# Patient Record
Sex: Male | Born: 1950 | Race: White | Hispanic: No | Smoking: Light tobacco smoker
Health system: Southern US, Community
[De-identification: ages and names within clinical notes are randomized; demographics above are authoritative.]

## PROBLEM LIST (undated history)

## (undated) DIAGNOSIS — E785 Hyperlipidemia, unspecified: Secondary | ICD-10-CM

## (undated) DIAGNOSIS — C801 Malignant (primary) neoplasm, unspecified: Secondary | ICD-10-CM

## (undated) DIAGNOSIS — M545 Low back pain, unspecified: Secondary | ICD-10-CM

## (undated) DIAGNOSIS — F419 Anxiety disorder, unspecified: Secondary | ICD-10-CM

## (undated) DIAGNOSIS — J449 Chronic obstructive pulmonary disease, unspecified: Secondary | ICD-10-CM

## (undated) DIAGNOSIS — K219 Gastro-esophageal reflux disease without esophagitis: Secondary | ICD-10-CM

## (undated) DIAGNOSIS — G8929 Other chronic pain: Secondary | ICD-10-CM

## (undated) DIAGNOSIS — F5104 Psychophysiologic insomnia: Secondary | ICD-10-CM

## (undated) DIAGNOSIS — Z89511 Acquired absence of right leg below knee: Secondary | ICD-10-CM

## (undated) HISTORY — DX: Gastro-esophageal reflux disease without esophagitis: K21.9

## (undated) HISTORY — DX: Hyperlipidemia, unspecified: E78.5

---

## 2004-05-30 ENCOUNTER — Ambulatory Visit (HOSPITAL_COMMUNITY): Admission: RE | Admit: 2004-05-30 | Discharge: 2004-05-30 | Payer: Self-pay | Admitting: *Deleted

## 2005-12-25 ENCOUNTER — Ambulatory Visit (HOSPITAL_COMMUNITY): Admission: RE | Admit: 2005-12-25 | Discharge: 2005-12-25 | Payer: Self-pay | Admitting: Family Medicine

## 2006-01-29 ENCOUNTER — Ambulatory Visit (HOSPITAL_COMMUNITY): Admission: RE | Admit: 2006-01-29 | Discharge: 2006-01-29 | Payer: Self-pay | Admitting: Family Medicine

## 2006-02-04 ENCOUNTER — Ambulatory Visit (HOSPITAL_COMMUNITY): Admission: RE | Admit: 2006-02-04 | Discharge: 2006-02-04 | Payer: Self-pay | Admitting: Family Medicine

## 2006-02-12 ENCOUNTER — Ambulatory Visit (HOSPITAL_COMMUNITY): Admission: RE | Admit: 2006-02-12 | Discharge: 2006-02-12 | Payer: Self-pay | Admitting: Pulmonary Disease

## 2006-04-15 ENCOUNTER — Inpatient Hospital Stay (HOSPITAL_COMMUNITY): Admission: AD | Admit: 2006-04-15 | Discharge: 2006-04-17 | Payer: Self-pay | Admitting: Family Medicine

## 2006-04-16 ENCOUNTER — Ambulatory Visit: Payer: Self-pay | Admitting: Internal Medicine

## 2006-05-02 ENCOUNTER — Ambulatory Visit (HOSPITAL_COMMUNITY): Admission: RE | Admit: 2006-05-02 | Discharge: 2006-05-02 | Payer: Self-pay | Admitting: Family Medicine

## 2007-03-07 ENCOUNTER — Ambulatory Visit (HOSPITAL_COMMUNITY): Admission: RE | Admit: 2007-03-07 | Discharge: 2007-03-07 | Payer: Self-pay | Admitting: Neurology

## 2007-04-30 ENCOUNTER — Emergency Department (HOSPITAL_COMMUNITY): Admission: EM | Admit: 2007-04-30 | Discharge: 2007-04-30 | Payer: Self-pay | Admitting: Emergency Medicine

## 2008-04-08 ENCOUNTER — Ambulatory Visit: Payer: Self-pay | Admitting: Internal Medicine

## 2008-07-07 ENCOUNTER — Encounter (INDEPENDENT_AMBULATORY_CARE_PROVIDER_SITE_OTHER): Payer: Self-pay | Admitting: Urology

## 2008-08-02 ENCOUNTER — Ambulatory Visit: Payer: Self-pay | Admitting: Internal Medicine

## 2009-07-03 ENCOUNTER — Encounter: Payer: Self-pay | Admitting: Emergency Medicine

## 2009-07-03 ENCOUNTER — Inpatient Hospital Stay (HOSPITAL_COMMUNITY): Admission: EM | Admit: 2009-07-03 | Discharge: 2009-07-05 | Payer: Self-pay | Admitting: General Surgery

## 2009-08-05 ENCOUNTER — Ambulatory Visit (HOSPITAL_COMMUNITY): Admission: RE | Admit: 2009-08-05 | Discharge: 2009-08-05 | Payer: Self-pay | Admitting: Family Medicine

## 2011-03-10 LAB — COMPREHENSIVE METABOLIC PANEL
AST: 19 U/L (ref 0–37)
Albumin: 4 g/dL (ref 3.5–5.2)
Calcium: 9.1 mg/dL (ref 8.4–10.5)
Creatinine, Ser: 0.75 mg/dL (ref 0.4–1.5)
GFR calc Af Amer: >60 mL/min (ref >60)
GFR calc non Af Amer: >60 mL/min (ref >60)
Total Protein: 7.9 g/dL (ref 6.0–8.3)

## 2011-03-10 LAB — CBC
MCHC: 35.8 g/dL (ref 30.0–36.0)
MCV: 98.5 fL (ref 78.0–100.0)
Platelets: 266 10*3/uL (ref 150–400)
RDW: 12.8 % (ref 11.5–15.5)

## 2011-03-10 LAB — URINALYSIS, ROUTINE W REFLEX MICROSCOPIC
Bilirubin Urine: NEGATIVE
Nitrite: NEGATIVE
Protein, ur: NEGATIVE mg/dL
Urobilinogen, UA: 0.2 mg/dL (ref 0.0–1.0)

## 2011-03-10 LAB — RAPID URINE DRUG SCREEN, HOSP PERFORMED
Amphetamines: NOT DETECTED
Barbiturates: NOT DETECTED
Benzodiazepines: POSITIVE — AB
Opiates: NOT DETECTED
Tetrahydrocannabinol: POSITIVE — AB

## 2011-03-10 LAB — DIFFERENTIAL
Eosinophils Relative: 3 % (ref 0–5)
Lymphocytes Relative: 40 % (ref 12–46)
Lymphs Abs: 3.9 10*3/uL (ref 0.7–4.0)
Monocytes Relative: 6 % (ref 3–12)

## 2011-03-11 LAB — CBC
Hemoglobin: 15.6 g/dL (ref 13.0–17.0)
MCHC: 35 g/dL (ref 30.0–36.0)
MCV: 99.8 fL (ref 78.0–100.0)
RBC: 4.46 MIL/uL (ref 4.22–5.81)

## 2011-03-11 LAB — BASIC METABOLIC PANEL
CO2: 25 mEq/L (ref 19–32)
Chloride: 104 mEq/L (ref 96–112)
Creatinine, Ser: 0.7 mg/dL (ref 0.4–1.5)
GFR calc Af Amer: >60 mL/min (ref >60)

## 2011-03-11 LAB — PROTIME-INR: Prothrombin Time: 14.4 seconds (ref 11.6–15.2)

## 2011-04-17 NOTE — H&P (Signed)
NAME:  [name], [name] NO.:  [id number]   MEDICAL RECORD NO.:  [id number]         PATIENT TYPE:  AMB   LOCATION:  DAY                           FACILITY:  [facility]   PHYSICIAN:  R. [name], M.D. DATE OF BIRTH:  12/14/1950   DATE OF ADMISSION:  DATE OF DISCHARGE:  LH                              HISTORY & PHYSICAL   REFERRING PHYSICIAN:  Scott A. Gerald Diss, MD   REASON FOR CONSULTATION:  Severe proctalgia/followup ulcerative/erosive  esophagitis.   HISTORY OF PRESENT ILLNESS:  [name] is a 60 year old Caucasian male  who we initially saw back in May 2007, when he had GI bleeding from  florid inflammatory changes of the distal one-half of the tubular  esophagus.  He was supposed to return for further evaluation of his  ulcerative and erosive esophagitis to ensure complete healing on PPI.  However, he did not return to our office.  We are also planning on doing  a screening colonoscopy as [name] had never had one and again, we  contacted him on August 07, 2006, and he told us that he did not want  to have the colonoscopy done at that time.  He was actually scheduled in  November 2007, but for whatever reason ever, but on October 01, 2006, he  called and cancelled the EGD and colonoscopy as he did not want to have  that done.  For the last 6 weeks, he has had severe proctalgia.  He  feels a pressure in his rectum as well as in his bladder.  It is  intermittent.  He tells me, he has a constant sensation of having to go  to the bathroom and have a bowel movement as well as urinate.  He has  been referred to Dr. Jerre Kim for prostatomegaly.  He is undergoing  further testing by him.  He tells me he has lost about 7 pounds in the  last 6 weeks.  He denies any diarrhea or constipation.  Denies any  abdominal pain.  Denies any rectal bleeding or melena.  Denies any  nausea, vomiting, or anorexia.  Denies any dysphagia, odynophagia, or  early satiety.  He denies any  fever or chills.  He does have occasional  heartburn and indigestion.  He is not on PPI at this time.  He notes his  GERD is worse when he consumes coffee.   PAST MEDICAL AND SURGICAL HISTORY:  1. He had hematemesis due to florid ulcerative, erosive, reflux      esophagitis of the distal one-half of his esophagus, which required      hospitalization on Apr 16, 2006.  2. He has a history of migraine headaches.  3. COPD.  4. Diabetes mellitus.  5. Hypertension.  6. 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  7. Chronic fatigue.  8. PTSD.  9. Prostatomegaly/prostate nodule being referred to Dr. Jerre Kim.  10.Insomnia.   CURRENT MEDICATIONS:  Pamelor 100 mg nightly, Xanax 0.5 mg q.i.d.,  Vicodin 5/500 mg p.r.n., Tylenol p.r.n.,  and Zestril 2.5 mg daily.   ALLERGIES:  ANTIHISTAMINES.   FAMILY HISTORY:  There is no known family history of carcinoma or other  chronic GI problems.  Mother has mental/psychiatric problems as well  as back problems.  Father deceased at 6 secondary to lung carcinoma and  diabetes mellitus.  He has 6 healthy sisters.   SOCIAL HISTORY:  [name] is divorced.  He has 2 healthy children.  He  is retired permanently disabled.  He has a 37-pack-year history of  tobacco use.  Denies any alcohol.  He has remote history of marijuana  use.   REVIEW OF SYSTEMS:  See HPI.  GU:  He does have increased urinary  frequency as well as dysuria, prostate nodule recently been evaluated by  Urology.  Otherwise, negative review of systems see HPI.   PHYSICAL EXAMINATION:  VITAL SIGNS:  Weight of 163 pounds, height 72  inches, temperature 97.8, blood pressure 100/70, and pulse 68.  GENERAL:  [name] is a few 60 year old Caucasian male who is alert,  oriented, pleasant, cooperative, in no acute distress.  HEENT:  Pupils equal, round, reactive to light.  Anicteric sclerae.  Conjunctivae pink.  Oropharynx pink and moist without  any lesions.  NECK:  Supple without thyromegaly.  CHEST:  Heart regular rate and rhythm.  Normal S1 and S2.  No murmurs,  clicks, rubs, or gallops.  LUNGS:  Clear to auscultation bilaterally.  ABDOMEN:  Positive bowel sounds x4.  No bruits auscultated.  Soft,  nontender, and nondistended without palpable mass or hepatosplenomegaly.  No rebound, tenderness, or guarding.  EXTREMITIES:  He has a left lower extremity without clubbing or edema.  He has a right below-the-knee amputation.  RECTAL:  No external lesions visualized.  Good sphincter tone.  He does  have marked prostatomegaly.  Small amount of stool was obtained from the  bowel, which is brown and Hemoccult negative.   IMPRESSION:  [name] is a 60 year old Caucasian male with severe  proctalgia.  Nothing on exam to explain his symptoms, other than marked  prostatomegaly, for which he is being followed by Urology.  Given his  history of severe ulcerative/erosive reflux esophagitis, he never had  followup and is due for repeat esophagogastroduodenoscopy to assess  healing.  He also needs a screening colonoscopy.   PLAN:  1. I have offered [name], colonoscopy and EGD at the same time by      Dr. Jena Kim at [facility].  I have gone over risks and      benefits, which include but not limited to bleeding, infection,      perforation, and drug reaction.  He does not want to further pursue      either procedure at this time.  He is very concerned about      colonoscopy prep.  He tells me, he is not wanting to pursue any      further invasive testing at this point in his life.  2. I have suggested that he follow through with his Urology testing      and if he changes his mind regarding EGD and colonoscopy.  He is      going to give [place] a call.   Thank you Dr. Gerda Kim for allowing [place] to participate in care of Mr.  [name].      Lorenza Burton, N.P.      Jonathon Bellows, M.D.  Electronically Signed    KJ/MEDQ  D:   04/08/2008  T:  04/09/2008  Job:  [id number]   cc:   Lorin Picket A. Gerald Diss, MD  Fax: [phone]

## 2011-04-17 NOTE — H&P (Signed)
NAME:  LOYS, [name] NO.:  [id number]   MEDICAL RECORD NO.:  [id number]          PATIENT TYPE:  INP   LOCATION:  3106                         FACILITY:  [facility]   PHYSICIAN:  Clovis Pu. Cornett, M.D.DATE OF BIRTH:  05/31/1951   DATE OF ADMISSION:  07/03/2009  DATE OF DISCHARGE:                              HISTORY & PHYSICAL   PRIMARY CARE PHYSICIAN:  Dr. Lorin Picket Long   CHIEF COMPLAINT:  Fall.   HISTORY OF PRESENT ILLNESS:  The patient is a 60 year old white male who  fell ground-level and was found down outside his apartment.  Brief loss  of conscious.  He was amnestic for the event.  He woke up in the  hospital.  He was found to have a small right subdural hematoma and  transferred to [facility] from [facility].  He has been drinking  tonight.   PAST MEDICAL HISTORY:  1. Peptic ulcer disease.  2. Posttraumatic stress disorder.  3. Chronic fatigue.  4. Migraine headaches.   PAST SURGICAL HISTORY:  Right below-knee amputation from an accident in  1984.   SOCIAL HISTORY:  Denies drug use but is positive for marijuana in his  urine.  Tobacco use, yes and alcohol use is significant.  He lives by  himself.  He is divorced.   ALLERGIES:  BENADRYL.   MEDICATIONS:  1. Xanax 0.5 mg q.i.d.  2. Pamelor 150 at bedtime p.r.n.  3. Zestril.  4. Vicodin as needed for pain.   FAMILY HISTORY:  Noncontributory.   REVIEW OF SYSTEMS:  Please see above, has chief complaint of headache,  otherwise negative x15.   PHYSICAL EXAMINATION:  VITAL SIGNS:  Pulse 106 and blood pressure  123/80.  HEENT:  Small laceration just above his left eye and abrasions to his  left face.  Pupils are equal, round, reactive to light.  No evidence of  jaundice.  Tympanic membranes are clear.  NECK:  Soft, supple, and nontender.  Trachea midline.  Full range of  motion.  PULMONARY:  Clear to auscultation.  Chest wall motion is normal.  CARDIOVASCULAR:  Regular rate and rhythm without rub,  murmur, or gallop.  ABDOMEN:  Soft and nontender with no rebound, guarding, or mass.  PELVIC:  Stable.  EXTREMITIES:  Warm, well perfused.  Previous right BKA noted.  BACK:  Normal.  NEUROLOGIC:  He has some mild left perioral weakness.  His Glasgow Coma  scale is 14.  He is repetitive.  Motion, labile.   LABORATORY DATA:  Positive for benzodiazepines.  Positive for marijuana.  White count 9900, hemoglobin is 17, and platelet count 226,000.  Sodium  135, potassium 3.4, chloride 97, CO2 of 25, BUN 7, creatinine 0.75,  glucose 116.   Cranial CT shows a very small right subdural hematoma and sinusitis.  The CT of his neck is normal.   IMPRESSION:  Fall with small right subdural hematoma, left eyebrow  laceration, facial erosions, posttraumatic stress disorder,  hypertension, peptic ulcer disease, alcohol abuse, tobacco abuse,  substance abuse.   PLAN:  We will have Neurosurgery see him  and repair his eye laceration.      Thomas A. Cornett, M.D.  Electronically Signed     TAC/MEDQ  D:  07/03/2009  T:  07/03/2009  Job:  [id number]

## 2011-04-17 NOTE — H&P (Signed)
NAME:  BARAN, KUHRT NO.:  [id number]   MEDICAL RECORD NO.:  [id number]         PATIENT TYPE:  AMB   LOCATION:  DAY                           FACILITY:  [facility]   PHYSICIAN:  R. Roetta Sessions, M.D. DATE OF BIRTH:  1951/09/03   DATE OF ADMISSION:  DATE OF DISCHARGE:  LH                              HISTORY & PHYSICAL   REFERRING PHYSICIAN:  Scott A. Gerda Diss, MD   REASON FOR CONSULTATION:  Severe proctalgia/followup ulcerative/erosive  esophagitis.   HISTORY OF PRESENT ILLNESS:  Mr. Meester is a 60 year old Caucasian male  who we initially saw back in May 2007, when he had GI bleeding from  florid inflammatory changes of the distal one-half of the tubular  esophagus.  He was supposed to return for further evaluation of his  ulcerative and erosive esophagitis to ensure complete healing on PPI.  However, he did not return to our office.  We are also planning on doing  a screening colonoscopy as Mr. Melhorn had never had one and again, we  contacted him on August 07, 2006, and he told us that he did not want  to have the colonoscopy done at that time.  He was actually scheduled in  November 2007, but for whatever reason ever, but on October 01, 2006, he  called and cancelled the EGD and colonoscopy as he did not want to have  that done.  For the last 6 weeks, he has had severe proctalgia.  He  feels a pressure in his rectum as well as in his bladder.  It is  intermittent.  He tells me, he has a constant sensation of having to go  to the bathroom and have a bowel movement as well as urinate.  He has  been referred to Dr. Jerre Simon for prostatomegaly.  He is undergoing  further testing by him.  He tells me he has lost about 7 pounds in the  last 6 weeks.  He denies any diarrhea or constipation.  Denies any  abdominal pain.  Denies any rectal bleeding or melena.  Denies any  nausea, vomiting, or anorexia.  Denies any dysphagia, odynophagia, or  early satiety.  He denies any  fever or chills.  He does have occasional  heartburn and indigestion.  He is not on PPI at this time.  He notes his  GERD is worse when he consumes coffee.   PAST MEDICAL AND SURGICAL HISTORY:  1. He had hematemesis due to florid ulcerative, erosive, reflux      esophagitis of the distal one-half of his esophagus, which required      hospitalization on Apr 16, 2006.  2. He has a history of migraine headaches.  3. COPD.  4. Diabetes mellitus.  5. Hypertension.  6. 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  7. Chronic fatigue.  8. PTSD.  9. Prostatomegaly/prostate nodule being referred to Dr. Jerre Simon.  10.Insomnia.   CURRENT MEDICATIONS:  Pamelor 100 mg nightly, Xanax 0.5 mg q.i.d.,  Vicodin 5/500 mg p.r.n., Tylenol p.r.n.,  and Zestril 2.5 mg daily.   ALLERGIES:  ANTIHISTAMINES.   FAMILY HISTORY:  There is no known family history of carcinoma or other  chronic GI problems.  Mother has mental/psychiatric problems as well  as back problems.  Father deceased at 75 secondary to lung carcinoma and  diabetes mellitus.  He has 6 healthy sisters.   SOCIAL HISTORY:  Mr. Delacruz is divorced.  He has 2 healthy children.  He  is retired permanently disabled.  He has a 37-pack-year history of  tobacco use.  Denies any alcohol.  He has remote history of marijuana  use.   REVIEW OF SYSTEMS:  See HPI.  GU:  He does have increased urinary  frequency as well as dysuria, prostate nodule recently been evaluated by  Urology.  Otherwise, negative review of systems see HPI.   PHYSICAL EXAMINATION:  VITAL SIGNS:  Weight of 163 pounds, height 72  inches, temperature 97.8, blood pressure 100/70, and pulse 68.  GENERAL:  Mr. Kainz is a few 60 year old Caucasian male who is alert,  oriented, pleasant, cooperative, in no acute distress.  HEENT:  Pupils equal, round, reactive to light.  Anicteric sclerae.  Conjunctivae pink.  Oropharynx pink and moist without  any lesions.  NECK:  Supple without thyromegaly.  CHEST:  Heart regular rate and rhythm.  Normal S1 and S2.  No murmurs,  clicks, rubs, or gallops.  LUNGS:  Clear to auscultation bilaterally.  ABDOMEN:  Positive bowel sounds x4.  No bruits auscultated.  Soft,  nontender, and nondistended without palpable mass or hepatosplenomegaly.  No rebound, tenderness, or guarding.  EXTREMITIES:  He has a left lower extremity without clubbing or edema.  He has a right below-the-knee amputation.  RECTAL:  No external lesions visualized.  Good sphincter tone.  He does  have marked prostatomegaly.  Small amount of stool was obtained from the  bowel, which is brown and Hemoccult negative.   IMPRESSION:  Mr. Dragone is a 60 year old Caucasian male with severe  proctalgia.  Nothing on exam to explain his symptoms, other than marked  prostatomegaly, for which he is being followed by Urology.  Given his  history of severe ulcerative/erosive reflux esophagitis, he never had  followup and is due for repeat esophagogastroduodenoscopy to assess  healing.  He also needs a screening colonoscopy.   PLAN:  1. I have offered Mr. Snee, colonoscopy and EGD at the same time by      Dr. Jena Gauss at [facility].  I have gone over risks and      benefits, which include but not limited to bleeding, infection,      perforation, and drug reaction.  He does not want to further pursue      either procedure at this time.  He is very concerned about      colonoscopy prep.  He tells me, he is not wanting to pursue any      further invasive testing at this point in his life.  2. I have suggested that he follow through with his Urology testing      and if he changes his mind regarding EGD and colonoscopy.  He is      going to give [place] a call.   Thank you Dr. Gerda Diss for allowing [place] to participate in care of Mr.  Dettman.      Lorenza Burton, N.P.      Jonathon Bellows, M.D.  Electronically Signed    KJ/MEDQ  D:   04/08/2008  T:  04/09/2008  Job:  [id number]   cc:   Lorin Picket A. Gerda Diss, MD  Fax: [phone]

## 2011-04-17 NOTE — H&P (Signed)
NAME:  OVID, WITMAN NO.:  [id number]   MEDICAL RECORD NO.:  [id number]          PATIENT TYPE:  AMB   LOCATION:  DAY                           FACILITY:  [facility]   PHYSICIAN:  R. Roetta Sessions, M.D. DATE OF BIRTH:  [birth year]   DATE OF ADMISSION:  DATE OF DISCHARGE:  LH                              HISTORY & PHYSICAL   PRIMARY CARE GASTROENTEROLOGIST:  Jonathon Bellows, MD.   PRIMARY CARE PHYSICIAN:  Scott A. Gerda Diss, MD.   CHIEF COMPLAINT:  Hematemesis.   HISTORY OF PRESENT ILLNESS:  Mr. Mavis is a 60 year old Caucasian male.  Approximately 10 days ago, he noticed severe fatigue.  He awakened about  8:00 a.m.  He began to have nausea and vomiting.  When he began  vomiting, he noticed that there was moderate-to-large amount of bright  red blood.  He continued to vomit throughout the day and then it  subsided.  He was seen by Dr. Lilyan Punt.  He did have a hemoglobin of  15.7 according to Dr. Fletcher Anon records.  He was started on Protonix 40  mg daily.  He is complaining of some left upper quadrant pain.  It is  intermittent, generally lasts a few seconds.  He denies any aspirin or  NSAID use.  He denies any alcohol use specifically.   He does have history of hematemesis due to __________ ulcerative erosive  esophagitis in the distal one-half of his esophagus, which required  hospitalization in May 2007.  It was recommended that he have a followup  EGD and a screening colonoscopy in May 2009; however, he did decline  both exams.   PAST MEDICAL AND SURGICAL HISTORY:  1. He has history of hematemesis due to __________ ulcerative erosive      esophagitis in the distal one-half of the esophagus which required      hospitalization on Apr 16, 2006, and EGD by Dr. Jena Gauss.  2. He has history of migraine headaches.  3. COPD.  4. Diabetes mellitus.  5. Hypertension.  6. Right below-the-knee amputation due to an oil pan falling on his      right lower leg.  7. Chronic  fatigue.  8. PTSD.  9. Prostatomegaly and prostate nodule, which reportedly was benign.      He has been followed by Dr. Jerre Simon.  10.Insomnia.   CURRENT MEDICATIONS:  1. Pamelor 100 mg q.h.s.  2. Xanax 0.5 mg q.i.d.  3. Vicodin 5/500 mg p.r.n.  4. Tylenol p.r.n.  5. Zestril 2.5 mg daily.  6. Protonix 40 mg daily.   ALLERGIES:  Questionable ANTIHISTAMINES.   FAMILY HISTORY:  There is no family history of colorectal carcinoma or  liver or chronic GI problems.  Mother has mental psychiatric problems  as well as back problems.  Father deceased at 72 secondary to lung  carcinoma and diabetes mellitus.  He has 6 healthy sisters.   SOCIAL HISTORY:  Mr. Ratz is divorced.  He has 2 healthy children.  He  is permanently disabled.  He has a 39-pack-year history of tobacco  abuse.  Admits  to consuming a couple of beers and a glass of liquor a  couple of times per year.  He has remote history of marijuana use.  Denies any current drug use.   REVIEW OF SYSTEMS:  See HPI; otherwise, negative.   PHYSICAL EXAMINATION:  VITAL SIGNS:  Weight 156 pounds, height 72  inches, temperature 98.5, blood pressure 118/80, pulse 84.  GENERAL:  He is an alert, oriented, and anxious-appearing Caucasian male  in no acute distress.  HEENT:  Conjunctivae are clear.  Nonicteric sclerae; conjunctivae pink.  Oropharynx pink and moist without lesions.  NECK:  Supple without mass or thyromegaly.  CHEST:  Heart, regular rate and rhythm.  Normal S1 and S2.  No murmurs,  clicks, rubs, or gallops.  LUNGS:  Clear to auscultation bilaterally.  ABDOMEN:  Positive bowel sounds x4.  No bruits auscultated.  Soft,  nontender, and nondistended without palpable mass or hepatosplenomegaly.  No rebound tenderness or guarding.  EXTREMITIES:  Left lower extremity without clubbing or edema.  Right is  with below-the-knee amputation.  RECTAL:  Deferred.   IMPRESSION:  Mr. Shelnutt is a 60-year Caucasian male with history of   severe ulcerative erosive esophagitis who had a less than 24-hour  history of large-volume hematemesis with a stable hemoglobin.  Given his  history of ulcerative esophagitis, I suspect he may have the same.  I  think he may be downplaying his amount of alcohol consumption.  He is on  daily proton pump inhibitor and has been not noticed any further  gastroesophageal reflux disease symptoms on proton pump inhibitor other  than a mild left upper quadrant dyspepsia.  Differentials include  erosive ulcerative esophagitis, peptic ulcer disease, and Mallory-Weiss  tear.   PLAN:  1. EGD and screening colonoscopy with Dr. Jena Gauss in the near future in      the OR with propofol.  2. CBC, amylase, lipase, LFTs, and MET 7.  3. Continue Protonix 40 mg daily.  4. Further recommendations pending procedures.      Lorenza Burton, N.P.      Jonathon Bellows, M.D.  Electronically Signed    KJ/MEDQ  D:  08/02/2008  T:  08/03/2008  Job:  [id number]   cc:   Lorin Picket A. Gerda Diss, MD  Fax: [phone]

## 2011-04-17 NOTE — Consult Note (Signed)
NAME:  [name], BAMBER NO.:  [id number]   MEDICAL RECORD NO.:  [id number]          PATIENT TYPE:  INP   LOCATION:  3106                         FACILITY:  [facility]   PHYSICIAN:  Hilda Lias, M.D.   DATE OF BIRTH:  05-Apr-1951   DATE OF CONSULTATION:  07/03/2009  DATE OF DISCHARGE:                                 CONSULTATION   HISTORY:  Mr. Berg is a 60 year old gentleman who was found outside his  apartment with no history of what happened with a possibility of loss of  consciousness.  He was taken to [facility] early in the  morning.  He had a CT scan we were called because small finding of  subdural hematoma.  The patient was transferred.  At the present time,  he is awake and oriented x3.  His alcohol level is increased.  He has no  weakness.  His cranial nerves are normal.  The CT scan showed tiny  convexity, a small subdural hematoma with no shift.   CLINICAL IMPRESSION:  Closed head injury.  Alcohol intoxication, small  subdural hematoma.   RECOMMENDATIONS:  The patient is going to keep in the intensive care  unit and he is going to have a repeat CT scan in 24 hours.           ______________________________  Hilda Lias, M.D.     EB/MEDQ  D:  07/03/2009  T:  07/03/2009  Job:  [id number]

## 2011-04-17 NOTE — Discharge Summary (Signed)
NAME:  [name], MATSUO NO.:  [id number]   MEDICAL RECORD NO.:  [id number]          PATIENT TYPE:  INP   LOCATION:  3028                         FACILITY:  [facility]   PHYSICIAN:  Gabrielle Dare. Janee Morn, M.D.DATE OF BIRTH:  28-Jul-1951   DATE OF ADMISSION:  07/03/2009  DATE OF DISCHARGE:  07/05/2009                               DISCHARGE SUMMARY   CONSULTATIONS:  Hilda Lias, MD, Neurosurgery   DISCHARGE DIAGNOSES:  1. Fall from level ground.  2. Traumatic brain injury with a very small right subdural hematoma.  3. Facial lacerations and abrasions.  4. Posttraumatic stress disorder.  5. Chronic fatigue syndrome.  6. Remote right below-the-knee amputation.  7. Ethanol abuse.  8. Tobacco abuse.  9. Peptic ulcer disease.  10.History of migraines.   HISTORY ON ADMISSION:  This is a 60 year old male with multiple medical  problems, a patient of Dr. Lilyan Punt in [place] who apparently was  found in the parking lot outside his apartment on the ground.  He had a  brief loss of consciousness and was amnesic for the event.  He reports  that he woke up in the hospital.  He apparently had several drinks prior  to this at a friend's house and reports he remembers going home but does  not remember anything following that until he did awaken in the [facility].   He was worked up at [facility] and had a head CT scan which showed a  very small right subdural hematoma and some sinusitis.  C-spine CT scan  was negative for any fractures.  He was transferred to [facility] for observation.  He was seen by Dr. Hilda Lias in the ICU  and was felt he should have a followup CT in the following morning.  This was done and showed stable, very small right subdural with no  evidence for shift.  The patient did well neurologically and was able to  progress to ambulation at his previous functional level.  He was  tolerating a regular diet and is prepared for  discharge home.   DISCHARGE MEDICATIONS:  1. Xanax 0.5 mg p.o. q.i.d.  2. Zestril 5 mg 1/2 pill p.o. daily.  3. Pamelor 150 mg p.o. at bedtime.  4. Vicodin 5/500 two tablets p.o. b.i.d. p.r.n. pain.   These are his usual medications and we are not adding any additional  medications into his regimen.   The patient does not need formal followup with the Trauma Service or Dr.  Jeral Fruit, but should follow up with his primary care physician, Dr. Gerda Diss  as directed.      Shawn Rayburn, P.A.      Gabrielle Dare Janee Morn, M.D.  Electronically Signed    SR/MEDQ  D:  07/05/2009  T:  07/05/2009  Job:  [id number]   cc:   Lorin Picket A. Gerda Diss, MD  Hilda Lias, M.D.  [facility]

## 2011-04-20 NOTE — Consult Note (Signed)
NAME:  MAKAEL, STEIN NO.:  [id number]   MEDICAL RECORD NO.:  [id number]          PATIENT TYPE:  INP   LOCATION:  A218                          FACILITY:  [facility]   PHYSICIAN:  R. Roetta Sessions, M.D. DATE OF BIRTH:  1951/06/10   DATE OF CONSULTATION:  DATE OF DISCHARGE:                                   CONSULTATION   REASON FOR CONSULTATION:  Hematemesis.   HISTORY OF PRESENT ILLNESS:  Mr. Shean is a 60 year old Caucasian male who  states that approximately one week ago he felt weak and lightheaded.  He  then developed hematemesis.  He had three large-volume bloody emesis with  some clots noted.  He remained at home.  The following day, he had two  further episodes of hematemesis.  He complained of flu-like symptoms, body  aches, low-grade temperature, and a squeezing pain to his upper abdomen  which was intermittent.  He also had two large-volume watery stools at that  time.  He complained of diaphoresis as well.  Since then, he has had no  further bleeding.  He has had refractory heartburn and indigestion.  He has  had some odynophagia but denies any dysphagia.  Otherwise, his bowel  movements returned to normal.  He generally has a soft brown bowel movement  everyday.  He had been taking ibuprofen 200 to 400 mg q.5-6h. for his upper  abdominal pain as well.  He was seen by Dr. Gerda Diss in the office and was  found to have a hemoglobin of 9 on fingerstick.  He has never had an EGD or  colonoscopy previously.   PAST MEDICAL HISTORY:  1.  Migraine headaches.  2.  COPD.  3.  Diabetes mellitus.  4.  Hypertension.  5.  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   MEDICATIONS PRIOR TO ADMISSION:  1.  Tylenol p.r.n.  2.  Ibuprofen p.r.n.  3.  Xanax 0.5 mg q.i.d.  4.  __________ 75 mg three p.o. q.h.s.  5.  Restoril 15 mg three p.o. q.h.s.  6.  Vicodin 5/500 mg p.o. q.4-6h. p.r.n.   ALLERGIES:  BENADRYL CAUSES  JITTERINESS.   FAMILY HISTORY:  Positive for mother at age 67 with IBS.  Father deceased at  age 45 secondary to lung carcinoma.  He was a smoker.  He also lost a sister  due to lung carcinoma.  He has four healthy sisters.   SOCIAL HISTORY:  Mr. Seymore is currently divorced.  He lives alone.  He has  one son and daughter who are relatively healthy.  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  He  has a 35-pack-year history of tobacco use.  He rarely consumes alcohol per  his report.  He has a remote history of marijuana use.   REVIEW OF SYSTEMS:  CONSTITUTIONAL:  He reports his weight is down about 7  pounds in the last week.  He has had some low-grade fever and chills as well  as anorexia.  CARDIOVASCULAR:  Denies any chest  pain or palpitations.  PULMONARY:  He notes his cough is much better.  He denies any shortness of  breath.  Denies any hemoptysis.  GI:  See HPI.  GU:  He denies any dysuria,  hematuria, increased urinary frequency.   PHYSICAL EXAMINATION:  VITAL SIGNS:  Weight 155 pounds.  Temperature 98.5,  pulse 101, respirations 20, blood pressure 111/70.  GENERAL:  Mr. Quintero is a 60 year old thin, pale Caucasian male who is alert  and oriented, pleasant, and cooperative, and in no acute distress.  He is  accompanied by his sister today.  HEENT:  Sclerae clear, nonicteric.  Conjunctivae pink.  Oropharynx pink and  moist without any lesions.  NECK:  Supple without any mass or thyromegaly.  CHEST:  Heart respiratory rate with a normal S1 and S2.  No murmurs, clicks,  rubs, or gallops.  LUNGS:  Clear to auscultation bilaterally.  ABDOMEN:  Positive bowel sounds x4.  No bruits auscultated.  Soft,  nontender, nondistended.  No palpable masses or hepatosplenomegaly.  No  rebound tenderness or guarding.  RECTAL:  Deferred.  EXTREMITIES:  Left lower extremity without clubbing or edema.  He does have  a large papule which is hard and flesh-toned to his left foot.  He has a  right below knee  amputation.  SKIN:  Pale, warm, and dry without rash or jaundice.   LABORATORY DATA:  Unavailable at this time.   IMPRESSION:  Mr. Stavola is a 60 year old Caucasian male with a history of  hematemesis about a week ago with four episodes of large-volume bleeding per  his report.  He complains of weakness, fatigue, upper abdominal pain.  He  consumed ibuprofen on a regular basis throughout the last week.  He is also  complaining of low-grade fever, flu-like symptoms, and a short course of  diarrhea.  He has complained of refractory heartburn and indigestion as well  as odynophagia.  His hemoglobin was 9 at Dr. Fletcher Anon office.  He denies any  significant alcohol consumption.   Mr. Streater has history of upper gastrointestinal bleed possibly secondary to  peptic ulcer disease given nonsteroidal anti-inflammatory drug use, erosive  reflux esophagitis, Mallory-Weiss tear, or esophageal varices.  We will need  esophagogastroduodenoscopy to further evaluate.  Would recommend outpatient  colonoscopy as well given his age.   PLAN:  1.  Nothing by mouth after midnight.  2.  For EGD with Dr. Jena Gauss tomorrow.  3.  I have discussed this procedure, including the risks and benefits which      include but are not limited to bleeding, infection, perforation.  He      agrees with the plan, and consent has been obtained.  4.  Agree with __________.  5.  Outpatient colonoscopy.  6.  Follow up H&H in the morning.   We would like to thank Dr. Gerda Diss for allowing [place] to participate in the care  of Mr. Markey.      Nicholas Lose, N.P.      Jonathon Bellows, M.D.  Electronically Signed    KC/MEDQ  D:  04/15/2006  T:  04/16/2006  Job:  [id number]   cc:   Lorin Picket A. Gerda Diss, MD  Fax: [phone]

## 2011-04-20 NOTE — Procedures (Signed)
NAME:  HECTOR, TAFT NO.:  [id number]   MEDICAL RECORD NO.:  [id number]          PATIENT TYPE:  OUT   LOCATION:                                FACILITY:  [facility]   PHYSICIAN:  Edward L. Juanetta Gosling, M.D.DATE OF BIRTH:  04-06-1951   DATE OF PROCEDURE:  02/20/2006  DATE OF DISCHARGE:                              PULMONARY FUNCTION TEST   RESULTS:  1.  Spirometry shows airflow obstruction, but no definite ventilatory      defect.  Airflow obstruction is most marked in the smaller airways. 2.      Lung volumes are normal without evidence of restrictive change.  2.  DLCO was normal.  3.  Arterial blood gases are normal.  4.  There is no significant bronchodilator response.      Edward L. Juanetta Gosling, M.D.  Electronically Signed     ELH/MEDQ  D:  02/20/2006  T:  02/21/2006  Job:  [id number]

## 2011-04-20 NOTE — Op Note (Signed)
NAME:  [name], [name] NO.:  [id number]   MEDICAL RECORD NO.:  [id number]          PATIENT TYPE:  INP   LOCATION:  A218                          FACILITY:  [facility]   PHYSICIAN:  R. Roetta Sessions, M.D. DATE OF BIRTH:  22-Mar-1951   DATE OF PROCEDURE:  04/16/2006  DATE OF DISCHARGE:                                 OPERATIVE REPORT   INDICATIONS FOR PROCEDURE:  The patient is a 60 year old gentleman admitted  to the hospital with anemia in a setting of several-day history of  hematemesis.  He remains hemodynamically stable with hemoglobin of 9.3.  EGD  is now being performed.  This procedure has been discussed with the patient  at length.  Potential risks, benefits, and alternatives have been reviewed.  Questions answered and is agreeable.  Please see documentation in medical  record.   DESCRIPTION OF PROCEDURE:  O2 saturation, blood pressure, pulse were  monitored throughout the entire procedure.  Conscious sedation was Versed 4  mg IV, Demerol 100 mg IV in divided doses.  Instrument was Olympus video  system.   FINDINGS:  Examination of tubular esophagus revealed a florid inflammatory  changes of the distal 1/2 of the tubular esophagus.  Circumferential  ulcerations and erosions of the very distal esophagus with deep four  quadrant erosions extending halfway up the tubular esophagus.  Please see  photos.  There is also a soft noncritical peptic stricture (not manipulated  since the patient was denying dysphagia).  There was no obvious Barrett's or  neoplasm.  EG junction was easily traversed with the scope.   Stomach; the gastric cavity was emptied and insufflated well with air.  Thorough examination of the gastric mucosa was clear.  Retroflexion view of  the proximal stomach and esophagogastric junction demonstrated only a small  hiatal hernia.  Pylorus was patent and easily traversed.  Examination of the  bulb and second portion revealed no abnormalities.   THERAPIES/DIAGNOSTIC MANEUVERS PERFORMED:  None.   The patient tolerated the procedure well and was reactivated in endoscopy.   IMPRESSION:  1.  Florid inflammatory changes of the distal 1/2 of the tubular esophagus      consistent with ulcerative/erosive reflux esophagitis.  Soft peptic      stricture not manipulated.  2.  Small hiatal hernia, otherwise normal stomach.  3.  Normal D1 and D2.   RECOMMENDATIONS:  1.  Protonix 40 mg orally twice daily.  2.  Carafate 1 gram slurries q.i.d. x5 days.  3.  Low fat diet.  4.  Consider repeat EGD to look at his esophagus in approximately 3 months.      We could consider his first screening colonoscopy at that time as well.   Hopefully he will be able to go home in the next 24-48 hours.      Jonathon Bellows, M.D.  Electronically Signed     RMR/MEDQ  D:  04/16/2006  T:  04/17/2006  Job:  [id number]   cc:   Lorin Picket A. Gerda Diss, MD  Fax: [phone]

## 2011-04-20 NOTE — Discharge Summary (Signed)
NAME:  [name], [name] NO.:  [id number]   MEDICAL RECORD NO.:  [id number]          PATIENT TYPE:  INP   LOCATION:  A218                          FACILITY:  [facility]   PHYSICIAN:  Scott A. Gerda Diss, MD    DATE OF BIRTH:  1951/08/30   DATE OF ADMISSION:  04/15/2006  DATE OF DISCHARGE:  05/16/2007LH                                 DISCHARGE SUMMARY   DISCHARGE DIAGNOSES:  1.  Gastrointestinal bleed.  2.  Sever ulcerative erosive reflux esophagitis.  It was doubtful that      patient had Barrett's esophagus.  Hemoglobin stable and on date of      discharge actually came up to 9.8.  Patient was considered stable but he      was advised to avoid all NSAIDs.  He was advised to quit smoking (which      is unlikely according to the patient).  He was advised to stay away from      all alcohol.  Will go with Protonix 40 mg twice a day and then also      Carafate two teaspoons q.i.d. for five days.  He was instructed to      follow up with [place] in approximately two weeks and instructed to follow up      with Dr. Karilyn Cota and Dr. Jena Gauss in approximately two to three months and      they would repeat an esophagogastroduodenoscopy and if he has recurrence      of problems he is to let us know.  He is to resume his usual medicines      with the above restrictions and to follow up sooner if any particular      problems.      Scott A. Gerda Diss, MD  Electronically Signed     SAL/MEDQ  D:  04/17/2006  T:  04/17/2006  Job:  [id number]

## 2011-04-20 NOTE — H&P (Signed)
NAME:  [name], [name] NO.:  [id number]   MEDICAL RECORD NO.:  [id number]          PATIENT TYPE:  INP   LOCATION:  A218                          FACILITY:  [facility]   PHYSICIAN:  Scott A. Gerda Diss, MD    DATE OF BIRTH:  1951/06/13   DATE OF ADMISSION:  04/15/2006  DATE OF DISCHARGE:  LH                                HISTORY & PHYSICAL   CHIEF COMPLAINT:  Vomiting blood.   HISTORY OF PRESENT ILLNESS:  This is a 60 year old white male who states  over the past four days he has had several episodes where he has vomited and  had a maroon looking blood with it.  Stated that he just felt ill, threw up  and there was a large amount of blood the first time and then the subsequent  times he threw up there was not as much blood.  He states he has been able  to keep things down and states his bowel movements have seemed normal, not  black, tarry or bloody.  Denies any dysuria or hematuria.  Denies fever or  chills.  States his energy level is getting weaker.  Denies shortness of  breath or chest pressure or tightness.  Denies severe abdominal pain.  Patient states he has been taking some Aleve recently for knee pain that is  a chronic problem.  He also drinks some alcohol, although he does not drink  on a regular basis.   ALLERGIES:  NO KNOWN DRUG ALLERGIES.   MEDICATIONS:  1.  Xanax 0.5 mg one q.i.d.  2.  Pamelor 75 mg three nightly.  3.  Restoril 15 mg three nightly.  4.  Vicodin q.4h. p.r.n. discomfort.  5.  Zestril 5 mg half daily.   PAST MEDICAL HISTORY:  1.  History of depression.  2.  History of anxiety.  He takes multiple medications through the [facility] and Dr. Betti Cruz.  3.  History of hypertension.  4.  History of tobacco abuse.  5.  Chronic pain in the right leg where he had amputation from 1984.  6.  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   SOCIAL HISTORY:  He is divorced.  He is disabled.  Does not work.   FAMILY HISTORY:  Breast cancer, hypertension and  diabetes.   PHYSICAL EXAMINATION:  GENERAL APPEARANCE:  __________  He appears pale.  VITAL SIGNS:  Blood pressure 94/62.  Orthostatics were negative.  He has had  some blood pressures low like this in the past when he has not been sick.  NECK:  No masses.  LUNGS:  Clear.  CARDIOVASCULAR:  Regular.  ABDOMEN:  Soft.  RECTAL:  Normal color stool, but heme-positive.  EXTREMITIES:  No edema.   LABORATORY DATA:  White count 5.6, hemoglobin 9.4. INR 1.  Sodium 135,  potassium 3.5.  Liver enzymes normal.   ASSESSMENT/PLAN:  Hematemesis along with anemia - patient needs to be  admitted in the hospital and monitored.  Hemoglobin checked in the morning.  Patient also needs to have GI see him and they are  geared toward doing an  esophagogastroduodenoscopy.  We will place him on IV Protonix.  He is at  high risk for an ulcer.  We told him that he needs to lay off the drinking  and stay away from nonsteroidal anti-inflammatory drugs.      Scott A. Gerda Diss, MD  Electronically Signed     SAL/MEDQ  D:  04/16/2006  T:  04/16/2006  Job:  [id number]

## 2012-04-10 DIAGNOSIS — G8929 Other chronic pain: Secondary | ICD-10-CM | POA: Diagnosis not present

## 2012-04-10 DIAGNOSIS — K219 Gastro-esophageal reflux disease without esophagitis: Secondary | ICD-10-CM | POA: Diagnosis not present

## 2012-05-14 DIAGNOSIS — Z0000 Encounter for general adult medical examination without abnormal findings: Secondary | ICD-10-CM | POA: Diagnosis not present

## 2012-12-18 DIAGNOSIS — R52 Pain, unspecified: Secondary | ICD-10-CM | POA: Diagnosis not present

## 2013-03-05 ENCOUNTER — Other Ambulatory Visit: Payer: Self-pay | Admitting: *Deleted

## 2013-05-18 ENCOUNTER — Other Ambulatory Visit: Payer: Self-pay | Admitting: Family Medicine

## 2013-05-18 NOTE — Telephone Encounter (Signed)
RX called into pharmacy

## 2013-05-18 NOTE — Telephone Encounter (Signed)
May have one refill and needs OV this summer

## 2013-05-20 ENCOUNTER — Other Ambulatory Visit: Payer: Self-pay | Admitting: Family Medicine

## 2013-05-20 NOTE — Telephone Encounter (Signed)
1 refill may be granted. He needs to schedule followup office visit.

## 2013-06-12 ENCOUNTER — Other Ambulatory Visit: Payer: Self-pay | Admitting: Family Medicine

## 2013-06-12 NOTE — Telephone Encounter (Signed)
Ok times one needs OV within 30 days

## 2013-06-17 ENCOUNTER — Other Ambulatory Visit: Payer: Self-pay | Admitting: Family Medicine

## 2013-06-17 NOTE — Telephone Encounter (Signed)
Last office visit was 12/08/12 and 4 refills were given. He takes hydrocodone 7.5/325 #120 1 tab qid

## 2013-06-17 NOTE — Telephone Encounter (Signed)
Ok times one. Overdue for f u with dr Lorin Picket. nees appt

## 2013-06-17 NOTE — Telephone Encounter (Signed)
Same answer as other

## 2013-06-26 ENCOUNTER — Other Ambulatory Visit: Payer: Self-pay | Admitting: Family Medicine

## 2013-06-26 NOTE — Telephone Encounter (Signed)
Refill x1 patient needs to be seen

## 2013-06-26 NOTE — Telephone Encounter (Signed)
Last office visit 10/08/13 

## 2013-07-10 ENCOUNTER — Other Ambulatory Visit: Payer: Self-pay | Admitting: Family Medicine

## 2013-07-10 ENCOUNTER — Ambulatory Visit (INDEPENDENT_AMBULATORY_CARE_PROVIDER_SITE_OTHER): Payer: Medicare Other | Admitting: Family Medicine

## 2013-07-10 ENCOUNTER — Encounter: Payer: Self-pay | Admitting: Family Medicine

## 2013-07-10 VITALS — BP 124/90 | Ht 72.0 in | Wt 176.4 lb

## 2013-07-10 DIAGNOSIS — M549 Dorsalgia, unspecified: Secondary | ICD-10-CM

## 2013-07-10 DIAGNOSIS — Z0000 Encounter for general adult medical examination without abnormal findings: Secondary | ICD-10-CM

## 2013-07-10 DIAGNOSIS — G4700 Insomnia, unspecified: Secondary | ICD-10-CM | POA: Diagnosis not present

## 2013-07-10 DIAGNOSIS — I10 Essential (primary) hypertension: Secondary | ICD-10-CM

## 2013-07-10 DIAGNOSIS — F411 Generalized anxiety disorder: Secondary | ICD-10-CM | POA: Diagnosis not present

## 2013-07-10 DIAGNOSIS — G8929 Other chronic pain: Secondary | ICD-10-CM

## 2013-07-10 DIAGNOSIS — Z125 Encounter for screening for malignant neoplasm of prostate: Secondary | ICD-10-CM

## 2013-07-10 DIAGNOSIS — R7301 Impaired fasting glucose: Secondary | ICD-10-CM

## 2013-07-10 MED ORDER — ALPRAZOLAM 0.5 MG PO TABS: ORAL_TABLET | ORAL | Status: DC

## 2013-07-10 MED ORDER — HYDROCODONE-ACETAMINOPHEN 7.5-325 MG PO TABS: ORAL_TABLET | ORAL | Status: DC

## 2013-07-10 MED ORDER — TEMAZEPAM 30 MG PO CAPS: ORAL_CAPSULE | ORAL | Status: DC

## 2013-07-10 MED ORDER — OMEPRAZOLE 40 MG PO CPDR: 40.0000 mg | DELAYED_RELEASE_CAPSULE | Freq: Every day | ORAL | Status: DC

## 2013-07-10 MED ORDER — NORTRIPTYLINE HCL 50 MG PO CAPS: ORAL_CAPSULE | ORAL | Status: DC

## 2013-07-10 NOTE — Progress Notes (Signed)
  Subjective:    Patient ID: [name], male    DOB: 1951-03-19, 62 y.o.   MRN: [id number]  HPI Patient here for a check up.  No concerns. This patient has a history of reflux he states Prevacid is good job at controlling it he does try to watch his diet  He also has history of headaches he takes his Pamelor at nighttime he states he's had minimal headaches over the past few months and that his stress levels are very low Patient also has history of chronic insomnia he has tried numerous ways of dealing with that without success he uses medication on a regular basis that does seem to help he denies being depressed He does have anxiety some days he feels very anxious and takes 3 or 4 per day other days he only takes one or 2 he denies abusing the Xanax. He also has chronic pain and discomfort mainly where he had amputation he has phantom pain Pamelor helps to some degree but he does use her hydrocodone he states on average it's 2 per day he denies abusing it.  Patient does state he smokes he knows he needs to quit but he's not allow the smoke in his apartment that has helped.   Review of Systems Denies cough vomiting wheezing shortness of breath denies chest pressure tightness or pain    Objective:   Physical Exam His lungs are clear, respiratory rate normal, heart regular. Pulse in the arms normal. No edema in his good leg. Abdomen soft.       Assessment & Plan:  #1 anxiety issues Xanax do not exceed 4 per day prescription with refills given #2 chronic orthopedic pain-do not exceed 2 per day prescription for hydrocodone 7.5/325 was given. Number 6D. Refills given. #3 chronic insomnia Restoril at nighttime as necessary. #4 reflux Prilosec as directed.

## 2013-09-08 ENCOUNTER — Telehealth: Payer: Self-pay | Admitting: Family Medicine

## 2013-09-08 MED ORDER — HYDROCODONE-ACETAMINOPHEN 7.5-325 MG PO TABS: ORAL_TABLET | ORAL | Status: DC

## 2013-09-08 NOTE — Telephone Encounter (Signed)
We may do one prescription for 30 days, obviously no refill, all sign it then it can be mailed to the patient. The patient will need office visit before this prescription runs out in order to get further prescriptions.

## 2013-09-08 NOTE — Telephone Encounter (Signed)
Last seen 07/10/13

## 2013-09-08 NOTE — Telephone Encounter (Signed)
FYI, patient was very persistent in [place] calling this Rx in for him because he says there is no way he can pick it up. I explained to him that there was no way we could call this in due to the new law, but he was not understanding that at all.

## 2013-09-08 NOTE — Telephone Encounter (Signed)
Patient needs Rx for hydrocodone .Marland Kitchen He says he needs ASAP.

## 2013-09-08 NOTE — Telephone Encounter (Signed)
Script ready for pick up. Pt notified he needs appt.

## 2013-10-13 ENCOUNTER — Telehealth: Payer: Self-pay | Admitting: Family Medicine

## 2013-10-13 MED ORDER — HYDROCODONE-ACETAMINOPHEN 7.5-325 MG PO TABS: ORAL_TABLET | ORAL | Status: DC

## 2013-10-13 NOTE — Telephone Encounter (Signed)
Give 30 day, sched ov later in month, early dec latest

## 2013-10-13 NOTE — Telephone Encounter (Signed)
Called patient and I let him know that his prescription was ready to be picked up at the front. Pt verbalized understanding.

## 2013-10-13 NOTE — Telephone Encounter (Signed)
Last office visit 07-10-13

## 2013-10-13 NOTE — Telephone Encounter (Signed)
HYDROcodone-acetaminophen (NORCO) 7.5-325 MG per tablet  Pt needs refill please

## 2013-11-03 ENCOUNTER — Other Ambulatory Visit: Payer: Self-pay | Admitting: Family Medicine

## 2013-11-03 NOTE — Telephone Encounter (Signed)
Refill x2, needs office visit

## 2013-11-09 ENCOUNTER — Other Ambulatory Visit: Payer: Self-pay | Admitting: Family Medicine

## 2013-11-09 NOTE — Telephone Encounter (Signed)
Refill times 4 

## 2013-11-12 ENCOUNTER — Ambulatory Visit: Payer: Medicare Other | Admitting: Family Medicine

## 2013-11-12 ENCOUNTER — Telehealth: Payer: Self-pay | Admitting: Family Medicine

## 2013-11-12 NOTE — Telephone Encounter (Addendum)
Last seen 07/10/13 Last refill for hydrocodone 7.5/325 #120 on 10/13/13 ans was told needed office visit for further refills

## 2013-11-12 NOTE — Telephone Encounter (Signed)
Patient needs Rx for prostate issues for hydrocodone

## 2013-11-12 NOTE — Telephone Encounter (Signed)
The new reality is he has to come for an office visit. He may be scheduled one for next week. Medication is class II last visit greater than 3 months

## 2013-11-13 NOTE — Telephone Encounter (Signed)
Patient notified and transferred up front to make an appt.

## 2013-11-19 ENCOUNTER — Ambulatory Visit: Payer: Medicare Other | Admitting: Family Medicine

## 2013-11-19 ENCOUNTER — Telehealth: Payer: Self-pay | Admitting: Family Medicine

## 2013-11-19 NOTE — Telephone Encounter (Signed)
Show up tommorow to get meds

## 2013-11-19 NOTE — Telephone Encounter (Signed)
Patient needs Rx for hydrocodone. °

## 2013-11-20 ENCOUNTER — Ambulatory Visit (INDEPENDENT_AMBULATORY_CARE_PROVIDER_SITE_OTHER): Payer: Medicare Other | Admitting: Family Medicine

## 2013-11-20 ENCOUNTER — Encounter: Payer: Self-pay | Admitting: Family Medicine

## 2013-11-20 VITALS — BP 122/80 | Ht 72.0 in | Wt 178.2 lb

## 2013-11-20 DIAGNOSIS — M549 Dorsalgia, unspecified: Secondary | ICD-10-CM

## 2013-11-20 DIAGNOSIS — F411 Generalized anxiety disorder: Secondary | ICD-10-CM | POA: Diagnosis not present

## 2013-11-20 DIAGNOSIS — G8929 Other chronic pain: Secondary | ICD-10-CM | POA: Diagnosis not present

## 2013-11-20 MED ORDER — HYDROCODONE-ACETAMINOPHEN 7.5-325 MG PO TABS: ORAL_TABLET | ORAL | Status: DC

## 2013-11-20 MED ORDER — OMEPRAZOLE 40 MG PO CPDR: DELAYED_RELEASE_CAPSULE | ORAL | Status: DC

## 2013-11-20 MED ORDER — NORTRIPTYLINE HCL 50 MG PO CAPS: ORAL_CAPSULE | ORAL | Status: DC

## 2013-11-20 MED ORDER — ALPRAZOLAM 0.5 MG PO TABS: ORAL_TABLET | ORAL | Status: DC

## 2013-11-20 NOTE — Progress Notes (Signed)
   Subjective:    Patient ID: [name], male    DOB: 1951-01-26, 62 y.o.   MRN: [id number]  HPI Patient is here today to get a refill on his Norco.  Patient has chronic back pain and knee pain he has an amputation that also causes him pain he uses his pain medicine anywhere from 3-4 times a day he denies abusing it. Denies any other problems with it no side effects.  He also takes his Xanax for anxiety 3-4 times a day he denies any problems with the current No concerns.  PMH benign   Review of Systems    see above Objective:   Physical Exam  Lungs clear hearts regular blood pressure good extremities no edema skin warm dry      Assessment & Plan:  #1 chronic pain-prescription medication given. Followup if ongoing trouble. Followup in 3 months.  #2 anxiety issues uses Xanax responsibly refills updated

## 2014-01-21 ENCOUNTER — Telehealth: Payer: Self-pay | Admitting: Family Medicine

## 2014-01-21 NOTE — Telephone Encounter (Signed)
Rx prior auth obtained for pt's ALPRAZOLAM 0.5mg  tab #120/30days, expires 01/14/2015 through [facility]

## 2014-02-18 ENCOUNTER — Ambulatory Visit (INDEPENDENT_AMBULATORY_CARE_PROVIDER_SITE_OTHER): Payer: Medicare Other | Admitting: Family Medicine

## 2014-02-18 ENCOUNTER — Encounter: Payer: Self-pay | Admitting: Family Medicine

## 2014-02-18 VITALS — BP 134/90 | Ht 72.0 in | Wt 177.0 lb

## 2014-02-18 DIAGNOSIS — Z125 Encounter for screening for malignant neoplasm of prostate: Secondary | ICD-10-CM

## 2014-02-18 DIAGNOSIS — M549 Dorsalgia, unspecified: Secondary | ICD-10-CM

## 2014-02-18 DIAGNOSIS — G8929 Other chronic pain: Secondary | ICD-10-CM | POA: Diagnosis not present

## 2014-02-18 DIAGNOSIS — E785 Hyperlipidemia, unspecified: Secondary | ICD-10-CM

## 2014-02-18 MED ORDER — TEMAZEPAM 30 MG PO CAPS: ORAL_CAPSULE | ORAL | Status: DC

## 2014-02-18 MED ORDER — HYDROCODONE-ACETAMINOPHEN 7.5-325 MG PO TABS: ORAL_TABLET | ORAL | Status: DC

## 2014-02-18 MED ORDER — ALPRAZOLAM 0.5 MG PO TABS: ORAL_TABLET | ORAL | Status: DC

## 2014-02-18 NOTE — Progress Notes (Signed)
   Subjective:    Patient ID: [name], male    DOB: February 02, 1951, 63 y.o.   MRN: [id number]  HPI Patient is here today for a check up.  He just needs a refill on his meds. This patient was seen today for chronic pain  The medication list was reviewed and updated.  Discussion was held with the patient regarding compliance with pain medication. The patient was advised the importance of maintaining medication and not using illegal substances with these. The patient was educated that we can provide 3 monthly scripts for their medication, it is their responsibility to follow the instructions. Discussion was held with the patient to make sure they're not having significant side effects. Patient is aware that pain medications are meant to minimize the severity of the pain to allow their pain levels to improve to allow for better function. They are aware of that pain medications cannot totally remove their pain.    No concerns.    Review of Systems  Constitutional: Negative for fatigue.  HENT: Negative for congestion.   Respiratory: Negative for cough.   Cardiovascular: Negative for chest pain.  Gastrointestinal: Negative for abdominal pain.  Musculoskeletal: Positive for arthralgias and back pain.       Objective:   Physical Exam  Vitals reviewed. Constitutional: He appears well-nourished.  HENT:  Head: Normocephalic.  Cardiovascular: Normal rate, regular rhythm and normal heart sounds.   No murmur heard. Pulmonary/Chest: Effort normal and breath sounds normal.  Musculoskeletal: He exhibits no edema.  Lymphadenopathy:    He has no cervical adenopathy.  Neurological: He is alert.  Psychiatric: His behavior is normal.          Assessment & Plan:  Chronic back pain and leg pain he uses his pain medicine denies abusing it. Refills were given. Followup in 3 months.  Anxiety issues uses Xanax he denies abusing it does not make him feel loopy. He was cautioned regarding  drowsiness.  Patient will followup again in 3 months

## 2014-02-24 ENCOUNTER — Other Ambulatory Visit: Payer: Self-pay | Admitting: Family Medicine

## 2014-02-25 ENCOUNTER — Telehealth: Payer: Self-pay | Admitting: Family Medicine

## 2014-02-25 MED ORDER — NORTRIPTYLINE HCL 50 MG PO CAPS: ORAL_CAPSULE | ORAL | Status: DC

## 2014-02-25 NOTE — Telephone Encounter (Signed)
Rx sent electronically to pharmacy. Patient notified. 

## 2014-02-25 NOTE — Telephone Encounter (Signed)
Ok plus 5 ref 

## 2014-02-25 NOTE — Telephone Encounter (Signed)
Pt states he needs refill, please send this Rx to [place] and ask for them to deliver today.  Pt also requests it to have refills   nortriptyline (PAMELOR) 50 MG capsule

## 2014-05-21 ENCOUNTER — Telehealth: Payer: Self-pay | Admitting: Family Medicine

## 2014-05-21 MED ORDER — HYDROCODONE-ACETAMINOPHEN 7.5-325 MG PO TABS: ORAL_TABLET | ORAL | Status: DC

## 2014-05-21 NOTE — Telephone Encounter (Signed)
Patient needs Rx for hydrocodone.

## 2014-05-21 NOTE — Telephone Encounter (Signed)
Last seen 02/18/14

## 2014-05-21 NOTE — Telephone Encounter (Signed)
He may have two week prescription. Schedule office visit for this patient within that 2 weeks.

## 2014-05-24 NOTE — Telephone Encounter (Signed)
Left VM stating we have 2 weeks worth of med and that he needs an OV in order for more refills.

## 2014-06-01 ENCOUNTER — Ambulatory Visit: Payer: Medicare Other | Admitting: Family Medicine

## 2014-06-14 ENCOUNTER — Encounter: Payer: Self-pay | Admitting: Family Medicine

## 2014-06-14 ENCOUNTER — Ambulatory Visit (INDEPENDENT_AMBULATORY_CARE_PROVIDER_SITE_OTHER): Payer: Medicare Other | Admitting: Family Medicine

## 2014-06-14 VITALS — BP 134/78 | Ht 72.0 in | Wt 168.0 lb

## 2014-06-14 DIAGNOSIS — M25519 Pain in unspecified shoulder: Secondary | ICD-10-CM

## 2014-06-14 DIAGNOSIS — Z23 Encounter for immunization: Secondary | ICD-10-CM | POA: Diagnosis not present

## 2014-06-14 DIAGNOSIS — R490 Dysphonia: Secondary | ICD-10-CM

## 2014-06-14 DIAGNOSIS — M25511 Pain in right shoulder: Secondary | ICD-10-CM

## 2014-06-14 DIAGNOSIS — G8929 Other chronic pain: Secondary | ICD-10-CM

## 2014-06-14 DIAGNOSIS — M549 Dorsalgia, unspecified: Secondary | ICD-10-CM

## 2014-06-14 DIAGNOSIS — F411 Generalized anxiety disorder: Secondary | ICD-10-CM | POA: Diagnosis not present

## 2014-06-14 DIAGNOSIS — E785 Hyperlipidemia, unspecified: Secondary | ICD-10-CM

## 2014-06-14 DIAGNOSIS — Z125 Encounter for screening for malignant neoplasm of prostate: Secondary | ICD-10-CM

## 2014-06-14 DIAGNOSIS — Z13220 Encounter for screening for lipoid disorders: Secondary | ICD-10-CM

## 2014-06-14 MED ORDER — ALPRAZOLAM 0.5 MG PO TABS: ORAL_TABLET | ORAL | Status: DC

## 2014-06-14 MED ORDER — AMOXICILLIN 500 MG PO TABS: 500.0000 mg | ORAL_TABLET | Freq: Three times a day (TID) | ORAL | Status: DC

## 2014-06-14 MED ORDER — OMEPRAZOLE 40 MG PO CPDR: DELAYED_RELEASE_CAPSULE | ORAL | Status: DC

## 2014-06-14 MED ORDER — TEMAZEPAM 30 MG PO CAPS: ORAL_CAPSULE | ORAL | Status: DC

## 2014-06-14 MED ORDER — HYDROCODONE-ACETAMINOPHEN 7.5-325 MG PO TABS: ORAL_TABLET | ORAL | Status: DC

## 2014-06-14 NOTE — Progress Notes (Signed)
   Subjective:    Patient ID: [name], male    DOB: 1951-10-10, 63 y.o.   MRN: [id number]  HPI Patient is here today for a med check. He relates compliance with taking his medication. He does try to watch his diet. He does state he does not sleep well even with the medications He relates his reflux under decent control with taking the medication on regular basis he does try to watch his diet He relates anxiety medicine does help him he denies abusing it He relates pain medication does help him function better he denies abusing it  He needs a refill on his meds.  Pt also c/o of migraines. They come and go.  Recently he's developed hoarseness no cough no wheezing or shortness of breath.  25 minutes spent with patient discussing all his multiple issues and covering them today Review of Systems See above.    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 He will followup toward the end of these.  Anxiety issues Xanax on a when necessary basis patient denies abusing it states it helps him through the day he does see mental health for his other issues.  Patient is do blood work to look at his cholesterol has a history of hyperlipidemia. Also needs a PSA to screen for prostate cancer plus a metabolic 7 and a CBC.

## 2014-06-14 NOTE — Patient Instructions (Signed)
If hoarseness not gone in 2 weeks then need to come back for a office visit

## 2014-07-14 ENCOUNTER — Other Ambulatory Visit: Payer: Self-pay | Admitting: Family Medicine

## 2014-07-16 ENCOUNTER — Other Ambulatory Visit: Payer: Self-pay | Admitting: Family Medicine

## 2014-08-21 ENCOUNTER — Other Ambulatory Visit: Payer: Self-pay | Admitting: Family Medicine

## 2014-09-14 ENCOUNTER — Ambulatory Visit (INDEPENDENT_AMBULATORY_CARE_PROVIDER_SITE_OTHER): Payer: Medicare Other | Admitting: Family Medicine

## 2014-09-14 ENCOUNTER — Encounter: Payer: Self-pay | Admitting: Family Medicine

## 2014-09-14 VITALS — BP 110/70 | Ht 72.0 in | Wt 168.1 lb

## 2014-09-14 DIAGNOSIS — G4700 Insomnia, unspecified: Secondary | ICD-10-CM

## 2014-09-14 DIAGNOSIS — F411 Generalized anxiety disorder: Secondary | ICD-10-CM | POA: Diagnosis not present

## 2014-09-14 DIAGNOSIS — G8929 Other chronic pain: Secondary | ICD-10-CM | POA: Diagnosis not present

## 2014-09-14 DIAGNOSIS — M549 Dorsalgia, unspecified: Secondary | ICD-10-CM

## 2014-09-14 DIAGNOSIS — Z23 Encounter for immunization: Secondary | ICD-10-CM

## 2014-09-14 MED ORDER — HYDROCODONE-ACETAMINOPHEN 7.5-325 MG PO TABS: ORAL_TABLET | ORAL | Status: DC

## 2014-09-14 MED ORDER — TEMAZEPAM 30 MG PO CAPS: ORAL_CAPSULE | ORAL | Status: DC

## 2014-09-14 MED ORDER — ALPRAZOLAM 0.5 MG PO TABS: ORAL_TABLET | ORAL | Status: DC

## 2014-09-14 MED ORDER — HYDROCODONE-ACETAMINOPHEN 7.5-325 MG PO TABS
ORAL_TABLET | ORAL | Status: DC
Start: 2014-09-14 — End: 2014-09-14

## 2014-09-14 MED ORDER — OMEPRAZOLE 40 MG PO CPDR: DELAYED_RELEASE_CAPSULE | ORAL | Status: DC

## 2014-09-14 NOTE — Patient Instructions (Signed)

## 2014-09-14 NOTE — Progress Notes (Signed)
   Subjective:    Patient ID: [name], male    DOB: 12-08-1950, 63 y.o.   MRN: [id number]  HPI This patient was seen today for chronic pain  The medication list was reviewed and updated.   -Compliance with pain medication: yes  The patient was advised the importance of maintaining medication and not using illegal substances with these.  Refills needed: yes  The patient was educated that we can provide 3 monthly scripts for their medication, it is their responsibility to follow the instructions.  Side effects or complications from medications: none  Patient is aware that pain medications are meant to minimize the severity of the pain to allow their pain levels to improve to allow for better function. They are aware of that pain medications cannot totally remove their pain.  Due for UDT ( at least once per year) :  Patient states that he has no other concerns at this time.    He relates his moods are doing good denies being depressed anxiety is controlled with Xanax 3-4 times a day he denies abusing it He is driving he is healthy stay active as best as he can his prosthesis is working out fine he does smoke he knows he only quit    Review of Systems  Constitutional: Negative for activity change, appetite change and fatigue.  Gastrointestinal: Negative for abdominal pain.  Neurological: Negative for headaches.  Psychiatric/Behavioral: Negative for behavioral problems.       Objective:   Physical Exam  Vitals reviewed. Constitutional: He appears well-nourished. No distress.  Cardiovascular: Normal rate, regular rhythm and normal heart sounds.   No murmur heard. Pulmonary/Chest: Effort normal and breath sounds normal. No respiratory distress.  Abdominal: Soft. There is no tenderness.  Musculoskeletal: He exhibits no edema.  Lymphadenopathy:    He has no cervical adenopathy.  Neurological: He is alert.  Psychiatric: His behavior is normal.          Assessment &  Plan:  Chronic back pain pain medication being used by the patient without trouble. 3 prescriptions given.  Anxiety issues stable denies abusing meds and uses it 2-4 times per day  Insomnia and uses temazepam at nighttime  Lab work highly recommended  Followup 3 months

## 2014-10-12 ENCOUNTER — Encounter: Payer: Self-pay | Admitting: *Deleted

## 2014-10-12 DIAGNOSIS — Z125 Encounter for screening for malignant neoplasm of prostate: Secondary | ICD-10-CM | POA: Diagnosis not present

## 2014-10-12 DIAGNOSIS — R490 Dysphonia: Secondary | ICD-10-CM | POA: Diagnosis not present

## 2014-10-12 DIAGNOSIS — Z13220 Encounter for screening for lipoid disorders: Secondary | ICD-10-CM | POA: Diagnosis not present

## 2014-10-12 DIAGNOSIS — E785 Hyperlipidemia, unspecified: Secondary | ICD-10-CM | POA: Diagnosis not present

## 2014-10-13 ENCOUNTER — Encounter: Payer: Self-pay | Admitting: Family Medicine

## 2014-10-13 LAB — CBC WITH DIFFERENTIAL/PLATELET
BASOS PCT: 0 % (ref 0–1)
Basophils Absolute: 0 10*3/uL (ref 0.0–0.1)
EOS ABS: 0.2 10*3/uL (ref 0.0–0.7)
Eosinophils Relative: 2 % (ref 0–5)
HCT: 41.9 % (ref 39.0–52.0)
Hemoglobin: 14.5 g/dL (ref 13.0–17.0)
LYMPHS ABS: 3.8 10*3/uL (ref 0.7–4.0)
Lymphocytes Relative: 36 % (ref 12–46)
MCH: 32.9 pg (ref 26.0–34.0)
MCHC: 34.6 g/dL (ref 30.0–36.0)
MCV: 95 fL (ref 78.0–100.0)
Monocytes Absolute: 0.6 10*3/uL (ref 0.1–1.0)
Monocytes Relative: 6 % (ref 3–12)
Neutro Abs: 5.9 10*3/uL (ref 1.7–7.7)
Neutrophils Relative %: 56 % (ref 43–77)
PLATELETS: 281 10*3/uL (ref 150–400)
RBC: 4.41 MIL/uL (ref 4.22–5.81)
RDW: 13.9 % (ref 11.5–15.5)
WBC: 10.5 10*3/uL (ref 4.0–10.5)

## 2014-10-13 LAB — LIPID PANEL
CHOLESTEROL: 152 mg/dL (ref 0–200)
HDL: 42 mg/dL (ref >39)
LDL Cholesterol: 85 mg/dL (ref 0–99)
Total CHOL/HDL Ratio: 3.6 Ratio
Triglycerides: 124 mg/dL (ref <150)
VLDL: 25 mg/dL (ref 0–40)

## 2014-10-13 LAB — BASIC METABOLIC PANEL
BUN: 8 mg/dL (ref 6–23)
CALCIUM: 8.9 mg/dL (ref 8.4–10.5)
CHLORIDE: 95 meq/L — AB (ref 96–112)
CO2: 34 meq/L — AB (ref 19–32)
CREATININE: 0.86 mg/dL (ref 0.50–1.35)
GLUCOSE: 81 mg/dL (ref 70–99)
Potassium: 4.1 mEq/L (ref 3.5–5.3)
Sodium: 134 mEq/L — ABNORMAL LOW (ref 135–145)

## 2014-10-13 LAB — PSA: PSA: 0.46 ng/mL (ref <=4.00)

## 2014-12-08 ENCOUNTER — Ambulatory Visit (INDEPENDENT_AMBULATORY_CARE_PROVIDER_SITE_OTHER): Payer: Medicare Other | Admitting: Family Medicine

## 2014-12-08 ENCOUNTER — Encounter: Payer: Self-pay | Admitting: Family Medicine

## 2014-12-08 VITALS — BP 114/70 | Ht 72.0 in | Wt 172.4 lb

## 2014-12-08 DIAGNOSIS — G8929 Other chronic pain: Secondary | ICD-10-CM | POA: Diagnosis not present

## 2014-12-08 DIAGNOSIS — F411 Generalized anxiety disorder: Secondary | ICD-10-CM | POA: Diagnosis not present

## 2014-12-08 DIAGNOSIS — G4700 Insomnia, unspecified: Secondary | ICD-10-CM | POA: Diagnosis not present

## 2014-12-08 DIAGNOSIS — M549 Dorsalgia, unspecified: Secondary | ICD-10-CM

## 2014-12-08 DIAGNOSIS — E785 Hyperlipidemia, unspecified: Secondary | ICD-10-CM

## 2014-12-08 MED ORDER — HYDROCODONE-ACETAMINOPHEN 7.5-325 MG PO TABS: ORAL_TABLET | ORAL | Status: DC

## 2014-12-08 MED ORDER — ALPRAZOLAM 0.5 MG PO TABS: ORAL_TABLET | ORAL | Status: DC

## 2014-12-08 NOTE — Patient Instructions (Signed)

## 2014-12-08 NOTE — Progress Notes (Signed)
   Subjective:    Patient ID: [name], male    DOB: Dec 09, 1950, 64 y.o.   MRN: [id number]  HPI This patient was seen today for chronic pain  The medication list was reviewed and updated.   -Compliance with pain medication: yes  The patient was advised the importance of maintaining medication and not using illegal substances with these.  Refills needed: yes  The patient was educated that we can provide 3 monthly scripts for their medication, it is their responsibility to follow the instructions.  Side effects or complications from medications: none  Patient is aware that pain medications are meant to minimize the severity of the pain to allow their pain levels to improve to allow for better function. They are aware of that pain medications cannot totally remove their pain.  Due for UDT ( at least once per year) : next visit  Patient states he has no concerns at this time.   Patient was counseled to not smoke Patient's insomnia under decent control he's been on medication for years without difficulty.  Patient states he does not use Xanax as often as prescribed sometimes only a couple times a day sometimes 4 times a day depends on what's going on patient denies being depressed currently  Patient relates hyperlipidemiathis is diet controlled. Will need lab testing in the spring.  Review of Systems Patient denies any chest tightness pressure pain shortness of breath.    Objective:   Physical Exam Subjective discomfort in his lower back subjective discomfort in his knee where the prosthetic leg is his lungs are clear hearts regular abdomen is soft       Assessment & Plan:  Chronic anxiety issues use Xanax when necessary  Chronic back pain and knee pain prescriptions for pain medicines given.

## 2015-03-04 ENCOUNTER — Other Ambulatory Visit: Payer: Self-pay | Admitting: Family Medicine

## 2015-03-04 NOTE — Telephone Encounter (Signed)
[place] with five monthly ref

## 2015-03-09 ENCOUNTER — Ambulatory Visit: Payer: Medicare Other | Admitting: Family Medicine

## 2015-03-10 ENCOUNTER — Ambulatory Visit (INDEPENDENT_AMBULATORY_CARE_PROVIDER_SITE_OTHER): Payer: Medicare Other | Admitting: Family Medicine

## 2015-03-10 ENCOUNTER — Encounter: Payer: Self-pay | Admitting: Family Medicine

## 2015-03-10 VITALS — BP 130/80 | Ht 72.0 in | Wt 164.0 lb

## 2015-03-10 DIAGNOSIS — G8929 Other chronic pain: Secondary | ICD-10-CM | POA: Diagnosis not present

## 2015-03-10 DIAGNOSIS — M549 Dorsalgia, unspecified: Secondary | ICD-10-CM

## 2015-03-10 DIAGNOSIS — F411 Generalized anxiety disorder: Secondary | ICD-10-CM | POA: Diagnosis not present

## 2015-03-10 MED ORDER — ALPRAZOLAM 1 MG PO TABS: ORAL_TABLET | ORAL | Status: DC

## 2015-03-10 MED ORDER — HYDROCODONE-ACETAMINOPHEN 7.5-325 MG PO TABS: ORAL_TABLET | ORAL | Status: DC

## 2015-03-10 NOTE — Patient Instructions (Signed)
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
## 2015-03-15 ENCOUNTER — Other Ambulatory Visit: Payer: Self-pay | Admitting: *Deleted

## 2015-03-15 MED ORDER — ALPRAZOLAM 1 MG PO TABS: ORAL_TABLET | ORAL | Status: DC

## 2015-03-24 ENCOUNTER — Other Ambulatory Visit: Payer: Self-pay | Admitting: Family Medicine

## 2015-04-01 ENCOUNTER — Other Ambulatory Visit: Payer: Self-pay | Admitting: Family Medicine

## 2015-04-29 ENCOUNTER — Encounter (HOSPITAL_COMMUNITY): Payer: Self-pay | Admitting: *Deleted

## 2015-04-29 ENCOUNTER — Emergency Department (HOSPITAL_COMMUNITY): Payer: Medicare Other

## 2015-04-29 ENCOUNTER — Observation Stay (HOSPITAL_COMMUNITY)
Admission: EM | Admit: 2015-04-29 | Discharge: 2015-04-30 | Disposition: A | Discharge status: Home / Self Care | Payer: Medicare Other | Attending: Internal Medicine | Admitting: Internal Medicine

## 2015-04-29 DIAGNOSIS — Z720 Tobacco use: Secondary | ICD-10-CM | POA: Insufficient documentation

## 2015-04-29 DIAGNOSIS — R55 Syncope and collapse: Secondary | ICD-10-CM | POA: Diagnosis present

## 2015-04-29 DIAGNOSIS — R569 Unspecified convulsions: Principal | ICD-10-CM | POA: Insufficient documentation

## 2015-04-29 DIAGNOSIS — R4182 Altered mental status, unspecified: Secondary | ICD-10-CM | POA: Diagnosis present

## 2015-04-29 DIAGNOSIS — I471 Supraventricular tachycardia, unspecified: Secondary | ICD-10-CM | POA: Diagnosis present

## 2015-04-29 DIAGNOSIS — R401 Stupor: Secondary | ICD-10-CM | POA: Diagnosis not present

## 2015-04-29 DIAGNOSIS — F419 Anxiety disorder, unspecified: Secondary | ICD-10-CM | POA: Diagnosis not present

## 2015-04-29 DIAGNOSIS — F411 Generalized anxiety disorder: Secondary | ICD-10-CM | POA: Diagnosis present

## 2015-04-29 DIAGNOSIS — R000 Tachycardia, unspecified: Secondary | ICD-10-CM | POA: Diagnosis not present

## 2015-04-29 DIAGNOSIS — R400 Somnolence: Secondary | ICD-10-CM | POA: Diagnosis not present

## 2015-04-29 DIAGNOSIS — G8929 Other chronic pain: Secondary | ICD-10-CM | POA: Diagnosis present

## 2015-04-29 DIAGNOSIS — Z79899 Other long term (current) drug therapy: Secondary | ICD-10-CM | POA: Insufficient documentation

## 2015-04-29 DIAGNOSIS — N179 Acute kidney failure, unspecified: Secondary | ICD-10-CM | POA: Diagnosis present

## 2015-04-29 DIAGNOSIS — E785 Hyperlipidemia, unspecified: Secondary | ICD-10-CM | POA: Insufficient documentation

## 2015-04-29 DIAGNOSIS — M549 Dorsalgia, unspecified: Secondary | ICD-10-CM

## 2015-04-29 DIAGNOSIS — F1290 Cannabis use, unspecified, uncomplicated: Secondary | ICD-10-CM | POA: Diagnosis present

## 2015-04-29 HISTORY — DX: Low back pain, unspecified: M54.50

## 2015-04-29 HISTORY — DX: Acquired absence of right leg below knee: Z89.511

## 2015-04-29 HISTORY — DX: Anxiety disorder, unspecified: F41.9

## 2015-04-29 HISTORY — DX: Other chronic pain: G89.29

## 2015-04-29 HISTORY — DX: Low back pain: M54.5

## 2015-04-29 HISTORY — DX: Psychophysiologic insomnia: F51.04

## 2015-04-29 LAB — CBC WITH DIFFERENTIAL/PLATELET
BASOS ABS: 0 10*3/uL (ref 0.0–0.1)
BASOS PCT: 0 % (ref 0–1)
Eosinophils Absolute: 0.1 10*3/uL (ref 0.0–0.7)
Eosinophils Relative: 1 % (ref 0–5)
HCT: 40.6 % (ref 39.0–52.0)
Hemoglobin: 14 g/dL (ref 13.0–17.0)
Lymphocytes Relative: 13 % (ref 12–46)
Lymphs Abs: 1.3 10*3/uL (ref 0.7–4.0)
MCH: 32.1 pg (ref 26.0–34.0)
MCHC: 34.5 g/dL (ref 30.0–36.0)
MCV: 93.1 fL (ref 78.0–100.0)
MONO ABS: 0.6 10*3/uL (ref 0.1–1.0)
Monocytes Relative: 6 % (ref 3–12)
Neutro Abs: 8.2 10*3/uL — ABNORMAL HIGH (ref 1.7–7.7)
Neutrophils Relative %: 80 % — ABNORMAL HIGH (ref 43–77)
Platelets: 295 10*3/uL (ref 150–400)
RBC: 4.36 MIL/uL (ref 4.22–5.81)
RDW: 12.7 % (ref 11.5–15.5)
WBC: 10.2 10*3/uL (ref 4.0–10.5)

## 2015-04-29 LAB — RAPID URINE DRUG SCREEN, HOSP PERFORMED
AMPHETAMINES: NOT DETECTED
BENZODIAZEPINES: POSITIVE — AB
Barbiturates: NOT DETECTED
COCAINE: NOT DETECTED
Opiates: NOT DETECTED
Tetrahydrocannabinol: POSITIVE — AB

## 2015-04-29 LAB — COMPREHENSIVE METABOLIC PANEL
ALBUMIN: 4.7 g/dL (ref 3.5–5.0)
ALK PHOS: 94 U/L (ref 38–126)
ALT: 14 U/L — ABNORMAL LOW (ref 17–63)
AST: 30 U/L (ref 15–41)
Anion gap: 31 — ABNORMAL HIGH (ref 5–15)
BUN: 13 mg/dL (ref 6–20)
CHLORIDE: 91 mmol/L — AB (ref 101–111)
CO2: 15 mmol/L — ABNORMAL LOW (ref 22–32)
Calcium: 9.8 mg/dL (ref 8.9–10.3)
Creatinine, Ser: 1.59 mg/dL — ABNORMAL HIGH (ref 0.61–1.24)
GFR calc Af Amer: 52 mL/min — ABNORMAL LOW (ref >60)
GFR calc non Af Amer: 45 mL/min — ABNORMAL LOW (ref >60)
Glucose, Bld: 163 mg/dL — ABNORMAL HIGH (ref 65–99)
Potassium: 3.7 mmol/L (ref 3.5–5.1)
SODIUM: 137 mmol/L (ref 135–145)
Total Bilirubin: 0.8 mg/dL (ref 0.3–1.2)
Total Protein: 9.8 g/dL — ABNORMAL HIGH (ref 6.5–8.1)

## 2015-04-29 LAB — CBG MONITORING, ED: Glucose-Capillary: 209 mg/dL — ABNORMAL HIGH (ref 65–99)

## 2015-04-29 LAB — URINALYSIS, ROUTINE W REFLEX MICROSCOPIC
BILIRUBIN URINE: NEGATIVE
Glucose, UA: NEGATIVE mg/dL
Hgb urine dipstick: NEGATIVE
Leukocytes, UA: NEGATIVE
Nitrite: NEGATIVE
Protein, ur: NEGATIVE mg/dL
Specific Gravity, Urine: 1.01 (ref 1.005–1.030)
Urobilinogen, UA: 0.2 mg/dL (ref 0.0–1.0)
pH: 6 (ref 5.0–8.0)

## 2015-04-29 LAB — BASIC METABOLIC PANEL
Anion gap: 10 (ref 5–15)
BUN: 12 mg/dL (ref 6–20)
CO2: 26 mmol/L (ref 22–32)
CREATININE: 1.27 mg/dL — AB (ref 0.61–1.24)
Calcium: 8 mg/dL — ABNORMAL LOW (ref 8.9–10.3)
Chloride: 99 mmol/L — ABNORMAL LOW (ref 101–111)
GFR calc Af Amer: >60 mL/min (ref >60)
GFR, EST NON AFRICAN AMERICAN: 59 mL/min — AB (ref >60)
Glucose, Bld: 117 mg/dL — ABNORMAL HIGH (ref 65–99)
Potassium: 3.8 mmol/L (ref 3.5–5.1)
Sodium: 135 mmol/L (ref 135–145)

## 2015-04-29 LAB — CK: Total CK: 60 U/L (ref 49–397)

## 2015-04-29 LAB — TROPONIN I: Troponin I: <0.03 ng/mL (ref <0.031)

## 2015-04-29 LAB — I-STAT CG4 LACTIC ACID, ED: LACTIC ACID, VENOUS: 1.34 mmol/L (ref 0.5–2.0)

## 2015-04-29 MED ORDER — SODIUM CHLORIDE 0.9 % IV BOLUS (SEPSIS)
1000.0000 mL | Freq: Once | INTRAVENOUS | Status: AC
  Administered 2015-04-29: 1000 mL via INTRAVENOUS

## 2015-04-29 MED ORDER — ONDANSETRON HCL 4 MG/2ML IJ SOLN
4.0000 mg | Freq: Once | INTRAMUSCULAR | Status: AC
  Administered 2015-04-29: 4 mg via INTRAVENOUS
  Filled 2015-04-29: qty 2

## 2015-04-29 NOTE — Discharge Instructions (Signed)
Seizure, Adult A seizure is abnormal electrical activity in the brain. Seizures usually last from 30 seconds to 2 minutes. There are various types of seizures. Before a seizure, you may have a warning sensation (aura) that a seizure is about to occur. An aura may include the following symptoms:   Fear or anxiety.  Nausea.  Feeling like the room is spinning (vertigo).  Vision changes, such as seeing flashing lights or spots. Common symptoms during a seizure include:  A change in attention or behavior (altered mental status).  Convulsions with rhythmic jerking movements.  Drooling.  Rapid eye movements.  Grunting.  Loss of bladder and bowel control.  Bitter taste in the mouth.  Tongue biting. After a seizure, you may feel confused and sleepy. You may also have an injury resulting from convulsions during the seizure. HOME CARE INSTRUCTIONS   If you are given medicines, take them exactly as prescribed by your health care provider.  Keep all follow-up appointments as directed by your health care provider.  Do not swim or drive or engage in risky activity during which a seizure could cause further injury to you or others until your health care provider says it is OK.  Get adequate rest.  Teach friends and family what to do if you have a seizure. They should:  Lay you on the ground to prevent a fall.  Put a cushion under your head.  Loosen any tight clothing around your neck.  Turn you on your side. If vomiting occurs, this helps keep your airway clear.  Stay with you until you recover.  Know whether or not you need emergency care. SEEK IMMEDIATE MEDICAL CARE IF:  The seizure lasts longer than 5 minutes.  The seizure is severe or you do not wake up immediately after the seizure.  You have an altered mental status after the seizure.  You are having more frequent or worsening seizures. Someone should drive you to the emergency department or call local emergency  services (911 in [place] MAKE SURE YOU:  Understand these instructions.  Will watch your condition.  Will get help right away if you are not doing well or get worse. Document Released: 11/16/2000 Document Revised: 09/09/2013 Document Reviewed: 07/01/2013 [facility] Patient Information 2015 [place], [place]. This information is not intended to replace advice given to you by your health care provider. Make sure you discuss any questions you have with your health care provider.  Driving and Equipment Restrictions Some medical problems make it dangerous to drive, ride a bike, or use machines. Some of these problems are:  A hard blow to the head (concussion).  Passing out (fainting).  Twitching and shaking (seizures).  Low blood sugar.  Taking medicine to help you relax (sedatives).  Taking pain medicines.  Wearing an eye patch.  Wearing splints. This can make it hard to use parts of your body that you need to drive safely. HOME CARE   Do not drive until your doctor says it is okay.  Do not use machines until your doctor says it is okay. You may need a form signed by your doctor (medical release) before you can drive again. You may also need this form before you do other tasks where you need to be fully alert. MAKE SURE YOU:  Understand these instructions.  Will watch your condition.  Will get help right away if you are not doing well or get worse. Document Released: 12/27/2004 Document Revised: 02/11/2012 Document Reviewed: 03/29/2010 [facility] Patient Information 2015 [place], [place].  This information is not intended to replace advice given to you by your health care provider. Make sure you discuss any questions you have with your health care provider. ° °

## 2015-04-29 NOTE — ED Provider Notes (Signed)
TIME SEEN: 6:30 PM  CHIEF COMPLAINT: Seizure  HPI: Pt is a 64 y.o. male with history of hyperlipidemia, anxiety on Xanax he presents to the emergency department after an episode that sounds like a seizure. Family reports he was in the car when he getting shaking all over for several minutes. He is now slow to answer questions and is confused. Family denies any recent head injury. No fevers, cough, vomiting or diarrhea. Patient's nephew has a history of seizures. No bowel or bladder incontinence. No tongue biting.  He has never had a seizure before.  ROS: See HPI Constitutional: no fever  Eyes: no drainage  ENT: no runny nose   Cardiovascular:  no chest pain  Resp: no SOB  GI: no vomiting or diarrhea GU: no dysuria Integumentary: no rash  Allergy: no hives  Musculoskeletal: no leg swelling  Neurological: no slurred speech ROS otherwise negative  PAST MEDICAL HISTORY/PAST SURGICAL HISTORY:  Past Medical History  Diagnosis Date  . Hyperlipidemia     MEDICATIONS:  Prior to Admission medications   Medication Sig Start Date End Date Taking? Authorizing Provider  ALPRAZolam Duanne Moron) 1 MG tablet TAKE ONE - HALF TABLET  EVERY 6 HOURS PRN 03/15/15   [name], MD  HYDROcodone-acetaminophen (NORCO) 7.5-325 MG per tablet TAKE ONE TABLET BY MOUTH FOUR TIMES DAILY AS NEEDED. 03/10/15   [name], MD  nortriptyline (PAMELOR) 50 MG capsule TAKE 3 CAPSULES BY MOUTH AT BEDTIME. 03/24/15   [name], MD  omeprazole (PRILOSEC) 40 MG capsule TAKE (1) CAPSULE BY MOUTH ONCE A DAY FOR ACID REFLUX. 04/01/15   Mikey Kirschner, MD  temazepam (RESTORIL) 30 MG capsule TAKE (1) CAPSULE BY MOUTH AT BEDTIME AS NEEDED. 03/04/15   Mikey Kirschner, MD    ALLERGIES:  Allergies  Allergen Reactions  . Benadryl [Diphenhydramine Hcl]     SOCIAL HISTORY:  History  Substance Use Topics  . Smoking status: Light Tobacco Smoker    Types: Cigarettes  . Smokeless tobacco: Not on file  . Alcohol Use: Not on  file    FAMILY HISTORY: History reviewed. No pertinent family history.  EXAM: BP 100/77 mmHg  Pulse 146  Temp(Src) 97.8 F (36.6 C) (Rectal)  Resp 30  SpO2 89% CONSTITUTIONAL: Alert and oriented to person and place but not time and responds appropriately to questions. Well-appearing; well-nourished HEAD: Normocephalic EYES: Conjunctivae clear, PERRL ENT: normal nose; no rhinorrhea; moist mucous membranes; pharynx without lesions noted NECK: Supple, no meningismus, no LAD  CARD: Regular and tachycardic; S1 and S2 appreciated; no murmurs, no clicks, no rubs, no gallops RESP: Normal chest excursion without splinting or tachypnea; breath sounds clear and equal bilaterally; no wheezes, no rhonchi, no rales, no hypoxia or respiratory distress, speaking full sentences ABD/GI: Normal bowel sounds; non-distended; soft, non-tender, no rebound, no guarding, no peritoneal signs BACK:  The back appears normal and is non-tender to palpation, there is no CVA tenderness EXT: Normal ROM in all joints; non-tender to palpation; no edema; normal capillary refill; no cyanosis, no calf tenderness or swelling    SKIN: Normal color for age and race; warm NEURO: Moves all extremities equally, sensation to light touch intact diffusely, cranial nerves II through XII intact PSYCH: The patient's mood and manner are appropriate. Grooming and personal hygiene are appropriate.  MEDICAL DECISION MAKING: Patient here after what sounds like a seizure. Will obtain labs, urine, head CT. EKG shows sinus tachycardia. He denies chest pain or shortness of breath. Will  closely monitor, give IV fluids.  ED PROGRESS: Labs show elevated anion gap metabolic acidosis which may be from elevated lactate due to seizure. We'll give second liter of IV fluids and repeat labs.   Repeat labs have significant improved. Her drug screen is positive for benzodiazepine which he is prescribed and states he has not missed any recent doses of  this medication although does take it when necessary. Doubt that this was a withdrawal seizure given he does have benzodiazepine's in his system. THC is positive as well. Urine shows no sign of infection. Head CT negative. 3 has improved with the low 100s after IV hydration and he looks much better and is now oriented 3, neurologically intact. Have advised him to avoid driving or any other activity that may be dangerous if performed by himself when he has another seizure. At this time I do not feel he needs to be on antiepileptics but I have given him outpatient neurology follow-up information. Discussed strict return precautions with patient and his family at bedside. They verbalize understanding and are comfortable with this plan.    EKG Interpretation  Date/Time:  Friday Apr 29 2015 18:26:57 EDT Ventricular Rate:  145 PR Interval:  107 QRS Duration: 97 QT Interval:  300 QTC Calculation: 466 R Axis:   -150 Text Interpretation:  Sinus or ectopic atrial tachycardia Markedly posterior QRS axis Probable right ventricular hypertrophy ST elevation, consider inferior injury No old tracing to compare Confirmed by Addasyn Mcbreen,  DO, Tenise Stetler [phone]) on 04/29/2015 6:48:45 PM          [place], DO 04/29/15 [id number]

## 2015-04-29 NOTE — ED Notes (Signed)
Dr Lacinda Axon at bedside with pt and family, family report that they do not feel comfortable taking pt home at present time,

## 2015-04-29 NOTE — ED Provider Notes (Signed)
I was asked to reevaluate the patient.  Family reports that he is not acting normally. Specifically, he has been more confused and disoriented. Will admit to general medicine.  Nat Christen, MD 04/29/15 [phone]

## 2015-04-29 NOTE — H&P (Signed)
[facility] History and Physical  [name] IEP:[id number] DOB: Feb 01, 1951    PCP:   Sallee Lange, MD   Chief Complaint: altered mental status suspicious for new onset of seizure.   HPI: [name] is an 64 y.o. male with hx of right BKA, severe anxiety disorder on low dose Xanax, rare alcohol drinker, hx of migraine, brought to the ER as he was seen shaking, followed by confusion and sleepiness.  He did not have any aura, tongue biting, bowel or bladder incontinence.  This is his new symptoms as he had no prior similar episode.  He has not been on any new medication, and denied any medication that can cause seizure.  He denied any cocaine use.  Evalaution in the ER included unremarkable serology with normal Na, normal BS, and negative head CT.  He was going to be discharged home with neurology follow up by EDP, but his family did not feel comfortable taking him home, and they felt that he is still not back to his baseline.   His UDS showed THC, and Benzo. On initial presentation, his HR showed SVT at 145.  It has now 100.    Rewiew of Systems:  Constitutional: Negative for malaise, fever and chills. No significant weight loss or weight gain Eyes: Negative for eye pain, redness and discharge, diplopia, visual changes, or flashes of light. ENMT: Negative for ear pain, hoarseness, nasal congestion, sinus pressure and sore throat. No headaches; tinnitus, drooling, or problem swallowing. Cardiovascular: Negative for chest pain, palpitations, diaphoresis, dyspnea and peripheral edema. ; No orthopnea, PND Respiratory: Negative for cough, hemoptysis, wheezing and stridor. No pleuritic chestpain. Gastrointestinal: Negative for nausea, vomiting, diarrhea, constipation, abdominal pain, melena, blood in stool, hematemesis, jaundice and rectal bleeding.    Genitourinary: Negative for frequency, dysuria, incontinence,flank pain and hematuria; Musculoskeletal: Negative for back pain and neck pain.  Negative for swelling and trauma.;  Skin: . Negative for pruritus, rash, abrasions, bruising and skin lesion.; ulcerations Neuro: Negative for headache, lightheadedness and neck stiffness. Negative for weakness, altered level of consciousness , altered mental status, extremity weakness, burning feet, involuntary movement, seizure and syncope.  Psych: negative for anxiety, depression, insomnia, tearfulness, panic attacks, hallucinations, paranoia, suicidal or homicidal ideation    Past Medical History  Diagnosis Date  . Hyperlipidemia     History reviewed. No pertinent past surgical history.  Medications:  HOME MEDS: Prior to Admission medications   Medication Sig Start Date End Date Taking? Authorizing Provider  ALPRAZolam (XANAX) 1 MG tablet TAKE ONE - HALF TABLET  EVERY 6 HOURS PRN Patient taking differently: Take 0.5 mg by mouth every 6 (six) hours as needed.  03/15/15  Yes [name], MD  HYDROcodone-acetaminophen ([place]kin) 7.5-325 MG per tablet TAKE ONE TABLET BY MOUTH FOUR TIMES DAILY AS NEEDED. Patient taking differently: Take 1 tablet by mouth every 6 (six) hours as needed for moderate pain or severe pain.  03/10/15  Yes [name], MD  nortriptyline (PAMELOR) 50 MG capsule TAKE 3 CAPSULES BY MOUTH AT BEDTIME. 03/24/15  Yes [name], MD  omeprazole (PRILOSEC) 40 MG capsule TAKE (1) CAPSULE BY MOUTH ONCE A DAY FOR ACID REFLUX. 04/01/15  Yes Mikey Kirschner, MD  temazepam (RESTORIL) 30 MG capsule TAKE (1) CAPSULE BY MOUTH AT BEDTIME AS NEEDED. Patient taking differently: TAKE (1) CAPSULE BY MOUTH AT BEDTIME AS NEEDED FOR SLEEP 03/04/15  Yes Mikey Kirschner, MD     Allergies:  Allergies  Allergen Reactions  .  Benadryl [Diphenhydramine Hcl] Other (See Comments)    Nervousness, insomnia    Social History:   reports that he has been smoking Cigarettes.  He does not have any smokeless tobacco history on file. His alcohol and drug histories are not on file.  Family  History: History reviewed. No pertinent family history.   Physical Exam: Filed Vitals:   04/29/15 2045 04/29/15 2115 04/29/15 2145 04/29/15 2233  BP: 133/90 135/93 130/89 140/94  Pulse: 101 102 105 108  Temp:    98.6 F (37 C)  TempSrc:    Oral  Resp: '29 20 22 20  '$ SpO2: 100% 100% 100% 98%   Blood pressure 140/94, pulse 108, temperature 98.6 F (37 C), temperature source Oral, resp. rate 20, SpO2 98 %.  GEN:  Pleasant patient lying in the stretcher in no acute distress; cooperative with exam. PSYCH:  alert and oriented x4; does not appear anxious or depressed; affect is appropriate. HEENT: Mucous membranes pink and anicteric; PERRLA; EOM intact; no cervical lymphadenopathy nor thyromegaly or carotid bruit; no JVD; There were no stridor. Neck is very supple. Breasts:: Not examined CHEST WALL: No tenderness CHEST: Normal respiration, clear to auscultation bilaterally.  HEART: Regular rate and rhythm.  There are no murmur, rub, or gallops.   BACK: No kyphosis or scoliosis; no CVA tenderness ABDOMEN: soft and non-tender; no masses, no organomegaly, normal abdominal bowel sounds; no pannus; no intertriginous candida. There is no rebound and no distention. Rectal Exam: Not done EXTREMITIES: No bone or joint deformity; age-appropriate arthropathy of the hands and knees; no edema; no ulcerations.  There is no calf tenderness. Genitalia: not examined PULSES: 2+ and symmetric SKIN: Normal hydration no rash or ulceration CNS: Cranial nerves 2-12 grossly intact no focal lateralizing neurologic deficit.  Speech is fluent; uvula elevated with phonation, facial symmetry and tongue midline. DTR are normal bilaterally, cerebella exam is intact, barbinski is negative and strengths are equaled bilaterally.  No sensory loss.   Labs on Admission:  Basic Metabolic Panel:  Recent Labs Lab 04/29/15 1823 04/29/15 2051  NA 137 135  K 3.7 3.8  CL 91* 99*  CO2 15* 26  GLUCOSE 163* 117*  BUN 13 12   CREATININE 1.59* 1.27*  CALCIUM 9.8 8.0*   Liver Function Tests:  Recent Labs Lab 04/29/15 1823  AST 30  ALT 14*  ALKPHOS 94  BILITOT 0.8  PROT 9.8*  ALBUMIN 4.7   CBC:  Recent Labs Lab 04/29/15 1929  WBC 10.2  NEUTROABS 8.2*  HGB 14.0  HCT 40.6  MCV 93.1  PLT 295   Cardiac Enzymes:  Recent Labs Lab 04/29/15 1823  CKTOTAL 60  TROPONINI <0.03    CBG:  Recent Labs Lab 04/29/15 1848  GLUCAP 209*     Radiological Exams on Admission: Ct Head Wo Contrast  04/29/2015   CLINICAL DATA:  Patient removed from vehicle, unresponsive. Question of seizure. Tachycardia. Initial encounter.  EXAM: CT HEAD WITHOUT CONTRAST  TECHNIQUE: Contiguous axial images were obtained from the base of the skull through the vertex without intravenous contrast.  COMPARISON:  CT of the head performed 07/04/2009, and MRI of the brain performed 08/05/2009  FINDINGS: There is no evidence of acute infarction, mass lesion, or intra- or extra-axial hemorrhage on CT.  Prominence of the sulci suggest mild cortical volume loss. Mild cerebellar atrophy is noted. Mild periventricular white matter change likely reflects small vessel ischemic microangiopathy.  The brainstem and fourth ventricle are within normal limits. The basal ganglia are  unremarkable in appearance. The cerebral hemispheres demonstrate grossly normal gray-white differentiation. No mass effect or midline shift is seen.  There is no evidence of fracture; visualized osseous structures are unremarkable in appearance. The orbits are within normal limits. The paranasal sinuses and mastoid air cells are well-aerated. No significant soft tissue abnormalities are seen.  IMPRESSION: 1. No evidence of traumatic intracranial injury or fracture. 2. Mild small vessel ischemic microangiopathy and mild cortical volume loss.   Electronically Signed   By: Garald Balding M.D.   On: 04/29/2015 19:42    EKG: Independently reviewed.    Assessment/Plan Present  on Admission:  . Altered mental status Possible Seizure. PSVT.   PLAN:  Will admit him into telemetry for OBS.  Will check TSH, along with obtaining an ECHO in case it was a syncopal episode from tachyarrythmia.   I am not convinced it was a seizure, but it is very difficult to know for sure.  Will obtain EEG if it is available, and will proceed with MRI with and without contrast of the brain as well.  He is stable, and will be admitted OBS under hospitalist service.   Thank you for allowing me to participate in his care.   Seizure precautions discussed including avoid operating dangerous machinery, driving (he doesn't drive), swimming or taking bath by himself, and high places that he can fall.    Other plans as per orders.  Code Status: FULL Haskel Khan, MD. [facility] Pager [phone] 7pm to 7am.  04/29/2015, 11:51 PM

## 2015-04-29 NOTE — ED Notes (Signed)
Dr Le at bedside,  

## 2015-04-29 NOTE — ED Notes (Signed)
Pt alert, oriented, able to answer questions, family at bedside,

## 2015-04-29 NOTE — ED Notes (Signed)
Pt arrived to er after eating at [name] due to "shaking all over", pt slow to answer questions, at times is confused, no mouth trauma or incontinence noted, family reports that pt has not been eating and drinking well, was at [place] today visiting his mother and when he returned from smoking family reports that his face was "beet red", pt states that he feels dry, skin clammy to touch,

## 2015-04-29 NOTE — ED Notes (Signed)
Pt unable to give urine sample at present time, is more aware of what happened today, still confused on time period

## 2015-04-29 NOTE — ED Notes (Signed)
Pt states that he feels like he is not able to go home, Dr Lacinda Axon notified,

## 2015-04-29 NOTE — ED Notes (Signed)
Dr. Ward at bedside.

## 2015-04-29 NOTE — ED Notes (Signed)
Pt removed from vehicle unresponsive, family thinks pt may have had a seizure, pt does not appear post ictal, no loss of bowel or bladder, pt responds to speech, extremely tachycardic at 150.

## 2015-04-30 ENCOUNTER — Observation Stay (HOSPITAL_COMMUNITY): Payer: Medicare Other

## 2015-04-30 ENCOUNTER — Observation Stay (HOSPITAL_BASED_OUTPATIENT_CLINIC_OR_DEPARTMENT_OTHER): Payer: Medicare Other

## 2015-04-30 ENCOUNTER — Encounter (HOSPITAL_COMMUNITY): Payer: Self-pay | Admitting: *Deleted

## 2015-04-30 DIAGNOSIS — R55 Syncope and collapse: Secondary | ICD-10-CM | POA: Diagnosis not present

## 2015-04-30 DIAGNOSIS — F411 Generalized anxiety disorder: Secondary | ICD-10-CM | POA: Diagnosis not present

## 2015-04-30 DIAGNOSIS — N179 Acute kidney failure, unspecified: Secondary | ICD-10-CM | POA: Diagnosis not present

## 2015-04-30 DIAGNOSIS — R4182 Altered mental status, unspecified: Secondary | ICD-10-CM

## 2015-04-30 DIAGNOSIS — I471 Supraventricular tachycardia, unspecified: Secondary | ICD-10-CM | POA: Diagnosis present

## 2015-04-30 DIAGNOSIS — R569 Unspecified convulsions: Secondary | ICD-10-CM | POA: Diagnosis not present

## 2015-04-30 DIAGNOSIS — R531 Weakness: Secondary | ICD-10-CM | POA: Diagnosis not present

## 2015-04-30 DIAGNOSIS — F1290 Cannabis use, unspecified, uncomplicated: Secondary | ICD-10-CM | POA: Diagnosis present

## 2015-04-30 LAB — TSH: TSH: 2.31 u[IU]/mL (ref 0.350–4.500)

## 2015-04-30 MED ORDER — HYDROCODONE-ACETAMINOPHEN 7.5-325 MG PO TABS
1.0000 | ORAL_TABLET | Freq: Four times a day (QID) | ORAL | Status: DC | PRN
  Administered 2015-04-30 (×3): 1 via ORAL
  Filled 2015-04-30 (×3): qty 1

## 2015-04-30 MED ORDER — POTASSIUM CHLORIDE IN NACL 20-0.9 MEQ/L-% IV SOLN
INTRAVENOUS | Status: DC
  Administered 2015-04-30: 12:00:00 via INTRAVENOUS

## 2015-04-30 MED ORDER — PANTOPRAZOLE SODIUM 40 MG PO TBEC
40.0000 mg | DELAYED_RELEASE_TABLET | Freq: Every day | ORAL | Status: DC
  Administered 2015-04-30: 40 mg via ORAL
  Filled 2015-04-30: qty 1

## 2015-04-30 MED ORDER — ALPRAZOLAM 0.5 MG PO TABS
0.5000 mg | ORAL_TABLET | Freq: Four times a day (QID) | ORAL | Status: DC
  Administered 2015-04-30 (×3): 0.5 mg via ORAL
  Filled 2015-04-30 (×3): qty 1

## 2015-04-30 MED ORDER — TEMAZEPAM 15 MG PO CAPS: 30.0000 mg | ORAL_CAPSULE | Freq: Every evening | ORAL | Status: DC | PRN

## 2015-04-30 MED ORDER — ENSURE ENLIVE PO LIQD
237.0000 mL | Freq: Three times a day (TID) | ORAL | Status: DC
  Administered 2015-04-30: 237 mL via ORAL

## 2015-04-30 MED ORDER — NORTRIPTYLINE HCL 50 MG PO CAPS: ORAL_CAPSULE | ORAL | Status: DC

## 2015-04-30 MED ORDER — GADOBENATE DIMEGLUMINE 529 MG/ML IV SOLN: 7.0000 mL | Freq: Once | INTRAVENOUS | Status: DC | PRN

## 2015-04-30 MED ORDER — HEPARIN SODIUM (PORCINE) 5000 UNIT/ML IJ SOLN
5000.0000 [IU] | Freq: Three times a day (TID) | INTRAMUSCULAR | Status: DC
Start: 2015-04-30 — End: 2015-04-30
  Administered 2015-04-30 (×2): 5000 [IU] via SUBCUTANEOUS
  Filled 2015-04-30 (×2): qty 1

## 2015-04-30 MED ORDER — SODIUM CHLORIDE 0.9 % IJ SOLN
3.0000 mL | Freq: Two times a day (BID) | INTRAMUSCULAR | Status: DC
  Administered 2015-04-30: 3 mL via INTRAVENOUS

## 2015-04-30 MED ORDER — ALPRAZOLAM 1 MG PO TABS: ORAL_TABLET | ORAL | Status: DC

## 2015-04-30 MED ORDER — ALPRAZOLAM 0.5 MG PO TABS
0.5000 mg | ORAL_TABLET | Freq: Four times a day (QID) | ORAL | Status: DC | PRN
  Administered 2015-04-30: 0.5 mg via ORAL
  Filled 2015-04-30: qty 1

## 2015-04-30 MED ORDER — GADOBENATE DIMEGLUMINE 529 MG/ML IV SOLN
14.0000 mL | Freq: Once | INTRAVENOUS | Status: AC | PRN
  Administered 2015-04-30: 14 mL via INTRAVENOUS

## 2015-04-30 NOTE — Progress Notes (Signed)
*  PRELIMINARY RESULTS* Echocardiogram 2D Echocardiogram has been performed.  Leavy Cella 04/30/2015, 4:23 PM

## 2015-04-30 NOTE — Progress Notes (Signed)
Pt's IV catheter removed and intact. Pt's IV site clean dry and intact. Discharge instructions reviewed and discussed with patient. Follow up appointments and medications reviewed and discussed with patient. All questions answered and no further questions at this time. Pt in stable condition and in no acute distress at time of discharge. Pt escorted by nurse tech.

## 2015-04-30 NOTE — Discharge Summary (Signed)
Physician Discharge Summary  ADRIAAN MALTESE NLZ:[id number] DOB: 09-06-1951 DOA: 04/29/2015  PCP: Sallee Lange, MD  Admit date: 04/29/2015 Discharge date: 04/30/2015  Time spent: Greater than 30 minutes  Recommendations for Outpatient Follow-up:  1. Patient was instructed to follow-up with Dr. Merlene Laughter for chronic migraine headaches and for evaluation with an EEG. 2. Recommend further close monitoring of the patient's psychotropic medications. 3. Recommend follow-up of the patient's renal function and venous glucose.   Discharge Diagnoses:  1. Acute encephalopathy with associated shaking episode. 2. 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 3. SVT. 4. Acute kidney injury. 5. Mild hyperglycemia. 6. Chronic low back pain. 7. Generalized anxiety disorder. 8. Chronic insomnia. 9. 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  Discharge Condition: Improved.  Diet recommendation: Heart healthy.  Filed Weights   04/30/15 0100  Weight: 68.3 kg (150 lb 9.2 oz)    History of present illness:  The patient is a 64 year old man with a history of severe generalized anxiety disorder, chronic low back pain, and chronic insomnia who presented to the emergency department on 04/29/2015 after a report of altered mental status with a shaking episode. His family reported that the patient became confused and was seen shaking. Following the shaking episode, he appeared sleepy. There was no evidence of tongue biting, bladder or bowel incontinence. He denied any prior history of a seizure disorder. In the ED, he was afebrile. His heart rate was in the 140s on admission and the EKG was suggestive of sinus tachycardia versus SVT. His blood pressure was within normal limits. He was oxygenating in the 90s on room air. His urine drug screen was positive for benzodiazepine's and THC. His lab data were significant for a creatinine of 1.59, glucose of 163, and normal WBC. His urinalysis was unremarkable. CT of the  head revealed no evidence of traumatic intracranial injury, but with mild small vessel ischemic changes. He was admitted for further evaluation and management.   Hospital Course:  The patient was started on IV fluids for hydration. He was given a total of 2 L of normal saline in the ED. His baseline creatinine 1 year ago was within normal limits. Because of SVT and associated acute renal injury, it was felt that the patient was volume contracted. Following the 2 L of oral saline, his heart rate normalized. For further evaluation, a number studies were ordered. -MRI of his brain was essentially negative. -2-D echocardiogram revealed a normal study with an EF of 55-60%. -His TSH was within normal limits at 2.3. -Follow-up venous glucose was 117. -EEG was canceled, but could be scheduled electively in the outpatient setting.  The patient was asked about marijuana use. He stated that he smoked marijuana occasionally. He denied the marijuana being laced with any other drugs. He was encouraged to stop smoking marijuana. In also asked him about his medications, specifically how he was taking alprazolam, Pamelor, and hydrocodone. He stated he was taking his medications as prescribed, but appeared to be unsure if he had missed any doses or if he had taken too many.  Following my conversation with the patient, there was a suspicion that he was not taking his medications correctly or perhaps he had missed doses of Xanax which could've called a withdrawal syndrome. This was in the setting of volume depletion/volume contraction and acute kidney injury which may have decreased clearance of these medications. This was completely unclear. For sake of decreasing CNS side effects, Pamelor was decreased to 100 mg daily  down from 150 mg. He was advised to continue Xanax, but to avoid abruptly missing any doses.  The patient's renal function improved with a creatinine of 1.27 at the time of discharge. He remained alert  and oriented and without any evidence of shaking, delirium, or encephalopathy. Recommend follow-up with his PCP to discuss further management of his psychotropic medications; recommend follow-up with Dr. Merlene Laughter for consideration of an outpatient EEG.   Procedures: 2-D echocardiogram Study Conclusions - Left ventricle: The cavity size was normal. Systolic function was normal. The estimated ejection fraction was in the range of 55% to 60%. Wall motion was normal; there were no regional wall motion abnormalities. Left ventricular diastolic function parameters were normal. - Aortic valve: There was no regurgitation. - Aortic root: The aortic root was normal in size. - Mitral valve: There was no regurgitation. - Left atrium: The atrium was normal in size. - Right ventricle: Systolic function was normal. - Right atrium: The atrium was normal in size. - Tricuspid valve: There was no regurgitation. - Pulmonic valve: There was no regurgitation. - Pulmonary arteries: Systolic pressure was within the normal range. - Inferior vena cava: The vessel was normal in size. - Pericardium, extracardiac: There was no pericardial effusion. Impressions: - Normal study.  Consultations:  None  Discharge Exam: Filed Vitals:   04/30/15 0648  BP: 132/89  Pulse: 95  Temp: 98.4 F (36.9 C)  Resp: 20   oxygen saturation 95% on room air.  General: 64 year old Caucasian man sitting up in bed, alert, in no acute distress. Cardiovascular: S1, S2, with no murmurs rubs or gallops. Respiratory: Clear to auscultation bilaterally. Neurologic: He is alert and oriented 3. Cranial nerves II through XII are intact. No nystagmus. No pronator drift. His speech is clear.  Discharge Instructions   Discharge Instructions    Diet general    Complete by:  As directed      Discharge instructions    Complete by:  As directed   Some of your medications have changed. The Xanax (alprazolam) was changed to  half a tablet every 6 hours scheduled rather than as needed. The Pamelor was decreased to 2 tablets at bedtime rather than 3 tablets. Please discuss your medications further with your primary care physician. Ask your physician to schedule an outpatient EEG if applicable. Do not drive until you are cleared by your primary care physician. Avoid dehydration.     Driving Restrictions    Complete by:  As directed   Do not drive until you're cleared by your primary care physician.     Increase activity slowly    Complete by:  As directed           Current Discharge Medication List    CONTINUE these medications which have CHANGED   Details  ALPRAZolam (XANAX) 1 MG tablet Take a half a tablet every 6 hours scheduled without missing any doses.    nortriptyline (PAMELOR) 50 MG capsule Take 2 capsules by mouth at bedtime.      CONTINUE these medications which have NOT CHANGED   Details  HYDROcodone-acetaminophen ([place]CO) 7.5-325 MG per tablet TAKE ONE TABLET BY MOUTH FOUR TIMES DAILY AS NEEDED. Qty: 120 tablet, Refills: 0    omeprazole (PRILOSEC) 40 MG capsule TAKE (1) CAPSULE BY MOUTH ONCE A DAY FOR ACID REFLUX. Qty: 30 capsule, Refills: 5    temazepam (RESTORIL) 30 MG capsule TAKE (1) CAPSULE BY MOUTH AT BEDTIME AS NEEDED. Qty: 30 capsule, Refills: 5  Allergies  Allergen Reactions  . Asa [Aspirin] Other (See Comments)    Intolerance to aspirin due to ulcer  . Benadryl [Diphenhydramine Hcl] Other (See Comments)    Nervousness, insomnia   Follow-up Information    Follow up with [facility], KOFI, MD. Schedule an appointment as soon as possible for a visit in 2 weeks.   Specialty:  Neurology   Why:  Call for a neulogy appontment in 1-2 weeks   Contact information:   [address] [place] [facility] [address] [phone]       Follow up with Sallee Lange, MD. Schedule an appointment as soon as possible for a visit in 3 days.   Specialty:  Family Medicine   Why:  For follow up of  your symptoms and medication management.   Contact information:   [place] [address] [phone]        The results of significant diagnostics from this hospitalization (including imaging, microbiology, ancillary and laboratory) are listed below for reference.    Significant Diagnostic Studies: Ct Head Wo Contrast  04/29/2015   CLINICAL DATA:  Patient removed from vehicle, unresponsive. Question of seizure. Tachycardia. Initial encounter.  EXAM: CT HEAD WITHOUT CONTRAST  TECHNIQUE: Contiguous axial images were obtained from the base of the skull through the vertex without intravenous contrast.  COMPARISON:  CT of the head performed 07/04/2009, and MRI of the brain performed 08/05/2009  FINDINGS: There is no evidence of acute infarction, mass lesion, or intra- or extra-axial hemorrhage on CT.  Prominence of the sulci suggest mild cortical volume loss. Mild cerebellar atrophy is noted. Mild periventricular white matter change likely reflects small vessel ischemic microangiopathy.  The brainstem and fourth ventricle are within normal limits. The basal ganglia are unremarkable in appearance. The cerebral hemispheres demonstrate grossly normal gray-white differentiation. No mass effect or midline shift is seen.  There is no evidence of fracture; visualized osseous structures are unremarkable in appearance. The orbits are within normal limits. The paranasal sinuses and mastoid air cells are well-aerated. No significant soft tissue abnormalities are seen.  IMPRESSION: 1. No evidence of traumatic intracranial injury or fracture. 2. Mild small vessel ischemic microangiopathy and mild cortical volume loss.   Electronically Signed   By: Garald Balding M.D.   On: 04/29/2015 19:42   Mr Jeri Cos CZ Contrast  04/30/2015   CLINICAL DATA:  Weakness for 3 days. Syncopal episode while driving on 66/06. Possible seizure.  EXAM: MRI HEAD WITHOUT AND WITH CONTRAST  TECHNIQUE: Multiplanar,  multiecho pulse sequences of the brain and surrounding structures were obtained without and with intravenous contrast.  CONTRAST:  9m MULTIHANCE GADOBENATE DIMEGLUMINE 529 MG/ML IV SOLN  COMPARISON:  CT head 04/29/2015.  MR head 08/05/2009.  FINDINGS: No evidence for acute infarction, hemorrhage, mass lesion, hydrocephalus, or extra-axial fluid. Normal cerebral volume. No white matter disease.  Flow voids are maintained throughout the carotid, basilar, and vertebral arteries. There are no areas of chronic hemorrhage. Pituitary, pineal, and cerebellar tonsils unremarkable. No upper cervical lesions.  Post infusion, no abnormal enhancement of the brain or meninges. Visualized calvarium, skull base, and upper cervical osseous structures unremarkable. Scalp and extracranial soft tissues, orbits, sinuses, and mastoids show no acute process.  Similar appearance priors.  IMPRESSION: Negative exam.  No acute or focal intracranial abnormality.  No features on diffusion-weighted imaging to suggest a postictal phenomenon.   Electronically Signed   By: JRolla FlattenM.D.   On: 04/30/2015 12:26  Microbiology: No results found for this or any previous visit (from the past 240 hour(s)).   Labs: Basic Metabolic Panel:  Recent Labs Lab 04/29/15 1823 04/29/15 2051  NA 137 135  K 3.7 3.8  CL 91* 99*  CO2 15* 26  GLUCOSE 163* 117*  BUN 13 12  CREATININE 1.59* 1.27*  CALCIUM 9.8 8.0*   Liver Function Tests:  Recent Labs Lab 04/29/15 1823  AST 30  ALT 14*  ALKPHOS 94  BILITOT 0.8  PROT 9.8*  ALBUMIN 4.7   No results for input(s): LIPASE, AMYLASE in the last 168 hours. No results for input(s): AMMONIA in the last 168 hours. CBC:  Recent Labs Lab 04/29/15 1929  WBC 10.2  NEUTROABS 8.2*  HGB 14.0  HCT 40.6  MCV 93.1  PLT 295   Cardiac Enzymes:  Recent Labs Lab 04/29/15 1823  CKTOTAL 60  TROPONINI <0.03   BNP: BNP (last 3 results) No results for input(s): BNP in the last 8760  hours.  ProBNP (last 3 results) No results for input(s): PROBNP in the last 8760 hours.  CBG:  Recent Labs Lab 04/29/15 1848  GLUCAP 209*       Signed:  Alexsis Kathman  [facility] 04/30/2015, 4:42 PM

## 2015-04-30 NOTE — Progress Notes (Signed)
Initial Nutrition Assessment  DOCUMENTATION CODES: Not applicable  INTERVENTION: -Recommend Re-weighing pt. He would appear to have had a very significant weight loss in a short time  -Ensure Enlive po TID, each supplement provides 350 kcal and 20 grams of protein  NUTRITION DIAGNOSIS: Unintentional weight loss related to unclear etiology as evidenced by what initially would appear as a loss of 8.5% in 1.5 months  GOAL: Patient will meet greater than or equal to 90% of their needs  MONITOR: PO intake, Supplement acceptance, Weight trends, Labs, I & O's, NFPE, Hx of oral intake, Hx of weight loss  REASON FOR ASSESSMENT: Malnutrition Screening Tool  ASSESSMENT: 64 y.o. male with hx of right BKA, severe anxiety disorder on low dose Xanax, rare alcohol drinker, hx of migraine, brought to the ER as he was seen shaking, followed by confusion and sleepiness. UDS showed THC+ Benzo  Per notes: No mention of weight loss or oral intake.   Initially, would appear to  have lost 15 lbs in the past 1.5 months? Ask for pt to be reweighed  Height: Ht Readings from Last 1 Encounters:  04/30/15 6' (1.829 m)    Weight: Wt Readings from Last 1 Encounters:  04/30/15 150 lb 9.2 oz (68.3 kg)    Ideal Body Weight:  80.9 kg  Wt Readings from Last 10 Encounters:  04/30/15 150 lb 9.2 oz (68.3 kg)  03/10/15 164 lb (74.39 kg)  12/08/14 172 lb 6 oz (78.189 kg)  09/14/14 168 lb 2 oz (76.261 kg)  06/14/14 168 lb (76.204 kg)  02/18/14 177 lb (80.287 kg)  11/20/13 178 lb 3.2 oz (80.831 kg)  07/10/13 176 lb 6.4 oz (80.015 kg)  Usual bw appears to be ~170 lbs.   BMI:  Body mass index is 20.42 kg/(m^2).  Estimated Nutritional Needs: Kcal:  1850-2100 (28-31 kcal/kg)  Protein:  68-82 g Pro (1-1.2 g/kg) Fluid:  1.9-2.1 liters  Skin: WDL   Diet Order:  Diet regular Room service appropriate?: Yes; Fluid consistency:: Thin  EDUCATION NEEDS: No education needs identified at this time  No intake  or output data in the 24 hours ending 04/30/15 1243  Last BM:  5/27  Burtis Junes RD, LDN Nutrition Pager: [phone] 04/30/2015 12:43 PM

## 2015-04-30 NOTE — Progress Notes (Signed)
Pt encouraged to ambulate. Pt stated that he does not want to walk. Pt states that his leg with RT BKA feels uncomfortable even with prosthesis. Will continue to encourage patient to ambulate throughout the shift.

## 2015-05-06 ENCOUNTER — Ambulatory Visit: Payer: Medicare Other | Admitting: Family Medicine

## 2015-05-10 ENCOUNTER — Encounter: Payer: Self-pay | Admitting: Family Medicine

## 2015-05-10 ENCOUNTER — Ambulatory Visit (INDEPENDENT_AMBULATORY_CARE_PROVIDER_SITE_OTHER): Payer: Medicare Other | Admitting: Family Medicine

## 2015-05-10 VITALS — BP 128/84 | Ht 72.0 in | Wt 154.4 lb

## 2015-05-10 DIAGNOSIS — F411 Generalized anxiety disorder: Secondary | ICD-10-CM | POA: Diagnosis not present

## 2015-05-10 DIAGNOSIS — G8929 Other chronic pain: Secondary | ICD-10-CM | POA: Diagnosis not present

## 2015-05-10 DIAGNOSIS — M549 Dorsalgia, unspecified: Secondary | ICD-10-CM

## 2015-05-10 DIAGNOSIS — R4182 Altered mental status, unspecified: Secondary | ICD-10-CM

## 2015-05-10 NOTE — Progress Notes (Signed)
   Subjective:    Patient ID: [name], male    DOB: 26-Jun-1951, 64 y.o.   MRN: [id number]  HPI Patient is here today for a ER follow up visit. Patient was seen at [facility] ER on 04/29/15 for confusion and shaking spells. Patient is doing a lot better now.   Patient states that he has no other concerns at this time.  ER notes were reviewed in detail. MRI reviewed lab work reviewed. Patient states he was taken his medicine as directed did not have any problems until this happened family witnessed some shaking and he was out of it for good 60 minutes then he seemed to be more with it  Review of Systems  Constitutional: Negative for activity change, appetite change and fatigue.  HENT: Negative for congestion.   Respiratory: Negative for cough.   Cardiovascular: Negative for chest pain.  Gastrointestinal: Negative for abdominal pain.  Endocrine: Negative for polydipsia and polyphagia.  Neurological: Negative for weakness.  Psychiatric/Behavioral: Negative for confusion.   25 minutes spent with the patient discussing his medications discussing the possibility of seizures I doubt that he had withdrawal to his medication he stated that he was taking it appropriately     Objective:   Physical Exam  Constitutional: He appears well-nourished. No distress.  Cardiovascular: Normal rate, regular rhythm and normal heart sounds.   No murmur heard. Pulmonary/Chest: Effort normal and breath sounds normal. No respiratory distress.  Musculoskeletal: He exhibits no edema.  Lymphadenopathy:    He has no cervical adenopathy.  Neurological: He is alert.  Psychiatric: His behavior is normal.  Vitals reviewed.         Assessment & Plan:  Recent altered mental status he is doing better now he denies misusing medicines he denies combining medicines are taking Aleve  He denied recent illness or other problems at that time.  He denied missing his nerve pills.  It is concerning for the possibility of  a seizure I believe the patient needs EEG as well as neurologically consultation. Await all this. He will follow-up in June for his pain management follow-up.

## 2015-05-11 ENCOUNTER — Encounter: Payer: Self-pay | Admitting: Family Medicine

## 2015-05-11 LAB — BASIC METABOLIC PANEL
BUN/Creatinine Ratio: 9 — ABNORMAL LOW (ref 10–22)
BUN: 8 mg/dL (ref 8–27)
CO2: 28 mmol/L (ref 18–29)
Calcium: 9.8 mg/dL (ref 8.6–10.2)
Chloride: 96 mmol/L — ABNORMAL LOW (ref 97–108)
Creatinine, Ser: 0.86 mg/dL (ref 0.76–1.27)
GFR, EST AFRICAN AMERICAN: 108 mL/min/{1.73_m2} (ref >59)
GFR, EST NON AFRICAN AMERICAN: 94 mL/min/{1.73_m2} (ref >59)
Glucose: 75 mg/dL (ref 65–99)
Potassium: 4.7 mmol/L (ref 3.5–5.2)
SODIUM: 140 mmol/L (ref 134–144)

## 2015-05-11 LAB — MAGNESIUM: MAGNESIUM: 2 mg/dL (ref 1.6–2.3)

## 2015-05-30 ENCOUNTER — Telehealth: Payer: Self-pay | Admitting: Family Medicine

## 2015-05-30 NOTE — Telephone Encounter (Signed)
Pt was referred to Dr. Merlene Laughter for his seizures but their EEG machine is broken and don't know when it will be fixed. Pt was told by them to have Dr. Nicki Reaper put in a referral for an EEG at [facility]. Pt wants to know what he should do.

## 2015-05-30 NOTE — Telephone Encounter (Signed)
Left message to return call 

## 2015-05-30 NOTE — Telephone Encounter (Signed)
I believe the patient can wait for August but if if he is feeling worried about it and certainly set him up to be seen in [place]. If the patient chooses [name] go ahead with putting in the referral to neurology for evaluation of a possible seizure

## 2015-05-30 NOTE — Telephone Encounter (Signed)
Patient has appt 07/25/15 at 3pm with Dr Merlene Laughter and they will have there new eeg machine in house at that time

## 2015-05-30 NOTE — Telephone Encounter (Signed)
Patient notified of appt. Patient to stick with Dr Merlene Laughter who he has seen in the past-warning signs discussed -to go to ER if continued problems.

## 2015-06-09 ENCOUNTER — Ambulatory Visit: Payer: Medicare Other | Admitting: Family Medicine

## 2015-06-10 ENCOUNTER — Ambulatory Visit (INDEPENDENT_AMBULATORY_CARE_PROVIDER_SITE_OTHER): Payer: Medicare Other | Admitting: Family Medicine

## 2015-06-10 ENCOUNTER — Encounter: Payer: Self-pay | Admitting: Family Medicine

## 2015-06-10 VITALS — BP 118/84 | Ht 72.0 in | Wt 151.0 lb

## 2015-06-10 DIAGNOSIS — F411 Generalized anxiety disorder: Secondary | ICD-10-CM

## 2015-06-10 DIAGNOSIS — G4700 Insomnia, unspecified: Secondary | ICD-10-CM | POA: Diagnosis not present

## 2015-06-10 DIAGNOSIS — G8929 Other chronic pain: Secondary | ICD-10-CM | POA: Diagnosis not present

## 2015-06-10 DIAGNOSIS — M549 Dorsalgia, unspecified: Secondary | ICD-10-CM

## 2015-06-10 DIAGNOSIS — Z79891 Long term (current) use of opiate analgesic: Secondary | ICD-10-CM

## 2015-06-10 MED ORDER — HYDROCODONE-ACETAMINOPHEN 7.5-325 MG PO TABS: ORAL_TABLET | ORAL | Status: DC

## 2015-06-10 MED ORDER — ALPRAZOLAM 1 MG PO TABS: ORAL_TABLET | ORAL | Status: DC

## 2015-06-10 NOTE — Patient Instructions (Signed)

## 2015-06-10 NOTE — Progress Notes (Signed)
   Subjective:    Patient ID: [name], male    DOB: [birth year], 64 y.o.   MRN: [id number]  HPI  This patient was seen today for chronic pain  The medication list was reviewed and updated.   -Compliance with pain medication: Yes  The patient was advised the importance of maintaining medication and not using illegal substances with these.  Refills needed: Yes  The patient was educated that we can provide 3 monthly scripts for their medication, it is their responsibility to follow the instructions.  Side effects or complications from medications: none  Patient is aware that pain medications are meant to minimize the severity of the pain to allow their pain levels to improve to allow for better function. They are aware of that pain medications cannot totally remove their pain.  Due for UDT ( at least once per year) : today  Patient states reflexes been under good control He has chronic insomnia he uses Restoril for this He has chronic anxiety issues for which uses Xanax He has chronic lumbar back pain as well as amputation pain for which he uses pain medication anywhere between 2 and 4 times per day   Patient denies having any difficulty with cognitive issues or any seizure-like disorder recently was in the hospital they are planning on doing the August   Review of Systems  Constitutional: Negative for activity change.  Gastrointestinal: Negative for vomiting and abdominal pain.  Neurological: Negative for weakness.  Psychiatric/Behavioral: Negative for confusion.       Objective:   Physical Exam  Constitutional: He appears well-nourished.  Cardiovascular: Normal rate, regular rhythm and normal heart sounds.   No murmur heard. Pulmonary/Chest: Effort normal and breath sounds normal.  Musculoskeletal: He exhibits no edema.  Lymphadenopathy:    He has no cervical adenopathy.  Neurological: He is alert.  Psychiatric: His behavior is normal.  Vitals  reviewed.         Assessment & Plan:  This patient suffers with significant anxiety issues and feels stressed a lot. The Xanax he is been on for years I instructed the patient he may use the half tablet up to 4 times a day but is not to use the medication at bedtime he denies that this medicine causes any drowsiness or difficulty with sleepiness or difficulty with breathing  Chronic pain and discomfort I discussed pain contract with the patient also discussed urine drug testing also discussed importance of taking the medicine as prescribed patient states he takes anywhere between 2 and 4 per day he was instructed not to overuse of medication denies a causing any drowsiness is primarily for his back pain but also from his amputation pain  Patient does smoke he knows he needs to quit smoking  Reflux under good control current medication.

## 2015-06-17 LAB — TOXASSURE SELECT 13 (MW), URINE: PDF: 0

## 2015-08-05 ENCOUNTER — Other Ambulatory Visit: Payer: Self-pay | Admitting: Family Medicine

## 2015-08-05 NOTE — Telephone Encounter (Signed)
One mo only in scotts name

## 2015-08-22 ENCOUNTER — Telehealth: Payer: Self-pay | Admitting: Family Medicine

## 2015-08-22 NOTE — Telephone Encounter (Signed)
Pt's sisters are wanting an appt to sit down and talk with the Dr. About the pt's disability. Pt is currently homeless and is not taking care of his self. Sister states that he is incompetent of taking care of his self financially and mentally. Sisters are on HIPAA form in system.   Pt also dropped off a form to be filled out for the [employer] for the pt to be able to get housing.

## 2015-08-22 NOTE — Telephone Encounter (Signed)
I can see them this week or next week I would prefer it to be any day but Friday

## 2015-08-23 NOTE — Telephone Encounter (Signed)
LMRC

## 2015-08-25 ENCOUNTER — Ambulatory Visit (INDEPENDENT_AMBULATORY_CARE_PROVIDER_SITE_OTHER): Payer: Medicare Other | Admitting: Family Medicine

## 2015-08-25 ENCOUNTER — Encounter: Payer: Self-pay | Admitting: Family Medicine

## 2015-08-25 DIAGNOSIS — G8929 Other chronic pain: Secondary | ICD-10-CM

## 2015-08-25 DIAGNOSIS — M549 Dorsalgia, unspecified: Secondary | ICD-10-CM | POA: Diagnosis not present

## 2015-08-25 DIAGNOSIS — R400 Somnolence: Secondary | ICD-10-CM

## 2015-08-25 NOTE — Progress Notes (Signed)
   Subjective:    Patient ID: [name], male    DOB: 1950/12/15, 64 y.o.   MRN: [id number]  HPI Patient's sisters are here today for a consultation with the doctor about the patient. Sister's names are Debe Coder and [name].    Review of Systems     Objective:   Physical Exam  Family was told that if patient becomes despondent or destructive to himself or others immediately go to ER      Assessment & Plan:  25 minutes was spent with family. Patient is having significant mental health problems in lives in terrible home situation. He is currently staying in a hotel because he was evicted. I am not certain that the patient will willingly go for mental health treatment. I recommended patient be brought for evaluation. I also called Adult Protective Services. They will reach out to the patient to see you he would willingly accept help

## 2015-08-31 ENCOUNTER — Encounter: Payer: Self-pay | Admitting: Family Medicine

## 2015-08-31 ENCOUNTER — Ambulatory Visit (INDEPENDENT_AMBULATORY_CARE_PROVIDER_SITE_OTHER): Payer: Medicare Other | Admitting: Family Medicine

## 2015-08-31 VITALS — BP 124/86 | Ht 72.0 in | Wt 144.2 lb

## 2015-08-31 DIAGNOSIS — F329 Major depressive disorder, single episode, unspecified: Secondary | ICD-10-CM

## 2015-08-31 DIAGNOSIS — G8929 Other chronic pain: Secondary | ICD-10-CM

## 2015-08-31 DIAGNOSIS — Z89512 Acquired absence of left leg below knee: Secondary | ICD-10-CM

## 2015-08-31 DIAGNOSIS — M549 Dorsalgia, unspecified: Secondary | ICD-10-CM | POA: Diagnosis not present

## 2015-08-31 DIAGNOSIS — F411 Generalized anxiety disorder: Secondary | ICD-10-CM

## 2015-08-31 DIAGNOSIS — F32A Depression, unspecified: Secondary | ICD-10-CM

## 2015-08-31 MED ORDER — TEMAZEPAM 30 MG PO CAPS: ORAL_CAPSULE | ORAL | Status: DC

## 2015-08-31 MED ORDER — OMEPRAZOLE 40 MG PO CPDR: DELAYED_RELEASE_CAPSULE | ORAL | Status: DC

## 2015-08-31 MED ORDER — NORTRIPTYLINE HCL 50 MG PO CAPS: ORAL_CAPSULE | ORAL | Status: DC

## 2015-08-31 MED ORDER — ALPRAZOLAM 1 MG PO TABS: ORAL_TABLET | ORAL | Status: DC

## 2015-08-31 NOTE — Progress Notes (Signed)
LMRC

## 2015-08-31 NOTE — Progress Notes (Signed)
   Subjective:    Patient ID: [name], male    DOB: 1951/03/23, 64 y.o.   MRN: [id number]  Hyperlipidemia This is a chronic problem. The current episode started more than 1 year ago. There are no compliance problems.    Patient states he is in today for a consult. Family is present with them. They're worried about him. His previous living conditions he was not getting food properly social services try to work with them.   Review of Systems He denies coughing vomiting chest pain shortness breath headaches states his leg is feeling good.    Objective:   Physical Exam Lungs clear heart regular amputation area doing well neck no masses HEENT benign  Apparently when the patient was in his own apartment he smoked in the apartment which was against the rules he was evicted because of this he also did not keep up the apartment did not have any food and had large amounts of rash. Patient denies abusing Xanax    Assessment & Plan:  Referral to psychiatry for depression. I believe the patient would benefit from ongoing psychiatric care continue current medications for now  Chronic headaches actually doing much better. Patient states he's not using pain medication currently I don't recommend that we continue it for now.  History left knee amputation actually doing fairly well with this encouraged him to stay physically active  Patient smokes he was counseled to quit  I believe the patient would benefit from being in an environment where he is around other people. If that is not possible around this area then being in an apartment where family has significant intervention with him would be wise  The patient was seen by social services Mr. Marshell Levan we will try to connect with him

## 2015-09-07 ENCOUNTER — Telehealth (HOSPITAL_COMMUNITY): Payer: Self-pay | Admitting: *Deleted

## 2015-09-08 ENCOUNTER — Telehealth: Payer: Self-pay | Admitting: Family Medicine

## 2015-09-08 ENCOUNTER — Ambulatory Visit: Payer: Medicare Other | Admitting: Family Medicine

## 2015-09-08 NOTE — Telephone Encounter (Signed)
Pt states he needs hydrocodone for his prostate. Told him I would  Send back a message to see if you would do this at this time or  Would he need to be seen instead. Pt not wanting to come in

## 2015-09-08 NOTE — Telephone Encounter (Signed)
[name] [facility] 10/6

## 2015-09-08 NOTE — Telephone Encounter (Signed)
Please shred previous prescriptions. Patient may have prescription for hydrocodone 5 mg/325, one every 6 hours when necessary pain caution drowsiness, #15

## 2015-09-08 NOTE — Telephone Encounter (Signed)
Pt states he last got rx for hydrocodone in July. We have his other two scripts in cabinet. He was suppose to have a follow up drug screen and did not. Having pain in his prostate. States he has had this problem for years. Cramping pain. No fever, no trouble urinating. Would like rx for hydrocodone for pain.

## 2015-09-09 ENCOUNTER — Other Ambulatory Visit: Payer: Self-pay | Admitting: *Deleted

## 2015-09-09 ENCOUNTER — Ambulatory Visit (INDEPENDENT_AMBULATORY_CARE_PROVIDER_SITE_OTHER): Payer: Medicare Other

## 2015-09-09 DIAGNOSIS — Z111 Encounter for screening for respiratory tuberculosis: Secondary | ICD-10-CM

## 2015-09-09 MED ORDER — HYDROCODONE-ACETAMINOPHEN 5-325 MG PO TABS: 1.0000 | ORAL_TABLET | Freq: Four times a day (QID) | ORAL | Status: DC | PRN

## 2015-09-09 NOTE — Telephone Encounter (Signed)
Script ready. Pt notified.

## 2015-09-12 ENCOUNTER — Telehealth: Payer: Self-pay | Admitting: Family Medicine

## 2015-09-12 LAB — TB SKIN TEST
Induration: 0 mm
TB SKIN TEST: NEGATIVE

## 2015-09-12 NOTE — Telephone Encounter (Signed)
An FL2 and TB skin test form were dropped off to be filled out. Message in box.

## 2015-09-13 NOTE — Telephone Encounter (Signed)
Form was completed please forward accordingly

## 2015-09-13 NOTE — Telephone Encounter (Signed)
Please fill in FL2 then Ill review and sign thank you

## 2015-09-16 ENCOUNTER — Telehealth: Payer: Self-pay | Admitting: Family Medicine

## 2015-09-16 NOTE — Telephone Encounter (Signed)
Facility dropped off some forms to be filled out and signed. Please call facility when forms are ready.

## 2015-09-18 NOTE — Telephone Encounter (Signed)
formc completed please forward accordingly

## 2015-09-28 ENCOUNTER — Telehealth: Payer: Self-pay | Admitting: Family Medicine

## 2015-09-28 NOTE — Telephone Encounter (Signed)
The facility needs an order for a walking cane and a shower chair faxed to [facility] at [phone].

## 2015-09-28 NOTE — Telephone Encounter (Signed)
Please do thanks due to amputation

## 2015-09-29 NOTE — Telephone Encounter (Signed)
Done

## 2015-10-13 ENCOUNTER — Ambulatory Visit (INDEPENDENT_AMBULATORY_CARE_PROVIDER_SITE_OTHER): Payer: Medicare Other | Admitting: Family Medicine

## 2015-10-13 VITALS — BP 112/74 | Wt 152.0 lb

## 2015-10-13 DIAGNOSIS — Z125 Encounter for screening for malignant neoplasm of prostate: Secondary | ICD-10-CM | POA: Diagnosis not present

## 2015-10-13 DIAGNOSIS — Z23 Encounter for immunization: Secondary | ICD-10-CM

## 2015-10-13 DIAGNOSIS — E785 Hyperlipidemia, unspecified: Secondary | ICD-10-CM

## 2015-10-13 DIAGNOSIS — Z79899 Other long term (current) drug therapy: Secondary | ICD-10-CM

## 2015-10-13 DIAGNOSIS — M79604 Pain in right leg: Secondary | ICD-10-CM | POA: Diagnosis not present

## 2015-10-13 MED ORDER — HYDROCODONE-ACETAMINOPHEN 5-325 MG PO TABS: 1.0000 | ORAL_TABLET | Freq: Four times a day (QID) | ORAL | Status: DC | PRN

## 2015-10-13 MED ORDER — ALPRAZOLAM 1 MG PO TABS: ORAL_TABLET | ORAL | Status: DC

## 2015-10-13 NOTE — Progress Notes (Signed)
   Subjective:    Patient ID: [name], male    DOB: 19-Apr-1951, 64 y.o.   MRN: [id number]  HPIpt arrives with sister [name] and [place] caretaker.  lright leg amputation. Would like rx for prosthesis.   Cramps in left leg. Started about 10 days ago.  He states he's been eating and drinking okay does smoke a few cigarettes a day but denies any chest tightness denies any hemoptysis denies any swelling in the leg.  Flu vaccine.   patient is doing much better at the place he is staying versus staying by himself in the apartment. His mental health is doing better as well   Review of Systems     He denies any headaches or chest pain denies shortness of breath or abdominal pain nausea vomiting or fever. States his appetite overall doing well. He does relate some intermittent right leg pain where he had his amputation. He is also stating having some intermittent muscle cramps in his left leg. Objective:   Physical Exam  lungs clear heart trigger pulse normal abdomen is soft the right leg amputation noted with prosthesis left leg no swelling noted no tenderness. Pulses are good       Assessment & Plan:   I do recommend stretching exercises for the left leg to avoid cramps   patient was advised to stay away from smoking Hydrocodone when necessary for right leg amputation pain  Not for frequent use  patient overall doing much better follow-up again in 4 months  patient with history hyperlipidemia check lipid profile  PSA screening  metabolic 7 ordered

## 2015-10-19 ENCOUNTER — Telehealth (HOSPITAL_COMMUNITY): Payer: Self-pay | Admitting: *Deleted

## 2015-10-19 ENCOUNTER — Ambulatory Visit (HOSPITAL_COMMUNITY): Payer: Self-pay | Admitting: Psychiatry

## 2015-10-19 NOTE — Telephone Encounter (Signed)
patient comes in today for a new patient, appointment but he is not on the schedule.   Looking to investigate the situation, we found that on 10/18/15 at 12:00 a.m. the appointment was Canceled via automated system.    We apologized to patient, explained explained when the appointment was canceled, and offered to reschedule, but he said he did not want to reschedule.    He said he was not leaving.

## 2015-10-31 ENCOUNTER — Other Ambulatory Visit: Payer: Self-pay | Admitting: *Deleted

## 2015-10-31 ENCOUNTER — Telehealth: Payer: Self-pay | Admitting: Family Medicine

## 2015-10-31 MED ORDER — HYDROCODONE-ACETAMINOPHEN 5-325 MG PO TABS: 1.0000 | ORAL_TABLET | Freq: Four times a day (QID) | ORAL | Status: DC | PRN

## 2015-10-31 NOTE — Telephone Encounter (Signed)
Seen 11/10 and got script for #35

## 2015-10-31 NOTE — Telephone Encounter (Signed)
[name] was notified script ready for pickup

## 2015-10-31 NOTE — Telephone Encounter (Signed)
May have refill on medication #35

## 2015-10-31 NOTE — Telephone Encounter (Signed)
HYDROcodone-acetaminophen (NORCO/VICODIN) 5-325 MG tablet  Pt needs a new refill on this med please, they will come pick it up when we call   Please call [name] with [facility] [phone] To take to [facility] to fill

## 2015-11-04 ENCOUNTER — Other Ambulatory Visit: Payer: Self-pay | Admitting: *Deleted

## 2015-11-04 ENCOUNTER — Telehealth: Payer: Self-pay | Admitting: Family Medicine

## 2015-11-04 MED ORDER — TEMAZEPAM 30 MG PO CAPS: ORAL_CAPSULE | ORAL | Status: DC

## 2015-11-04 NOTE — Telephone Encounter (Signed)
Med faxed to pharm. Pt notified.

## 2015-11-04 NOTE — Telephone Encounter (Signed)
Patient needs Rx for temazepam (RESTORIL) 30 MG capsule sent to [facility].  He switched pharmacies so he doesn't have any standing refills.

## 2015-11-04 NOTE — Telephone Encounter (Signed)
May have this and 4 refills

## 2015-11-04 NOTE — Telephone Encounter (Signed)
Last seen 10/13/15

## 2015-11-18 ENCOUNTER — Telehealth: Payer: Self-pay | Admitting: Family Medicine

## 2015-11-18 NOTE — Telephone Encounter (Signed)
Patient wanted you to know that his appt with the prostatic doctor was changed to 12/29.

## 2015-11-23 ENCOUNTER — Telehealth: Payer: Self-pay | Admitting: Family Medicine

## 2015-11-23 MED ORDER — HYDROCODONE-ACETAMINOPHEN 5-325 MG PO TABS: 1.0000 | ORAL_TABLET | Freq: Two times a day (BID) | ORAL | Status: DC | PRN

## 2015-11-23 NOTE — Telephone Encounter (Signed)
Pts sister, [name] on [place]  Would like a better understanding of what he is supposed be taking  In regards to his pain meds. She states he told her he has not had  Any meds in the last week.   He is out an she is unsure if he is supposed to be taking it, or is it  As needed and if he needs this he will need a script please

## 2015-11-23 NOTE — Telephone Encounter (Signed)
This is a difficult issue. I do not 1 this patient on multiple pain pills per day. Maximum should be no more than 2 per day.( I believe the patient due to his own psychologic approach toward this would take the medicine for her 5 times a day if he was allow.) I would recommend pain medication may be one twice a day when necessary, may give a prescription for 60 tablets no greater than 2 per day. It would be a good idea for the patient and family to follow-up somewhere within the next month

## 2015-11-23 NOTE — Telephone Encounter (Signed)
Rx was filled 10/31/15 and 10/13/15  per pharmacy for # 35 each time  and [facility] to [facility]- [facility] states patient is out currently and asks for the meds 3 times a day until they are gone

## 2015-11-23 NOTE — Telephone Encounter (Signed)
Discussed with sister [name] who is on patient's HIPPA- Sister advised This is a difficult issue. Dr Nicki Reaper does not want this patient on multiple pain pills per day. Maximum should be no more than 2 per day. Dr Nicki Reaper would recommend pain medication may be one twice a day when necessary, may give a prescription for 60 tablets no greater than 2 per day. It would be a good idea for the patient and family to follow-up somewhere within the next month. Rx printed and up front for pick up. Sister verbalized understanding and will pick up Rx 11/24/15.

## 2015-12-07 ENCOUNTER — Other Ambulatory Visit: Payer: Self-pay | Admitting: Family Medicine

## 2015-12-07 MED ORDER — TEMAZEPAM 30 MG PO CAPS: ORAL_CAPSULE | ORAL | Status: DC

## 2015-12-07 NOTE — Telephone Encounter (Signed)
May have this and 4 refills

## 2015-12-07 NOTE — Telephone Encounter (Signed)
Patient needs a written prescription refill on Restoril 30 mg.

## 2015-12-07 NOTE — Telephone Encounter (Signed)
Patient notified that script will be faxed to pharmacy.

## 2015-12-14 ENCOUNTER — Telehealth: Payer: Self-pay | Admitting: Family Medicine

## 2015-12-14 MED ORDER — HYDROCODONE-ACETAMINOPHEN 5-325 MG PO TABS: 1.0000 | ORAL_TABLET | Freq: Two times a day (BID) | ORAL | Status: DC | PRN

## 2015-12-14 NOTE — Telephone Encounter (Signed)
Pt is requesting a refill on his hydrocodone. Pt has two pills left.

## 2015-12-14 NOTE — Telephone Encounter (Signed)
Called patient and informed him per Dr.Scott Luking-Hydrocodone 5 mg/325 mg, 1 twice a day when necessary, may fill 12/15/2015, important for the patient to schedule follow-up office visit to follow-up by the end of the month, I encouraged his sister to come with him. Patient verbalized understanding.

## 2015-12-14 NOTE — Telephone Encounter (Signed)
Hydrocodone 5 mg/325 mg, 1 twice a day when necessary, may fill 12/15/2015, important for the patient to schedule follow-up office visit to follow-up by the end of the month, I encouraged his sister to come with him.

## 2015-12-15 ENCOUNTER — Ambulatory Visit (INDEPENDENT_AMBULATORY_CARE_PROVIDER_SITE_OTHER): Payer: Medicare Other | Admitting: Family Medicine

## 2015-12-15 ENCOUNTER — Ambulatory Visit (HOSPITAL_COMMUNITY)
Admission: RE | Admit: 2015-12-15 | Discharge: 2015-12-15 | Disposition: A | Discharge status: Home / Self Care | Payer: Medicare Other | Source: Ambulatory Visit | Attending: Family Medicine | Admitting: Family Medicine

## 2015-12-15 ENCOUNTER — Encounter: Payer: Self-pay | Admitting: Family Medicine

## 2015-12-15 VITALS — BP 112/80 | Temp 98.3°F | Ht 72.0 in | Wt 153.1 lb

## 2015-12-15 DIAGNOSIS — G8929 Other chronic pain: Secondary | ICD-10-CM | POA: Diagnosis not present

## 2015-12-15 DIAGNOSIS — M79602 Pain in left arm: Secondary | ICD-10-CM

## 2015-12-15 DIAGNOSIS — B9689 Other specified bacterial agents as the cause of diseases classified elsewhere: Secondary | ICD-10-CM

## 2015-12-15 DIAGNOSIS — M79606 Pain in leg, unspecified: Secondary | ICD-10-CM

## 2015-12-15 DIAGNOSIS — J019 Acute sinusitis, unspecified: Secondary | ICD-10-CM

## 2015-12-15 DIAGNOSIS — M79605 Pain in left leg: Secondary | ICD-10-CM

## 2015-12-15 DIAGNOSIS — R05 Cough: Secondary | ICD-10-CM

## 2015-12-15 DIAGNOSIS — Z89512 Acquired absence of left leg below knee: Secondary | ICD-10-CM

## 2015-12-15 DIAGNOSIS — R059 Cough, unspecified: Secondary | ICD-10-CM

## 2015-12-15 MED ORDER — AMOXICILLIN-POT CLAVULANATE 875-125 MG PO TABS: 1.0000 | ORAL_TABLET | Freq: Two times a day (BID) | ORAL | Status: DC

## 2015-12-15 MED ORDER — CEFTRIAXONE SODIUM 1 G IJ SOLR
500.0000 mg | Freq: Once | INTRAMUSCULAR | Status: AC
  Administered 2015-12-15: 500 mg via INTRAMUSCULAR

## 2015-12-15 NOTE — Progress Notes (Signed)
   Subjective:    Patient ID: [name], male    DOB: 01/07/1951, 65 y.o.   MRN: [id number]  Sinusitis This is a new problem. The current episode started 1 to 4 weeks ago. The problem is unchanged. There has been no fever. Associated symptoms include chills, congestion, coughing and swollen glands. (Fatigue) Past treatments include acetaminophen (robitussin). The treatment provided no relief.   Patient states that he has no other concerns at this time.  Patient recently has been taking his pain medicine more often than 2 per day  Review of Systems  Constitutional: Positive for chills.  HENT: Positive for congestion.   Respiratory: Positive for cough.        Objective:   Physical Exam Congested cough noted no crackles not respiratory distress had normal neck supple subjective discomfort in his amputation patient denies any redness       Assessment & Plan:  Sinusitis antibiotics prescribed warning signs discussed Bronchitisconcerning for the possibility of pneumonia check checks spray Patient has been encouraged to keep his follow-up visit in the spring Chronic pain discomfort with his leg I have informed the patient to avoid taking more than 2 pain medicines per day. He is already on nerve pills and he takes sleeping medicine is not he should take his pain medicine in the morning and again around 2 PM

## 2015-12-26 ENCOUNTER — Telehealth: Payer: Self-pay | Admitting: Family Medicine

## 2015-12-26 MED ORDER — ALPRAZOLAM 1 MG PO TABS: ORAL_TABLET | ORAL | Status: DC

## 2015-12-26 NOTE — Telephone Encounter (Signed)
May have refill +5 additional refills

## 2015-12-26 NOTE — Telephone Encounter (Signed)
Called patient and informed him per Dr.Scott Luking- refills on Xanax will be sent over to [facility]. Patient verbalized understanding.

## 2015-12-26 NOTE — Telephone Encounter (Signed)
Pt is needing a refill on his xanax.     [facility] [place] [place]

## 2016-01-03 ENCOUNTER — Telehealth: Payer: Self-pay | Admitting: Family Medicine

## 2016-01-03 MED ORDER — HYDROCODONE-ACETAMINOPHEN 5-325 MG PO TABS: 1.0000 | ORAL_TABLET | Freq: Two times a day (BID) | ORAL | Status: DC | PRN

## 2016-01-03 NOTE — Telephone Encounter (Signed)
Patient notified that script is ready for pick up.

## 2016-01-03 NOTE — Telephone Encounter (Signed)
Pt is requesting a refill on his hydrocodone.

## 2016-01-03 NOTE — Telephone Encounter (Signed)
Patient may have refill hydrocodone. One tablet twice a day when necessary, #60.

## 2016-01-11 ENCOUNTER — Telehealth: Payer: Self-pay | Admitting: Family Medicine

## 2016-01-11 NOTE — Telephone Encounter (Signed)
[facility] home dropped off forms to be filled out. Forms are in yellow folder in your office.

## 2016-01-12 NOTE — Telephone Encounter (Signed)
This all was filled out thank you

## 2016-01-16 ENCOUNTER — Telehealth: Payer: Self-pay | Admitting: Family Medicine

## 2016-01-16 MED ORDER — ALPRAZOLAM 1 MG PO TABS: ORAL_TABLET | ORAL | Status: DC

## 2016-01-16 NOTE — Telephone Encounter (Signed)
Spoke with patient and informed him per Dr.Scott Luking- 4 refills of Xanax will be sent over to [facility]. Patient verbalized understanding.

## 2016-01-16 NOTE — Telephone Encounter (Signed)
Refill on his xanax please and send to [facility]

## 2016-01-16 NOTE — Telephone Encounter (Signed)
Refill this and 4 additional

## 2016-02-03 ENCOUNTER — Telehealth: Payer: Self-pay | Admitting: Family Medicine

## 2016-02-03 NOTE — Telephone Encounter (Signed)
Patient notified that pharmacy states they have two additional refills available for him for this medication. Patient verbalized understanding.

## 2016-02-03 NOTE — Telephone Encounter (Signed)
temazepam (RESTORIL) 30 MG capsule  [facility]

## 2016-02-10 ENCOUNTER — Encounter: Payer: Self-pay | Admitting: Family Medicine

## 2016-02-10 ENCOUNTER — Ambulatory Visit (INDEPENDENT_AMBULATORY_CARE_PROVIDER_SITE_OTHER): Payer: Medicare Other | Admitting: Family Medicine

## 2016-02-10 VITALS — BP 112/80 | Ht 72.0 in | Wt 153.0 lb

## 2016-02-10 DIAGNOSIS — E785 Hyperlipidemia, unspecified: Secondary | ICD-10-CM | POA: Diagnosis not present

## 2016-02-10 DIAGNOSIS — M79605 Pain in left leg: Secondary | ICD-10-CM

## 2016-02-10 DIAGNOSIS — G8929 Other chronic pain: Secondary | ICD-10-CM | POA: Diagnosis not present

## 2016-02-10 DIAGNOSIS — M79602 Pain in left arm: Secondary | ICD-10-CM

## 2016-02-10 DIAGNOSIS — F411 Generalized anxiety disorder: Secondary | ICD-10-CM

## 2016-02-10 DIAGNOSIS — M549 Dorsalgia, unspecified: Secondary | ICD-10-CM | POA: Diagnosis not present

## 2016-02-10 MED ORDER — HYDROCODONE-ACETAMINOPHEN 5-325 MG PO TABS: 1.0000 | ORAL_TABLET | Freq: Two times a day (BID) | ORAL | Status: DC | PRN

## 2016-02-10 NOTE — Progress Notes (Signed)
   Subjective:    Patient ID: [name], male    DOB: August 29, 1951, 65 y.o.   MRN: [id number]  Hyperlipidemia This is a chronic problem. The current episode started more than 1 year ago. Current antihyperlipidemic treatment includes diet change. The current treatment provides moderate improvement of lipids. There are no compliance problems.  There are no known risk factors for coronary artery disease.   Patient has no concerns at this time.  Patient does have chronic pain in the left stump pain and discomfort with movement. In addition to this Vicodin does seem to help. Patient also relates a lot of restlessness and nervousness but the Xanax helps he states his sleep medicine does help him sleep at night.  Review of Systems Denies any chest tightness pressure pain shortness breath nausea vomiting diarrhea    Objective:   Physical Exam Lungs clear hearts regular has amputation of the left leg no sores HEENT is benign  25 minutes was spent with the patient. Greater than half the time was spent in discussion and answering questions and counseling regarding the issues that the patient came in for today.      Assessment & Plan:  Generalized anxiety uses Xanax during the day and one in the evening this helps keeps it under control Chronic pain with exam dictation does not take more than 2 Vicodin's per day does not cause drowsiness does not abuse his medicine 3 prescriptions were given follow-up in 3 months  Patient does need a screening lab work the lab work was given to him he was encouraged to get it done Patient encouraged quit smoking Patient takes his medication for insomnia does well with help him rest Pay states a reflux medication doing well continue this for now.

## 2016-02-13 DIAGNOSIS — Z125 Encounter for screening for malignant neoplasm of prostate: Secondary | ICD-10-CM | POA: Diagnosis not present

## 2016-02-13 DIAGNOSIS — E785 Hyperlipidemia, unspecified: Secondary | ICD-10-CM | POA: Diagnosis not present

## 2016-02-13 DIAGNOSIS — Z79899 Other long term (current) drug therapy: Secondary | ICD-10-CM | POA: Diagnosis not present

## 2016-02-14 ENCOUNTER — Encounter: Payer: Self-pay | Admitting: Family Medicine

## 2016-02-14 LAB — BASIC METABOLIC PANEL
BUN/Creatinine Ratio: 15 (ref 10–22)
BUN: 13 mg/dL (ref 8–27)
CO2: 27 mmol/L (ref 18–29)
Calcium: 9.7 mg/dL (ref 8.6–10.2)
Chloride: 93 mmol/L — ABNORMAL LOW (ref 96–106)
Creatinine, Ser: 0.84 mg/dL (ref 0.76–1.27)
GFR calc Af Amer: 108 mL/min/{1.73_m2} (ref >59)
GFR calc non Af Amer: 94 mL/min/{1.73_m2} (ref >59)
GLUCOSE: 138 mg/dL — AB (ref 65–99)
POTASSIUM: 4.8 mmol/L (ref 3.5–5.2)
SODIUM: 137 mmol/L (ref 134–144)

## 2016-02-14 LAB — LIPID PANEL
Chol/HDL Ratio: 4.1 ratio units (ref 0.0–5.0)
Cholesterol, Total: 177 mg/dL (ref 100–199)
HDL: 43 mg/dL (ref >39)
LDL CALC: 94 mg/dL (ref 0–99)
Triglycerides: 200 mg/dL — ABNORMAL HIGH (ref 0–149)
VLDL Cholesterol Cal: 40 mg/dL (ref 5–40)

## 2016-02-14 LAB — PSA: PROSTATE SPECIFIC AG, SERUM: 0.9 ng/mL (ref 0.0–4.0)

## 2016-03-05 ENCOUNTER — Telehealth: Payer: Self-pay | Admitting: Family Medicine

## 2016-03-05 NOTE — Telephone Encounter (Signed)
The facility the pt is in dropped off a form to be filled out. Form is in yellow folder at nurse station.

## 2016-03-06 NOTE — Telephone Encounter (Signed)
Form placed in forms folder in Dr.Scott's office

## 2016-03-06 NOTE — Telephone Encounter (Signed)
I have a very hard time believing that this patient needs a Armed forces technical officer. This patient is already in a assisted living facility/rest home. To my knowledge he is getting his food prepped for him. I am mention he is also getting his clothes washed. Typically this services for people who have suffered a stroke or have severe dementia and are living on their own or living with family. Please discuss with the facility the reasons why they feel that an assistanct is needed. This seems like overkill

## 2016-03-07 NOTE — Telephone Encounter (Signed)
[facility] 03/07/16

## 2016-03-07 NOTE — Telephone Encounter (Signed)
Spoke with Administrative person over Group home in which the patient resides in and was told that the form was not for a Psychologist, sport and exercise but it is for just for assessment purposes. Please advise?

## 2016-03-08 ENCOUNTER — Telehealth: Payer: Self-pay | Admitting: Family Medicine

## 2016-03-08 NOTE — Telephone Encounter (Signed)
Form was completed please forward appropriately

## 2016-03-08 NOTE — Telephone Encounter (Signed)
Pt's social worker dropped off a form to be filled out. Form is in yellow folder in Dr's office.

## 2016-03-11 ENCOUNTER — Telehealth: Payer: Self-pay | Admitting: Family Medicine

## 2016-03-11 NOTE — Telephone Encounter (Signed)
Nurse-this patient had a form dropped off by [employer]. I believe that is his sister. In regards to whether or not patient is capable of managing his funds. Please go fishing with the contact person regarding the reason they are getting this. In my opinion the patient would not be capable of handling his funds but I am hesitant to fill that out without getting further information from the sister on the reason for this form and what they are hoping for.

## 2016-03-12 NOTE — Telephone Encounter (Signed)
[name] is supervisor at assisted living home where patient is a resident. They have new owner and need all the forms refilled out for social services  They need the form to say the patient is in his right mind and able to manage his own financial affairs. Patient is currently handling  his own financial affairs and paying his own bills.

## 2016-03-13 ENCOUNTER — Telehealth: Payer: Self-pay | Admitting: Family Medicine

## 2016-03-13 NOTE — Telephone Encounter (Signed)
He may have a prescription for the sleeping medicine 1 daily at bedtime #30 with 5 refills, may also have prescription for hydrocodone 5 mg/325 mg #60 one twice a day taken 1 at 8 AM 1 2 PM. It would be fine to give 2 prescriptions of hydrocodone

## 2016-03-13 NOTE — Telephone Encounter (Signed)
1) temazepam (RESTORIL) 30 MG capsule   Pts facility has changed ownership, currently it  Is needing a new script for this med bc it did not  come across the [facility]   Please send to [facility] please   2) HYDROcodone-acetaminophen (NORCO/VICODIN) 5-325 MG tablet  Under the new mgmt he is not allowed to take the this med daily at 8 and 2  Because it is a PRN med. He can have it twice a day but as needed according to  Their rules unless you write a note stating they can give it to him a the specific time Of 8 am and 2 pm.  Please advise, and fax that script/note to [facility] as well

## 2016-03-14 MED ORDER — HYDROCODONE-ACETAMINOPHEN 5-325 MG PO TABS: ORAL_TABLET | ORAL | Status: DC

## 2016-03-14 MED ORDER — TEMAZEPAM 30 MG PO CAPS: ORAL_CAPSULE | ORAL | Status: DC

## 2016-03-14 NOTE — Telephone Encounter (Signed)
Prescriptions up front for patient pick up. [name] from assisted living notified and stated she will be by to pick up rxs.

## 2016-03-15 NOTE — Telephone Encounter (Signed)
Error

## 2016-03-15 NOTE — Telephone Encounter (Signed)
Facility dropped off another paper to be signed verifying the pt's medication list.

## 2016-03-15 NOTE — Telephone Encounter (Signed)
There are two forms waiting to be filled out and signed in the yellow folder in Dr. Bary Leriche office. Facility needed these forms back by in the morning but was advised that the Dr. Is not here and won't be back until Monday. Facility will need these first thing Monday morning if possible.

## 2016-03-15 NOTE — Telephone Encounter (Signed)
Form in yellow folder in dr's office.

## 2016-03-18 NOTE — Telephone Encounter (Signed)
Nurse's-please talk with the sister of [name] and make sure that all is in fact managing his own funds? Because that is what one of these forms is asking. Thank you

## 2016-03-19 NOTE — Telephone Encounter (Signed)
Forms ready for pickup. [name] notified

## 2016-03-19 NOTE — Telephone Encounter (Signed)
I talked with pt's sister and she states his check goes directly to group home and he is given $60 a month out of his check.

## 2016-03-26 ENCOUNTER — Telehealth: Payer: Self-pay | Admitting: Family Medicine

## 2016-03-26 NOTE — Telephone Encounter (Signed)
I absolutely do not understand why they need me to fill out additional information. Please clarify why they are needing this. It was my understanding that this patient overall was getting help from the assisted living he is staying at. I don't understand why this request for independent assessment for personal care services I need for them to clarify more than just they need the form filled out-potentially the facility needs to come for an office visit with the patient to discuss it directly unfortunately I do not have open slots for the next 10 days

## 2016-03-26 NOTE — Telephone Encounter (Signed)
Facility dropped off a form that was partially filled out. Facility is needing the form completed asap. Form in yellow folder in Dr's office.

## 2016-03-27 NOTE — Telephone Encounter (Signed)
Patient was transferred to front desk to schedule appointment.

## 2016-04-03 ENCOUNTER — Ambulatory Visit (INDEPENDENT_AMBULATORY_CARE_PROVIDER_SITE_OTHER): Payer: Medicare Other | Admitting: Family Medicine

## 2016-04-03 ENCOUNTER — Encounter: Payer: Self-pay | Admitting: Family Medicine

## 2016-04-03 VITALS — BP 118/80 | Ht 72.0 in | Wt 156.0 lb

## 2016-04-03 DIAGNOSIS — G8929 Other chronic pain: Secondary | ICD-10-CM

## 2016-04-03 DIAGNOSIS — Z111 Encounter for screening for respiratory tuberculosis: Secondary | ICD-10-CM

## 2016-04-03 DIAGNOSIS — F411 Generalized anxiety disorder: Secondary | ICD-10-CM

## 2016-04-03 DIAGNOSIS — M549 Dorsalgia, unspecified: Secondary | ICD-10-CM | POA: Diagnosis not present

## 2016-04-03 MED ORDER — TRIAMCINOLONE ACETONIDE 0.1 % EX CREA: 1.0000 "application " | TOPICAL_CREAM | Freq: Two times a day (BID) | CUTANEOUS | Status: DC | PRN

## 2016-04-03 NOTE — Progress Notes (Signed)
   Subjective:    Patient ID: [name], male    DOB: 10-05-1951, 65 y.o.   MRN: [id number]  HPI Patient is here today because he needs a request for independent assessment for personal care services form completed.  Patient states that he has no concerns at this time.  Pain medication does well for his back he does not overuse it uses no more than 2 per day The facility he is at helps him greatly by prepping his foods giving him his medications. Because her chronic anxiety depression and mental health issues he was not responsible taking care of himself and was in disarray now he is doing much better.  Review of Systems 15 minutes spent with the patient on this particular issue discussing his overall health care and facility care along with the need for personal care assistance    Objective:   Physical Exam Lungs clear heart regular amputation noted extremity no edema neurologic grossly normal       Assessment & Plan:  Chronic pain-continue current medication Chronic anxiety along with history of depression in remission currently continue current medications uses Xanax as necessary to help with anxiety issues Chronic insomnia uses medication to help with this this is been a long-term medication The patient is in need of ongoing health and assistance from caretakers for food preparation and personal care.

## 2016-04-04 ENCOUNTER — Telehealth: Payer: Self-pay | Admitting: Family Medicine

## 2016-04-04 NOTE — Telephone Encounter (Signed)
The form regarding personal care services was completed. Please send this form to his assisted living home.

## 2016-04-05 LAB — TB SKIN TEST
INDURATION: 0 mm
TB SKIN TEST: NEGATIVE

## 2016-04-19 ENCOUNTER — Ambulatory Visit: Payer: Medicare Other | Admitting: Family Medicine

## 2016-05-07 ENCOUNTER — Telehealth: Payer: Self-pay | Admitting: Family Medicine

## 2016-05-07 MED ORDER — HYDROCODONE-ACETAMINOPHEN 5-325 MG PO TABS: ORAL_TABLET | ORAL | Status: DC

## 2016-05-07 NOTE — Telephone Encounter (Signed)
Rx printed and up front for pick up. Pharmacy notified and stated the will pick up today.

## 2016-05-07 NOTE — Telephone Encounter (Signed)
Pt is needing a hard script for his upcoming refill on his hydrocodone. [facility] had an old hard copy that stated the medication was prn, but has since been changed to twice a day at 8am and 2pm.

## 2016-05-07 NOTE — Telephone Encounter (Signed)
Prescription was written for 60 tablets 1 8 AM 1 PM please put this in Epic as well thank you

## 2016-05-11 ENCOUNTER — Encounter: Payer: Self-pay | Admitting: Family Medicine

## 2016-05-11 ENCOUNTER — Ambulatory Visit (INDEPENDENT_AMBULATORY_CARE_PROVIDER_SITE_OTHER): Payer: Medicare Other | Admitting: Family Medicine

## 2016-05-11 VITALS — BP 106/70 | Ht 72.0 in | Wt 156.5 lb

## 2016-05-11 DIAGNOSIS — G8929 Other chronic pain: Secondary | ICD-10-CM | POA: Diagnosis not present

## 2016-05-11 DIAGNOSIS — M549 Dorsalgia, unspecified: Secondary | ICD-10-CM

## 2016-05-11 DIAGNOSIS — R739 Hyperglycemia, unspecified: Secondary | ICD-10-CM | POA: Diagnosis not present

## 2016-05-11 DIAGNOSIS — F411 Generalized anxiety disorder: Secondary | ICD-10-CM | POA: Diagnosis not present

## 2016-05-11 LAB — POCT GLYCOSYLATED HEMOGLOBIN (HGB A1C): HEMOGLOBIN A1C: 4.9

## 2016-05-11 MED ORDER — HYDROCODONE-ACETAMINOPHEN 5-325 MG PO TABS: ORAL_TABLET | ORAL | Status: DC

## 2016-05-11 NOTE — Progress Notes (Signed)
   Subjective:    Patient ID: [name], male    DOB: [birth year], 65 y.o.   MRN: [id number]  Hyperlipidemia This is a chronic problem. The current episode started more than 1 year ago. Pertinent negatives include no chest pain. There are no compliance problems.   We discussed in detail his chronic pain with his back as well as his amputation overall he is doing well he stable. He is due for his prescriptions.  Patient has c/o of dizziness. Intermittent dizziness comes and goes with certain position changes  Review of Systems  Constitutional: Negative for activity change, appetite change and fatigue.  HENT: Negative for congestion.   Respiratory: Negative for cough.   Cardiovascular: Negative for chest pain.  Gastrointestinal: Negative for abdominal pain.  Endocrine: Negative for polydipsia and polyphagia.  Neurological: Negative for weakness.  Psychiatric/Behavioral: Negative for confusion.       Objective:   Physical Exam  Constitutional: He appears well-nourished. No distress.  Cardiovascular: Normal rate, regular rhythm and normal heart sounds.   No murmur heard. Pulmonary/Chest: Effort normal and breath sounds normal. No respiratory distress.  Musculoskeletal: He exhibits no edema.  Lymphadenopathy:    He has no cervical adenopathy.  Neurological: He is alert.  Psychiatric: His behavior is normal.  Vitals reviewed.  Patient counseled to quit smoking   Hemoglobin A1c great no sign of diabetes    Assessment & Plan:  Patient overall doing fairly well Pain medication as prescribed 3 scripts given. Patient is at a rest home which controls his medication I do not feel the patient is at any significant risk of abuse of the medicine at this point

## 2016-06-01 ENCOUNTER — Telehealth: Payer: Self-pay | Admitting: Family Medicine

## 2016-06-01 MED ORDER — TEMAZEPAM 30 MG PO CAPS: ORAL_CAPSULE | ORAL | Status: DC

## 2016-06-01 MED ORDER — TRIAMCINOLONE ACETONIDE 0.1 % EX CREA: 1.0000 "application " | TOPICAL_CREAM | Freq: Three times a day (TID) | CUTANEOUS | Status: DC

## 2016-06-01 NOTE — Telephone Encounter (Signed)
Medications changed in epic and sent to patient's pharmacy. Orders for medication changes to be sent to [facility] via Mail.

## 2016-06-01 NOTE — Telephone Encounter (Signed)
I have reviewed over the request it is fine for the patient medication to reflect temazepam daily at bedtime at 8 PM for sleep, triamcinolone 3 times a day patient may self administer and the other request that the facility please complete these in the Epic as well as [place] on prescription and I will sign it then we can send

## 2016-06-01 NOTE — Telephone Encounter (Signed)
The facility where the pt is staying dropped off a list of medication changes that are needed to be made to the patients MAR. List is in Dr's folder.

## 2016-06-06 ENCOUNTER — Encounter: Payer: Self-pay | Admitting: Family Medicine

## 2016-06-06 ENCOUNTER — Ambulatory Visit (INDEPENDENT_AMBULATORY_CARE_PROVIDER_SITE_OTHER): Payer: Medicare Other | Admitting: Family Medicine

## 2016-06-06 VITALS — BP 118/72 | Ht 72.0 in | Wt 159.1 lb

## 2016-06-06 DIAGNOSIS — L989 Disorder of the skin and subcutaneous tissue, unspecified: Secondary | ICD-10-CM | POA: Diagnosis not present

## 2016-06-06 DIAGNOSIS — M25561 Pain in right knee: Secondary | ICD-10-CM | POA: Diagnosis not present

## 2016-06-06 MED ORDER — HYDROCODONE-ACETAMINOPHEN 5-325 MG PO TABS: ORAL_TABLET | ORAL | Status: DC

## 2016-06-06 MED ORDER — CIPROFLOXACIN HCL 500 MG PO TABS: ORAL_TABLET | ORAL | Status: DC

## 2016-06-06 MED ORDER — MUPIROCIN 2 % EX OINT: TOPICAL_OINTMENT | CUTANEOUS | Status: DC

## 2016-06-06 NOTE — Patient Instructions (Signed)
Patient was seen today as part of evaluation of the skin sore on the right lower leg. X-ray was ordered Patient will be on Cipro 500 mg 1 twice a day for 7 days Bactroban ointment apply twice daily until healed We will be setting him up with orthopedics for follow-up opinion as well as setting him up with prosthetics for adjustment of his prosthesis Pain medication may be increase May take 1 at 8 AM, 1 at 2 PM, 1 at 8 PM Follow-up here in one month

## 2016-06-06 NOTE — Progress Notes (Signed)
   Subjective:    Patient ID: [name], male    DOB: 1951/03/09, 65 y.o.   MRN: [id number]  Knee Pain  The incident occurred more than 1 week ago. The symptoms are aggravated by movement and weight bearing. Treatments tried: Antibacterial strips.   Patient states no other concerns this visit. Relates a pressure sore denies fever chills sweats  Review of Systems Patient with knee pain discomfort where the pressure sore is    Objective:   Physical Exam  Amputation of the right lower leg noted. Has a pressure sore. No significant cellulitis. Patient does not have much fat on this.      Assessment & Plan:  Complex issues There is a sore on the outer part of his right lower leg. This need to x-ray Bactroban ointment applied twice a day until healed Cipro 500 mg twice a day for 7 days Increased pain medication 3 times daily taking 1 at 8 AM, 2 PM, 8 PM Recheck patient in one month Referral to orthopedics   We will send a copy of this evaluation to [facility] [phone]  I believe the patient would benefit from having his prosthesis adjusted

## 2016-06-08 ENCOUNTER — Ambulatory Visit (HOSPITAL_COMMUNITY)
Admission: RE | Admit: 2016-06-08 | Discharge: 2016-06-08 | Disposition: A | Discharge status: Home / Self Care | Payer: Medicare Other | Source: Ambulatory Visit | Attending: Family Medicine | Admitting: Family Medicine

## 2016-06-08 DIAGNOSIS — Z89511 Acquired absence of right leg below knee: Secondary | ICD-10-CM | POA: Diagnosis not present

## 2016-06-08 DIAGNOSIS — S88111A Complete traumatic amputation at level between knee and ankle, right lower leg, initial encounter: Secondary | ICD-10-CM | POA: Diagnosis not present

## 2016-06-08 DIAGNOSIS — M25561 Pain in right knee: Secondary | ICD-10-CM | POA: Diagnosis not present

## 2016-06-11 ENCOUNTER — Other Ambulatory Visit: Payer: Self-pay | Admitting: *Deleted

## 2016-06-11 ENCOUNTER — Telehealth: Payer: Self-pay | Admitting: Family Medicine

## 2016-06-11 MED ORDER — ALPRAZOLAM 1 MG PO TABS: ORAL_TABLET | ORAL | Status: DC

## 2016-06-11 NOTE — Telephone Encounter (Signed)
Xanax refill please  He runs out today, [facility]

## 2016-06-11 NOTE — Telephone Encounter (Signed)
Pt notified. Med sent to pharm.

## 2016-06-11 NOTE — Telephone Encounter (Signed)
This and 4 refills

## 2016-06-11 NOTE — Telephone Encounter (Signed)
Last seen 06/06/16

## 2016-06-12 ENCOUNTER — Encounter: Payer: Self-pay | Admitting: Family Medicine

## 2016-06-14 ENCOUNTER — Telehealth: Payer: Self-pay | Admitting: Family Medicine

## 2016-06-14 NOTE — Telephone Encounter (Signed)
Facility dropped off a couple forms to be filled out for the pt. Forms are in nurse box.

## 2016-06-14 NOTE — Telephone Encounter (Signed)
I will need the patient's paper chart please

## 2016-06-17 NOTE — Telephone Encounter (Signed)
The forms were filled out as requested and forwarded to nursing for ICD codes

## 2016-06-18 NOTE — Telephone Encounter (Signed)
Form done. Forwarded to front desk.

## 2016-07-09 DIAGNOSIS — L97811 Non-pressure chronic ulcer of other part of right lower leg limited to breakdown of skin: Secondary | ICD-10-CM | POA: Diagnosis not present

## 2016-07-11 ENCOUNTER — Ambulatory Visit (INDEPENDENT_AMBULATORY_CARE_PROVIDER_SITE_OTHER): Payer: Medicare Other | Admitting: Family Medicine

## 2016-07-11 ENCOUNTER — Encounter: Payer: Self-pay | Admitting: Family Medicine

## 2016-07-11 VITALS — BP 130/82 | Ht 72.0 in | Wt 158.4 lb

## 2016-07-11 DIAGNOSIS — L989 Disorder of the skin and subcutaneous tissue, unspecified: Secondary | ICD-10-CM

## 2016-07-11 NOTE — Progress Notes (Signed)
   Subjective:    Patient ID: [name], male    DOB: 16-Apr-1951, 65 y.o.   MRN: [id number]  HPI  Patient arrives for a follow up on sore on right knee. Patient states it is doing better and he saw ortho surgeon for it Monday and they are sending him for prosthesis adjustment.   Review of Systems    he denies any fever chills sweats he denies any drainage he states the skin area seems to be healing some does relate some soreness and pain Objective:   Physical Exam  On examination there does appear to be a little bit of the skin sore but it does seem to be healing compared where was her's no cellulitis the rest of the knee amputation looks good his lungs are clear hearts regular patient was counseled to quit smoking      Assessment & Plan:  Skin sore-gradually healing I would recommend using moleskin on it see the orthotic adjust her patient to follow-up here in one month for recheck of this plus also chronic pain visit  This patient has severe anxiety issues and Xanax is warranted. He has been cautioned that use of pain medicine as well as nerve medication can increase her risk of accidental overdose so far the patient is doing well with the medicine without any side effects

## 2016-07-16 ENCOUNTER — Telehealth: Payer: Self-pay | Admitting: Family Medicine

## 2016-07-16 NOTE — Telephone Encounter (Signed)
Patient seen at [facility] today.  [name] says that his leg is a mess, and the clinician specialist he is seeing today says that if Dr. Nicki Reaper will call him, then he will explain what he needs to do.  Brooke Pace, [place] [phone]

## 2016-08-07 ENCOUNTER — Other Ambulatory Visit: Payer: Self-pay | Admitting: *Deleted

## 2016-08-07 ENCOUNTER — Telehealth: Payer: Self-pay | Admitting: Family Medicine

## 2016-08-07 MED ORDER — HYDROCODONE-ACETAMINOPHEN 5-325 MG PO TABS: ORAL_TABLET | ORAL | 0 refills | Status: DC

## 2016-08-07 NOTE — Telephone Encounter (Signed)
Needs note to D/C ibuprofen, would like to pick this up today when she picks up Rx for pain medication.

## 2016-08-07 NOTE — Telephone Encounter (Signed)
Prescription up front for pickup. Assisted living facility to pick up prescription

## 2016-08-07 NOTE — Telephone Encounter (Signed)
Last seen for pain management in june

## 2016-08-07 NOTE — Telephone Encounter (Signed)
A simple no was written to discontinue fax it to them if they can't pick it up

## 2016-08-07 NOTE — Telephone Encounter (Signed)
Last filled 07/06/16

## 2016-08-07 NOTE — Telephone Encounter (Signed)
Patient has appointment 08/13/16 with Dr Nicki Reaper for pain management

## 2016-08-07 NOTE — Telephone Encounter (Signed)
Requesting Rx for HYDROcodone-acetaminophen (NORCO/VICODIN) 5-325 MG tablet  [facility]

## 2016-08-07 NOTE — Telephone Encounter (Signed)
May have a prescription for 1 month supply. You will need to connect with his pharmacy to find out when he got his last one so it can be properly dated. Finally patient needs follow-up office visit in September for pain management.

## 2016-08-13 ENCOUNTER — Ambulatory Visit (INDEPENDENT_AMBULATORY_CARE_PROVIDER_SITE_OTHER): Payer: Medicare Other | Admitting: Family Medicine

## 2016-08-13 ENCOUNTER — Encounter: Payer: Self-pay | Admitting: Family Medicine

## 2016-08-13 VITALS — BP 112/74 | Ht 72.0 in | Wt 162.0 lb

## 2016-08-13 DIAGNOSIS — Z79891 Long term (current) use of opiate analgesic: Secondary | ICD-10-CM

## 2016-08-13 DIAGNOSIS — Z23 Encounter for immunization: Secondary | ICD-10-CM

## 2016-08-13 MED ORDER — HYDROCODONE-ACETAMINOPHEN 5-325 MG PO TABS: ORAL_TABLET | ORAL | 0 refills | Status: DC

## 2016-08-13 NOTE — Progress Notes (Signed)
Subjective:     Patient ID: [name], male   DOB: 1951-10-05, 65 y.o.   MRN: [id number]  HPI   Review of Systems     Objective:   Physical Exam     Assessment:        Plan:

## 2016-08-13 NOTE — Telephone Encounter (Signed)
This person was spoken with. A prescription to order a new prosthesis was written along with a letter and today's office visit which was sent out by our medical records.

## 2016-08-13 NOTE — Progress Notes (Signed)
   Subjective:    Patient ID: [name], male    DOB: Jun 26, 1951, 65 y.o.   MRN: [id number]  HPI This patient was seen today for chronic pain. Takes for right leg pain.   The medication list was reviewed and updated.   -Compliance with medication: takes as prescribe  - Number patient states they take daily: three a day  -when was the last dose patient took? This am  The patient was advised the importance of maintaining medication and not using illegal substances with these.  Refills needed: yes  The patient was educated that we can provide 3 monthly scripts for their medication, it is their responsibility to follow the instructions.  Side effects or complications from medications: none  Patient is aware that pain medications are meant to minimize the severity of the pain to allow their pain levels to improve to allow for better function. They are aware of that pain medications cannot totally remove their pain.  Due for UDT ( at least once per year) : due today  Need rx sent to [facility] for leg prosthesis. Denies any injury has a lot of pain discomfort where the current prosthesis does not fit properly. I spoke with the specialists who indicated that the current prosthesis is not adequate for him in his breaking down. Pain and swelling in left middle finger.          Review of Systems  Constitutional: Negative for activity change.  Gastrointestinal: Negative for abdominal pain and vomiting.  Musculoskeletal: Positive for arthralgias.  Neurological: Negative for weakness.  Psychiatric/Behavioral: Negative for confusion.       Objective:   Physical Exam  Constitutional: He appears well-nourished.  Cardiovascular: Normal rate, regular rhythm and normal heart sounds.   No murmur heard. Pulmonary/Chest: Effort normal and breath sounds normal.  Musculoskeletal: He exhibits no edema.  Lymphadenopathy:    He has no cervical adenopathy.  Neurological: He is alert.    Psychiatric: His behavior is normal.  Vitals reviewed.   Slight stiffness middle joint PIP of the right middle finger.      Assessment & Plan:  Chronic pain-patient does not exceed 3 per day. 3 prescriptions were written for the patient. Patient was encouraged to follow-up in 3 months he does not abuse medicine. 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 This patient needs a new prosthesis. The present one is inadequate. In order for this patient to do his ADLs and to have his level of activity a new prosthesis is necessary and medically indicated and the patient benefits from this.  Arthralgia in the finger if this worsens may need to do testing but more than likely osteoarthritis

## 2016-08-19 LAB — TOXASSURE SELECT 13 (MW), URINE

## 2016-10-17 ENCOUNTER — Telehealth: Payer: Self-pay | Admitting: Family Medicine

## 2016-10-17 DIAGNOSIS — Z01818 Encounter for other preprocedural examination: Secondary | ICD-10-CM

## 2016-10-17 NOTE — Telephone Encounter (Signed)
Pt states that Juline Patch, care coordinator with [employer] faxed forms here for Dr. Nicki Reaper to complete and fax back on recommendation for patient to move out of group home to live on his own.  Pt states he's been living in the group home for over a year, is doing well, and needs doc to agree to patient being able to live on his own  Riki Sheer Ph# [phone], Fx# [phone]  Please advise

## 2016-10-17 NOTE — Telephone Encounter (Signed)
Brendale-Please let the patient know that this was a entry error. The system earlier this morning when I was entering in information regarding another patient flipped to his electronics. Please have the staff cancel the referral

## 2016-10-17 NOTE — Telephone Encounter (Signed)
Urgent referral for Cardiology ordered in epic for preoperative clearance per Dr.Scott Luking

## 2016-10-17 NOTE — Telephone Encounter (Signed)
This was a entry error by myself. Izora Ribas will be calling the patient to let him know that this was a entry error and has been corrected. Please cancel the consult

## 2016-10-18 NOTE — Telephone Encounter (Signed)
The last time this patient was on his own there were some significant issues with his care. I would recommend that the patient and his sister be scheduled for an office visit within the next couple weeks to discuss this face-to-face in regards to whether or not he should be out on his own or not.

## 2016-10-19 ENCOUNTER — Ambulatory Visit: Payer: Self-pay | Admitting: Cardiovascular Disease

## 2016-10-19 NOTE — Telephone Encounter (Signed)
Spoke with patient and informed him per Dr.Scott Luking- Dr.Scott recommends that you and your sister come in to discuss living situations face to face. Patient verbalized understanding and stated that he already has an appointment for 11/12/16 and he would like to discuss it at that visit.

## 2016-11-01 ENCOUNTER — Other Ambulatory Visit: Payer: Self-pay | Admitting: *Deleted

## 2016-11-01 MED ORDER — ALPRAZOLAM 1 MG PO TABS: ORAL_TABLET | ORAL | 4 refills | Status: DC

## 2016-11-01 MED ORDER — TEMAZEPAM 30 MG PO CAPS: ORAL_CAPSULE | ORAL | 4 refills | Status: DC

## 2016-11-01 NOTE — Telephone Encounter (Signed)
May have each refilled plus four additional refills

## 2016-11-12 ENCOUNTER — Ambulatory Visit (INDEPENDENT_AMBULATORY_CARE_PROVIDER_SITE_OTHER): Payer: Medicare Other | Admitting: Family Medicine

## 2016-11-12 VITALS — BP 102/78 | Ht 72.0 in | Wt 163.2 lb

## 2016-11-12 DIAGNOSIS — Z125 Encounter for screening for malignant neoplasm of prostate: Secondary | ICD-10-CM | POA: Diagnosis not present

## 2016-11-12 DIAGNOSIS — F411 Generalized anxiety disorder: Secondary | ICD-10-CM

## 2016-11-12 DIAGNOSIS — E784 Other hyperlipidemia: Secondary | ICD-10-CM

## 2016-11-12 DIAGNOSIS — M79604 Pain in right leg: Secondary | ICD-10-CM

## 2016-11-12 DIAGNOSIS — E7849 Other hyperlipidemia: Secondary | ICD-10-CM

## 2016-11-12 DIAGNOSIS — G4700 Insomnia, unspecified: Secondary | ICD-10-CM | POA: Diagnosis not present

## 2016-11-12 DIAGNOSIS — G8929 Other chronic pain: Secondary | ICD-10-CM | POA: Diagnosis not present

## 2016-11-12 MED ORDER — HYDROCODONE-ACETAMINOPHEN 5-325 MG PO TABS: ORAL_TABLET | ORAL | 0 refills | Status: DC
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
## 2016-11-14 ENCOUNTER — Ambulatory Visit: Payer: Self-pay | Admitting: Cardiovascular Disease

## 2016-12-13 ENCOUNTER — Telehealth: Payer: Self-pay | Admitting: Family Medicine

## 2016-12-13 NOTE — Telephone Encounter (Signed)
Need D/C order for mupirocin cream and please fax to [facility] [phone].  The facility is updating their books for the state and they need this ASAP.

## 2016-12-13 NOTE — Telephone Encounter (Signed)
DC order signed and faxed to [facility].

## 2016-12-13 NOTE — Telephone Encounter (Signed)
Please advise 

## 2016-12-13 NOTE — Telephone Encounter (Signed)
Please give a discontinuation order for this thank you

## 2017-01-11 ENCOUNTER — Other Ambulatory Visit: Payer: Self-pay | Admitting: Family Medicine

## 2017-01-31 DIAGNOSIS — Z125 Encounter for screening for malignant neoplasm of prostate: Secondary | ICD-10-CM | POA: Diagnosis not present

## 2017-01-31 DIAGNOSIS — E784 Other hyperlipidemia: Secondary | ICD-10-CM | POA: Diagnosis not present

## 2017-02-01 LAB — BASIC METABOLIC PANEL
BUN / CREAT RATIO: 17 (ref 10–24)
BUN: 16 mg/dL (ref 8–27)
CO2: 29 mmol/L (ref 18–29)
CREATININE: 0.92 mg/dL (ref 0.76–1.27)
Calcium: 9.7 mg/dL (ref 8.6–10.2)
Chloride: 96 mmol/L (ref 96–106)
GFR, EST AFRICAN AMERICAN: 102 mL/min/{1.73_m2} (ref >59)
GFR, EST NON AFRICAN AMERICAN: 88 mL/min/{1.73_m2} (ref >59)
Glucose: 112 mg/dL — ABNORMAL HIGH (ref 65–99)
POTASSIUM: 5.1 mmol/L (ref 3.5–5.2)
SODIUM: 140 mmol/L (ref 134–144)

## 2017-02-01 LAB — HEPATIC FUNCTION PANEL
ALBUMIN: 4.7 g/dL (ref 3.6–4.8)
ALT: 16 IU/L (ref 0–44)
AST: 21 IU/L (ref 0–40)
Alkaline Phosphatase: 68 IU/L (ref 39–117)
BILIRUBIN TOTAL: 0.4 mg/dL (ref 0.0–1.2)
BILIRUBIN, DIRECT: 0.11 mg/dL (ref 0.00–0.40)
TOTAL PROTEIN: 7.9 g/dL (ref 6.0–8.5)

## 2017-02-01 LAB — LIPID PANEL
CHOL/HDL RATIO: 4.1 ratio (ref 0.0–5.0)
Cholesterol, Total: 179 mg/dL (ref 100–199)
HDL: 44 mg/dL (ref >39)
LDL CALC: 106 mg/dL — AB (ref 0–99)
Triglycerides: 144 mg/dL (ref 0–149)
VLDL Cholesterol Cal: 29 mg/dL (ref 5–40)

## 2017-02-01 LAB — PSA: Prostate Specific Ag, Serum: 0.6 ng/mL (ref 0.0–4.0)

## 2017-02-11 ENCOUNTER — Ambulatory Visit: Payer: Medicare Other | Admitting: Family Medicine

## 2017-02-19 ENCOUNTER — Ambulatory Visit (INDEPENDENT_AMBULATORY_CARE_PROVIDER_SITE_OTHER): Payer: Medicare Other | Admitting: Family Medicine

## 2017-02-19 ENCOUNTER — Encounter: Payer: Self-pay | Admitting: Family Medicine

## 2017-02-19 VITALS — BP 122/78 | Ht 72.0 in | Wt 169.0 lb

## 2017-02-19 DIAGNOSIS — G8929 Other chronic pain: Secondary | ICD-10-CM | POA: Diagnosis not present

## 2017-02-19 DIAGNOSIS — E7849 Other hyperlipidemia: Secondary | ICD-10-CM

## 2017-02-19 DIAGNOSIS — G4700 Insomnia, unspecified: Secondary | ICD-10-CM | POA: Diagnosis not present

## 2017-02-19 DIAGNOSIS — M79604 Pain in right leg: Secondary | ICD-10-CM | POA: Diagnosis not present

## 2017-02-19 DIAGNOSIS — F411 Generalized anxiety disorder: Secondary | ICD-10-CM | POA: Diagnosis not present

## 2017-02-19 DIAGNOSIS — E784 Other hyperlipidemia: Secondary | ICD-10-CM

## 2017-02-19 DIAGNOSIS — R7301 Impaired fasting glucose: Secondary | ICD-10-CM | POA: Diagnosis not present

## 2017-02-19 LAB — POCT GLYCOSYLATED HEMOGLOBIN (HGB A1C): HEMOGLOBIN A1C: 5.1

## 2017-02-19 MED ORDER — ALPRAZOLAM 1 MG PO TABS: ORAL_TABLET | ORAL | 4 refills | Status: DC

## 2017-02-19 MED ORDER — HYDROCODONE-ACETAMINOPHEN 5-325 MG PO TABS: ORAL_TABLET | ORAL | 0 refills | Status: DC

## 2017-02-19 MED ORDER — TEMAZEPAM 30 MG PO CAPS: ORAL_CAPSULE | ORAL | 4 refills | Status: DC

## 2017-02-19 NOTE — Progress Notes (Signed)
   Subjective:    Patient ID: [name], male    DOB: 10-21-1951, 66 y.o.   MRN: [id number]  HPI This patient was seen today for chronic pain  The medication list was reviewed and updated.   -Compliance with medication: Takes pain medication 3 times per day.  - Number patient states they take daily: Takes 3   -when was the last dose patient took? Last dose at 0800.  The patient was advised the importance of maintaining medication and not using illegal substances with these.  Refills needed: Yes  The patient was educated that we can provide 3 monthly scripts for their medication, it is their responsibility to follow the instructions.  Side effects or complications from medications: None   Patient is aware that pain medications are meant to minimize the severity of the pain to allow their pain levels to improve to allow for better function. They are aware of that pain medications cannot totally remove their pain.  Due for UDT ( at least once per year) : Due September 2018  States no other concerns this visit.  Patient still smokes he knows he needs to quit He does take his anxiety medicine keeps it under control He states he's not depressed He does take his medication help him sleep E is been on this for years He did have some recent lab work completed which showed hyperglycemia Cholesterol did not look bad He does consume a fair amount of starches in his diet He states his moods overall are doing well   Review of Systems  Constitutional: Negative for activity change, appetite change and fatigue.  HENT: Negative for congestion.   Respiratory: Negative for cough.   Cardiovascular: Negative for chest pain.  Gastrointestinal: Negative for abdominal pain.  Endocrine: Negative for polydipsia and polyphagia.  Neurological: Negative for weakness.  Psychiatric/Behavioral: Negative for confusion.       Objective:   Physical Exam  Constitutional: He appears well-nourished. No  distress.  Cardiovascular: Normal rate, regular rhythm and normal heart sounds.   No murmur heard. Pulmonary/Chest: Effort normal and breath sounds normal. No respiratory distress.  Musculoskeletal: He exhibits no edema.  Lymphadenopathy:    He has no cervical adenopathy.  Neurological: He is alert.  Psychiatric: His behavior is normal.  Vitals reviewed.     It is necessary for this patient lives in assisted living and get help with his medications    Assessment & Plan:  Chronic leg pain from where the amputation was. Using pain medicine no greater than 3 times per day not causing drowsiness.  Patient smokes he was counseled to totally quit smoking  Hyperglycemia A1c looks great no sign of diabetes counseled regarding minimizing starches potatoes sugary drinks  Insomnia has chronic long-term insomnia continue medications  Generalized anxiety disorder does well with medicine does not cause drowsiness has long-standing psychiatric and anxiety issues does well with medicine stick with this  GERD under good control with medication continue current measures follow-up 3 months

## 2017-02-20 ENCOUNTER — Telehealth: Payer: Self-pay | Admitting: Family Medicine

## 2017-02-20 NOTE — Telephone Encounter (Signed)
Nurses/Erica-please assist with filling out the rest of this form. When this is faxed to the number requested please make sure a copy of this is scanned into the system so if for some reason they do not receive this and we receive this form again we will have a template to work from. I have filled out this form once-I have no idea where it went to be on my office. I don't mind filling out a second time but I prefer not to fill it out a third time thank you

## 2017-03-07 ENCOUNTER — Encounter: Payer: Self-pay | Admitting: Family Medicine

## 2017-03-07 ENCOUNTER — Telehealth: Payer: Self-pay | Admitting: Family Medicine

## 2017-03-07 NOTE — Telephone Encounter (Signed)
A letter was completed and sent to [facility], please handle accordingly thank you

## 2017-03-07 NOTE — Telephone Encounter (Signed)
[name] from [facility] is requesting a written statement stating that it is OK for patient to self-medicate on Sundays when he leaves the facility.  He goes to church each Sunday and eats with his family.  Please fax to number provided.  FAX # [phone]

## 2017-03-08 NOTE — Telephone Encounter (Signed)
[name] came by regarding the letter.  She is needing this revised to state that he can self-medicate on Sundays and any other time he visits with his family.  He is doing a lot of visitations.

## 2017-03-10 NOTE — Telephone Encounter (Signed)
Erica-please reprint this letter and I will hand write the changes she is requesting on the letter

## 2017-03-13 ENCOUNTER — Telehealth: Payer: Self-pay | Admitting: Family Medicine

## 2017-03-13 NOTE — Telephone Encounter (Signed)
Social worker dropped off a drug review to be signed. Form is in box in dr's box.

## 2017-03-14 NOTE — Telephone Encounter (Signed)
This was signed.

## 2017-03-26 ENCOUNTER — Other Ambulatory Visit: Payer: Self-pay | Admitting: Family Medicine

## 2017-05-04 NOTE — Telephone Encounter (Signed)
error 

## 2017-05-21 ENCOUNTER — Ambulatory Visit: Payer: Medicare Other | Admitting: Family Medicine

## 2017-05-30 ENCOUNTER — Telehealth: Payer: Self-pay | Admitting: Family Medicine

## 2017-05-30 MED ORDER — HYDROCODONE-ACETAMINOPHEN 5-325 MG PO TABS: ORAL_TABLET | ORAL | 0 refills | Status: DC

## 2017-05-30 NOTE — Telephone Encounter (Signed)
Patient uses [facility]. His Xanax was just filled on 05/27/17 and his Hydrocodone will be due on 06/01/17. This pharmacy is closed on the weekends and will be closed next Monday- Wednesday. Please advise?

## 2017-05-30 NOTE — Telephone Encounter (Signed)
Please try to arrange for the patient to have the hydrocodone filled early, patient has my permission to get hydrocodone filled tomorrow morning, if they need a prescription faxed to them this can be done

## 2017-05-30 NOTE — Telephone Encounter (Signed)
Called pharmacy a prescription may be faxed. Prescription printed and awaiting signature.

## 2017-05-30 NOTE — Telephone Encounter (Signed)
I checked the drug registry. It does not register where he gets his medicine. I am not certain if he gets it to [facility]? Talk with  his pharmacy. Verify when Ms. his medicines do also find out are the truly closed?

## 2017-05-30 NOTE — Telephone Encounter (Signed)
Patient has an appointment tomorrow afternoon for his medication check.  He wants to know if he can go ahead and get his xanax and hydrocodone Rx today or early in the morning because his pharmacy will be closed next Monday and Tuesday for the holiday and he is worried that they will not have time to deliver it to him if they get it by the time he leaves the office tomorrow.

## 2017-05-31 ENCOUNTER — Encounter: Payer: Self-pay | Admitting: Family Medicine

## 2017-05-31 ENCOUNTER — Ambulatory Visit (INDEPENDENT_AMBULATORY_CARE_PROVIDER_SITE_OTHER): Payer: Medicare Other | Admitting: Family Medicine

## 2017-05-31 VITALS — BP 102/74 | Ht 72.0 in | Wt 169.0 lb

## 2017-05-31 DIAGNOSIS — G8929 Other chronic pain: Secondary | ICD-10-CM

## 2017-05-31 DIAGNOSIS — M79604 Pain in right leg: Secondary | ICD-10-CM | POA: Diagnosis not present

## 2017-05-31 NOTE — Progress Notes (Signed)
   Subjective:    Patient ID: [name], male    DOB: 08-Jul-1951, 66 y.o.   MRN: [id number]  HPI This patient was seen today for chronic pain  The medication list was reviewed and updated.   -Compliance with medication: yes  - Number patient states they take daily: three a day  -when was the last dose patient took? today  The patient was advised the importance of maintaining medication and not using illegal substances with these.  Refills needed: none  The patient was educated that we can provide 3 monthly scripts for their medication, it is their responsibility to follow the instructions.  Side effects or complications from medications: none  Patient is aware that pain medications are meant to minimize the severity of the pain to allow their pain levels to improve to allow for better function. They are aware of that pain medications cannot totally remove their pain.  Due for UDT ( at least once per year) : last one sept 2017  Concerns about back pain.        Review of Systems  Constitutional: Negative for activity change.  Gastrointestinal: Negative for abdominal pain and vomiting.  Neurological: Negative for weakness.  Psychiatric/Behavioral: Negative for confusion.       Objective:   Physical Exam  Constitutional: He appears well-nourished.  Cardiovascular: Normal rate, regular rhythm and normal heart sounds.   No murmur heard. Pulmonary/Chest: Effort normal and breath sounds normal.  Musculoskeletal: He exhibits no edema.  Lymphadenopathy:    He has no cervical adenopathy.  Neurological: He is alert.  Psychiatric: His behavior is normal.  Vitals reviewed.         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

## 2017-06-12 ENCOUNTER — Other Ambulatory Visit: Payer: Self-pay | Admitting: *Deleted

## 2017-06-12 ENCOUNTER — Encounter: Payer: Self-pay | Admitting: *Deleted

## 2017-06-12 ENCOUNTER — Telehealth: Payer: Self-pay | Admitting: Family Medicine

## 2017-06-12 NOTE — Telephone Encounter (Signed)
Please write out order for discontinuation of the cream then this can be faxed to the facility-also discontinue it from Epic

## 2017-06-12 NOTE — Telephone Encounter (Signed)
D/c order for triamcinolone ready. Called gloria to get fax number and she states she will pickup tomorrow. She also wants a list of current meds. Med list wrote out and put up front with d/c order for pickup.

## 2017-06-12 NOTE — Telephone Encounter (Signed)
Requesting D/C order for triamcinolone cream.  He has completed this medication and no longer needs it.

## 2017-07-01 ENCOUNTER — Telehealth: Payer: Self-pay | Admitting: Family Medicine

## 2017-07-01 ENCOUNTER — Other Ambulatory Visit: Payer: Self-pay | Admitting: *Deleted

## 2017-07-01 MED ORDER — HYDROCODONE-ACETAMINOPHEN 5-325 MG PO TABS: ORAL_TABLET | ORAL | 0 refills | Status: DC

## 2017-07-01 MED ORDER — HYDROCODONE-ACETAMINOPHEN 5-325 MG PO TABS
ORAL_TABLET | ORAL | 0 refills | Status: DC
Start: 2017-07-01 — End: 2017-07-01

## 2017-07-01 NOTE — Telephone Encounter (Signed)
Please call [facility] [phone], ask for Coralyn Mark, when complete and they will pick up.

## 2017-07-01 NOTE — Telephone Encounter (Signed)
Requesting Rx for HYDROcodone-acetaminophen (NORCO/VICODIN) 5-325 MG tablet.  He will be out tomorrow.

## 2017-07-01 NOTE — Telephone Encounter (Signed)
Prescription up front for pick up. Rest home notified.

## 2017-07-01 NOTE — Telephone Encounter (Signed)
Last check up 05/31/17- was only given one script

## 2017-07-01 NOTE — Telephone Encounter (Signed)
The rest home only allows one prescription at a time

## 2017-07-01 NOTE — Telephone Encounter (Signed)
May have a prescription for 90 tablets 1 3 times a day-he stays at a rest home they typically get one prescription at a time-touch base with the rest home if they can hold onto a next worse prescription we can do 2 scripts

## 2017-07-03 ENCOUNTER — Telehealth: Payer: Self-pay | Admitting: Family Medicine

## 2017-07-03 NOTE — Telephone Encounter (Signed)
Facility dropped off medication forms to be filled out and signed. Forms are in red folder in office.

## 2017-07-03 NOTE — Telephone Encounter (Signed)
The form was completed thank you 

## 2017-07-04 NOTE — Telephone Encounter (Signed)
Notified [name] at [facility] that this is complete.

## 2017-08-06 ENCOUNTER — Other Ambulatory Visit: Payer: Self-pay | Admitting: Family Medicine

## 2017-08-14 ENCOUNTER — Other Ambulatory Visit: Payer: Self-pay | Admitting: *Deleted

## 2017-08-14 NOTE — Telephone Encounter (Signed)
May have each with 5 refills

## 2017-08-19 MED ORDER — ALPRAZOLAM 1 MG PO TABS: ORAL_TABLET | ORAL | 5 refills | Status: DC

## 2017-08-19 MED ORDER — TEMAZEPAM 30 MG PO CAPS: ORAL_CAPSULE | ORAL | 5 refills | Status: DC

## 2017-08-28 ENCOUNTER — Telehealth: Payer: Self-pay | Admitting: Family Medicine

## 2017-08-28 NOTE — Telephone Encounter (Signed)
Would like to get hydrocodone prescription on Thursday -has office visit on Friday

## 2017-08-28 NOTE — Telephone Encounter (Signed)
Pt has an appt with [place] on Fri at 3. The case worker at the home that he is in called stating that he will be out by Sat and that the pharmacy closes at 5 on Friday and that him being seen at 3 will be pushing it. Caseworker is wanting to pick up a refill on Thursday afternoon so that he doesn't run out. Please advise.

## 2017-08-29 ENCOUNTER — Ambulatory Visit (INDEPENDENT_AMBULATORY_CARE_PROVIDER_SITE_OTHER): Payer: Medicare Other | Admitting: Family Medicine

## 2017-08-29 ENCOUNTER — Encounter: Payer: Self-pay | Admitting: Family Medicine

## 2017-08-29 VITALS — BP 124/76 | Ht 72.0 in | Wt 173.4 lb

## 2017-08-29 DIAGNOSIS — G4700 Insomnia, unspecified: Secondary | ICD-10-CM | POA: Diagnosis not present

## 2017-08-29 DIAGNOSIS — Z79891 Long term (current) use of opiate analgesic: Secondary | ICD-10-CM

## 2017-08-29 DIAGNOSIS — R51 Headache: Secondary | ICD-10-CM

## 2017-08-29 DIAGNOSIS — K219 Gastro-esophageal reflux disease without esophagitis: Secondary | ICD-10-CM | POA: Diagnosis not present

## 2017-08-29 DIAGNOSIS — Z23 Encounter for immunization: Secondary | ICD-10-CM

## 2017-08-29 DIAGNOSIS — G8929 Other chronic pain: Secondary | ICD-10-CM

## 2017-08-29 DIAGNOSIS — R519 Headache, unspecified: Secondary | ICD-10-CM

## 2017-08-29 HISTORY — DX: Gastro-esophageal reflux disease without esophagitis: K21.9

## 2017-08-29 MED ORDER — HYDROCODONE-ACETAMINOPHEN 5-325 MG PO TABS: ORAL_TABLET | ORAL | 0 refills | Status: DC

## 2017-08-29 MED ORDER — ACETAMINOPHEN 325 MG PO TABS: ORAL_TABLET | ORAL | 5 refills | Status: DC

## 2017-08-29 NOTE — Patient Instructions (Signed)
It is important to keep up with the headaches. Please fill-in the headache calendar please send it to [place] in 1 month  Follow-up in mid December  May use 325 mg Tylenol 2 each evening as needed for headaches.

## 2017-08-29 NOTE — Progress Notes (Signed)
   Subjective:    Patient ID: [name], male    DOB: Feb 09, 1951, 66 y.o.   MRN: [id number]  Headache   This is a chronic problem. The current episode started 1 to 4 weeks ago. The problem occurs intermittently. The problem has been waxing and waning. The pain is located in the left unilateral region. The pain radiates to the upper back and left neck. The pain quality is not similar to prior headaches. The quality of the pain is described as aching. The pain is at a severity of 10/10. The pain is severe. Pertinent negatives include no abdominal pain, vomiting or weakness. He has tried nothing for the symptoms. The treatment provided no relief.    This patient was seen today for chronic pain  The medication list was reviewed and updated.   -Compliance with medication:  Takes daily   - Number patient states they take daily: Patient states takes 3 pain pills per day.   -when was the last dose patient took? Patient states last dose of pain medication was 0715 The patient was advised the importance of maintaining medication and not using illegal substances with these.  Refills needed: yes  The patient was educated that we can provide 3 monthly scripts for their medication, it is their responsibility to follow the instructions.  Side effects or complications from medications: None   Patient is aware that pain medications are meant to minimize the severity of the pain to allow their pain levels to improve to allow for better function. They are aware of that pain medications cannot totally remove their pain.  Due for UDT ( at least once per year) : Today.  States no concerns this visit Patient still smokes he knows he needs to quit although he is really cut back He relates pain medicine does help him with the pain and discomfort in his leg Medication help him sleep at night does well Reflux medicine keeps things under decent control     Review of Systems  Constitutional: Negative for  activity change.  Gastrointestinal: Negative for abdominal pain and vomiting.  Neurological: Positive for headaches. Negative for weakness.  Psychiatric/Behavioral: Negative for confusion.       Objective:   Physical Exam  Constitutional: He appears well-nourished.  Cardiovascular: Normal rate, regular rhythm and normal heart sounds.   No murmur heard. Pulmonary/Chest: Effort normal and breath sounds normal.  Musculoskeletal: He exhibits no edema.  Lymphadenopathy:    He has no cervical adenopathy.  Neurological: He is alert.  Psychiatric: His behavior is normal.  Vitals reviewed.    25 minutes was spent with the patient. Greater than half the time was spent in discussion and answering questions and counseling regarding the issues that the patient came in for today.      Assessment & Plan:  Chronic pain Urine drug screen collected Pain contract reviewed He is at a facility that controls his medications Frequent headaches he will do a headache calendar and send [place] some info in one month He will follow-up mid-December No lab work indicated currently Flu vaccine today

## 2017-08-29 NOTE — Telephone Encounter (Signed)
We took care of this problem today thank you

## 2017-08-30 ENCOUNTER — Ambulatory Visit: Payer: Medicare Other | Admitting: Family Medicine

## 2017-09-02 ENCOUNTER — Other Ambulatory Visit: Payer: Self-pay | Admitting: Family Medicine

## 2017-09-02 NOTE — Telephone Encounter (Signed)
Last seen 08/29/17

## 2017-09-04 LAB — TOXASSURE SELECT 13 (MW), URINE

## 2017-10-17 ENCOUNTER — Telehealth: Payer: Self-pay | Admitting: Family Medicine

## 2017-10-17 DIAGNOSIS — E785 Hyperlipidemia, unspecified: Secondary | ICD-10-CM

## 2017-10-17 DIAGNOSIS — R7301 Impaired fasting glucose: Secondary | ICD-10-CM

## 2017-10-17 NOTE — Telephone Encounter (Signed)
Lipid, metabolic 7, R7E- fasting hyperglycemia, hyperlipidemia

## 2017-10-17 NOTE — Telephone Encounter (Signed)
Spoke with patient and informed him per [place] have been ordered. Patient verbalized understanding.

## 2017-10-17 NOTE — Telephone Encounter (Signed)
Patient has appointment on 12/17 for follow up on pain and wondering if he needs labs done before appointment.

## 2017-11-04 ENCOUNTER — Other Ambulatory Visit: Payer: Self-pay | Admitting: Family Medicine

## 2017-11-14 ENCOUNTER — Ambulatory Visit: Payer: Medicare Other | Admitting: Family Medicine

## 2017-11-18 ENCOUNTER — Ambulatory Visit (INDEPENDENT_AMBULATORY_CARE_PROVIDER_SITE_OTHER): Payer: Medicare Other | Admitting: Family Medicine

## 2017-11-18 ENCOUNTER — Encounter: Payer: Self-pay | Admitting: Family Medicine

## 2017-11-18 VITALS — BP 122/88 | Ht 72.0 in | Wt 174.0 lb

## 2017-11-18 DIAGNOSIS — E785 Hyperlipidemia, unspecified: Secondary | ICD-10-CM | POA: Diagnosis not present

## 2017-11-18 DIAGNOSIS — M79604 Pain in right leg: Secondary | ICD-10-CM | POA: Diagnosis not present

## 2017-11-18 DIAGNOSIS — G8929 Other chronic pain: Secondary | ICD-10-CM | POA: Diagnosis not present

## 2017-11-18 MED ORDER — HYDROCODONE-ACETAMINOPHEN 5-325 MG PO TABS: ORAL_TABLET | ORAL | 0 refills | Status: DC

## 2017-11-18 NOTE — Progress Notes (Signed)
   Subjective:    Patient ID: [name], male    DOB: 08-Apr-1951, 66 y.o.   MRN: [id number]  HPI This patient was seen today for chronic pain  The medication list was reviewed and updated.   -Compliance with medication: Yes  - Number patient states they take daily: One three times daily -when was the last dose patient took? 7:30am   The patient was advised the importance of maintaining medication and not using illegal substances with these.  Refills needed: Yes  The patient was educated that we can provide 3 monthly scripts for their medication, it is their responsibility to follow the instructions.  Side effects or complications from medications: No  Patient is aware that pain medications are meant to minimize the severity of the pain to allow their pain levels to improve to allow for better function. They are aware of that pain medications cannot totally remove their pain.  Due for UDT ( at least once per year) : Sept 2019       Review of Systems  Constitutional: Negative for activity change.  Gastrointestinal: Negative for abdominal pain and vomiting.  Neurological: Negative for weakness.  Psychiatric/Behavioral: Negative for confusion.       Objective:   Physical Exam  Constitutional: He appears well-nourished.  Cardiovascular: Normal rate, regular rhythm and normal heart sounds.  No murmur heard. Pulmonary/Chest: Effort normal and breath sounds normal.  Musculoskeletal: He exhibits no edema.  Lymphadenopathy:    He has no cervical adenopathy.  Neurological: He is alert.  Psychiatric: His behavior is normal.  Vitals reviewed.         Assessment & Plan:  Chronic leg pain-he has had previous amputation 3 prescriptions pain medicine given drug registry was checked pharmacy was called  Patient is due lab work which is ordered  Patient encouraged to quit smoking this is unlikely  Severe insomnia issues does use medication he does know not to take his  pain medicine at the same time of insomnia  Follow-up 3 months

## 2017-12-04 ENCOUNTER — Telehealth: Payer: Self-pay | Admitting: Family Medicine

## 2017-12-04 ENCOUNTER — Other Ambulatory Visit: Payer: Self-pay | Admitting: Family Medicine

## 2017-12-04 NOTE — Telephone Encounter (Signed)
Please see phone message thanks

## 2017-12-04 NOTE — Telephone Encounter (Signed)
Patient called regarding Rx for his tylenol.  He said that the instructions say to take 2 a day, but he sometimes takes more than this because he was under the impression he could take as needed.  He is requesting a new Rx to be sent to [facility] with a higher quantity.

## 2017-12-04 NOTE — Telephone Encounter (Signed)
Patient may use Tylenol 500 mg maximum use 3/day (in addition to the Tylenol that is already in his hydrocodone medication) if this needs to be written out as 500 mg Tylenol 3 times daily as needed I will be happy to sign it when I come back on Thursday

## 2017-12-04 NOTE — Telephone Encounter (Signed)
See RX request- Patient wants to increase Tylenol intake- currently can take 1000mg  per day in addition to tylenol in his Vicodin- Please advise

## 2017-12-05 NOTE — Telephone Encounter (Signed)
Prescription written.Left a message on machine that we have gotten the rx sent in . Asked that he call back to confirm receipt of message.

## 2017-12-11 NOTE — Telephone Encounter (Signed)
Pt is aware.He states he has already picked it up.

## 2018-01-02 ENCOUNTER — Telehealth: Payer: Self-pay | Admitting: Family Medicine

## 2018-01-02 NOTE — Telephone Encounter (Signed)
Please sign off on drug review in red folder placed in yellow basket.  Contact Ephriam Jenkins at [facility] when complete. [phone]

## 2018-01-06 NOTE — Telephone Encounter (Signed)
This form was completed

## 2018-01-13 ENCOUNTER — Encounter: Payer: Self-pay | Admitting: Family Medicine

## 2018-01-13 ENCOUNTER — Ambulatory Visit (HOSPITAL_COMMUNITY)
Admission: RE | Admit: 2018-01-13 | Discharge: 2018-01-13 | Disposition: A | Discharge status: Home / Self Care | Payer: Medicare Other | Source: Ambulatory Visit | Attending: Family Medicine | Admitting: Family Medicine

## 2018-01-13 ENCOUNTER — Other Ambulatory Visit (HOSPITAL_COMMUNITY)
Admission: RE | Admit: 2018-01-13 | Discharge: 2018-01-13 | Disposition: A | Discharge status: Home / Self Care | Payer: Medicare Other | Source: Ambulatory Visit | Attending: Family Medicine | Admitting: Family Medicine

## 2018-01-13 ENCOUNTER — Ambulatory Visit (INDEPENDENT_AMBULATORY_CARE_PROVIDER_SITE_OTHER): Payer: Medicare Other | Admitting: Family Medicine

## 2018-01-13 ENCOUNTER — Other Ambulatory Visit: Payer: Self-pay | Admitting: *Deleted

## 2018-01-13 VITALS — BP 122/84 | Temp 98.6°F | Ht <= 58 in | Wt 171.1 lb

## 2018-01-13 DIAGNOSIS — J019 Acute sinusitis, unspecified: Secondary | ICD-10-CM | POA: Diagnosis not present

## 2018-01-13 DIAGNOSIS — R079 Chest pain, unspecified: Secondary | ICD-10-CM | POA: Diagnosis not present

## 2018-01-13 DIAGNOSIS — B9689 Other specified bacterial agents as the cause of diseases classified elsewhere: Secondary | ICD-10-CM | POA: Diagnosis not present

## 2018-01-13 DIAGNOSIS — R059 Cough, unspecified: Secondary | ICD-10-CM

## 2018-01-13 DIAGNOSIS — R918 Other nonspecific abnormal finding of lung field: Secondary | ICD-10-CM | POA: Insufficient documentation

## 2018-01-13 DIAGNOSIS — R062 Wheezing: Secondary | ICD-10-CM | POA: Diagnosis not present

## 2018-01-13 DIAGNOSIS — R05 Cough: Secondary | ICD-10-CM | POA: Insufficient documentation

## 2018-01-13 DIAGNOSIS — R0602 Shortness of breath: Secondary | ICD-10-CM | POA: Diagnosis not present

## 2018-01-13 LAB — BASIC METABOLIC PANEL
Anion gap: 13 (ref 5–15)
BUN: 14 mg/dL (ref 6–20)
CALCIUM: 9 mg/dL (ref 8.9–10.3)
CO2: 32 mmol/L (ref 22–32)
CREATININE: 0.96 mg/dL (ref 0.61–1.24)
Chloride: 91 mmol/L — ABNORMAL LOW (ref 101–111)
GFR calc Af Amer: >60 mL/min (ref >60)
Glucose, Bld: 130 mg/dL — ABNORMAL HIGH (ref 65–99)
Potassium: 4.5 mmol/L (ref 3.5–5.1)
Sodium: 136 mmol/L (ref 135–145)

## 2018-01-13 LAB — CBC WITH DIFFERENTIAL/PLATELET
Basophils Absolute: 0 10*3/uL (ref 0.0–0.1)
Basophils Relative: 0 %
EOS ABS: 0 10*3/uL (ref 0.0–0.7)
EOS PCT: 0 %
HCT: 46.8 % (ref 39.0–52.0)
Hemoglobin: 15.4 g/dL (ref 13.0–17.0)
LYMPHS ABS: 1.9 10*3/uL (ref 0.7–4.0)
Lymphocytes Relative: 21 %
MCH: 32 pg (ref 26.0–34.0)
MCHC: 32.9 g/dL (ref 30.0–36.0)
MCV: 97.3 fL (ref 78.0–100.0)
Monocytes Absolute: 0.8 10*3/uL (ref 0.1–1.0)
Monocytes Relative: 9 %
Neutro Abs: 6.2 10*3/uL (ref 1.7–7.7)
Neutrophils Relative %: 70 %
PLATELETS: 191 10*3/uL (ref 150–400)
RBC: 4.81 MIL/uL (ref 4.22–5.81)
RDW: 12.3 % (ref 11.5–15.5)
WBC: 8.9 10*3/uL (ref 4.0–10.5)

## 2018-01-13 MED ORDER — ALBUTEROL SULFATE (2.5 MG/3ML) 0.083% IN NEBU
2.5000 mg | INHALATION_SOLUTION | Freq: Once | RESPIRATORY_TRACT | Status: AC
  Administered 2018-01-13: 2.5 mg via RESPIRATORY_TRACT

## 2018-01-13 MED ORDER — ALBUTEROL SULFATE HFA 108 (90 BASE) MCG/ACT IN AERS
2.0000 | INHALATION_SPRAY | Freq: Four times a day (QID) | RESPIRATORY_TRACT | 2 refills | Status: DC | PRN
Start: 2018-01-13 — End: 2018-08-12

## 2018-01-13 MED ORDER — AMOXICILLIN-POT CLAVULANATE 875-125 MG PO TABS: 1.0000 | ORAL_TABLET | Freq: Two times a day (BID) | ORAL | 0 refills | Status: DC

## 2018-01-13 MED ORDER — CEFTRIAXONE SODIUM 500 MG IJ SOLR
500.0000 mg | Freq: Once | INTRAMUSCULAR | Status: AC
  Administered 2018-01-13: 500 mg via INTRAMUSCULAR

## 2018-01-13 MED ORDER — PREDNISONE 20 MG PO TABS: ORAL_TABLET | ORAL | 0 refills | Status: DC

## 2018-01-13 NOTE — Progress Notes (Signed)
   Subjective:    Patient ID: [name], male    DOB: 24-Dec-1950, 67 y.o.   MRN: [id number]  HPI  Patient is here today with complaints of productive cough,chest congestion.Runny nose,no appetite,had a sore throat,but gone now.Has been sweating and had chills, has not check temp.This has been on going for a week and a half.Complaining of tongue and lips tinging not often. No sob Feels rough Some wheezing at night Lost some weight this week OJ and tylenol Patient relates coughing congestion not feeling well denies feeling short of breath Started sore throat then cough congestion No fever Felt bad  Review of Systems  Constitutional: Negative for activity change, chills and fever.  HENT: Positive for congestion and rhinorrhea. Negative for ear pain.   Eyes: Negative for discharge.  Respiratory: Positive for cough. Negative for wheezing.   Cardiovascular: Negative for chest pain.  Gastrointestinal: Negative for nausea and vomiting.  Musculoskeletal: Negative for arthralgias.       Objective:   Physical Exam  Constitutional: He appears well-developed.  HENT:  Head: Normocephalic and atraumatic.  Mouth/Throat: Oropharynx is clear and moist. No oropharyngeal exudate.  Eyes: Right eye exhibits no discharge. Left eye exhibits no discharge.  Neck: Normal range of motion.  Cardiovascular: Normal rate, regular rhythm and normal heart sounds.  No murmur heard. Pulmonary/Chest: Effort normal. No respiratory distress. He has wheezes. He has no rales.  Lymphadenopathy:    He has no cervical adenopathy.  Neurological: He exhibits normal muscle tone.  Skin: Skin is warm and dry.  Nursing note and vitals reviewed.  His O2 saturations were 87% even after neb treatment but patient denied feeling short of breath  25 minutes was spent with the patient.  This statement verifies that 25 minutes was indeed spent with the patient. Greater than half the time was spent in discussion, counseling  and answering questions  regarding the issues that the patient came in for today as reflected in the diagnosis (s) please refer to documentation for further details.      Assessment & Plan:  Upper respiratory Bacterial infection is possible secondary to a virus We will go ahead and do a stat chest x-ray lab work neb treatment was given with some improvement shot of Rocephin given antibiotics and medications were sent in

## 2018-01-13 NOTE — Patient Instructions (Addendum)
Please do chest xray  I sent in the prescription for your antibiotics Please start today  Follow up if worse

## 2018-01-13 NOTE — Progress Notes (Signed)
pred 

## 2018-01-16 ENCOUNTER — Encounter: Payer: Self-pay | Admitting: Family Medicine

## 2018-01-16 ENCOUNTER — Ambulatory Visit (INDEPENDENT_AMBULATORY_CARE_PROVIDER_SITE_OTHER): Payer: Medicare Other | Admitting: Family Medicine

## 2018-01-16 VITALS — BP 122/80 | HR 88 | Temp 98.1°F | Ht 72.0 in | Wt 173.0 lb

## 2018-01-16 DIAGNOSIS — R062 Wheezing: Secondary | ICD-10-CM | POA: Diagnosis not present

## 2018-01-16 DIAGNOSIS — R0600 Dyspnea, unspecified: Secondary | ICD-10-CM

## 2018-01-16 NOTE — Patient Instructions (Signed)
How to Use a Metered Dose Inhaler A metered dose inhaler is a handheld device for taking medicine that must be breathed into the lungs (inhaled). The device can be used to deliver a variety of inhaled medicines, including:  Quick relief or rescue medicines, such as bronchodilators.  Controller medicines, such as corticosteroids.  The medicine is delivered by pushing down on a metal canister to release a preset amount of spray and medicine. Each device contains the amount of medicine that is needed for a preset number of uses (inhalations). Your health care provider may recommend that you use a spacer with your inhaler to help you take the medicine more effectively. A spacer is a plastic tube with a mouthpiece on one end and an opening that connects to the inhaler on the other end. A spacer holds the medicine in a tube for a short time, which allows you to inhale more medicine. What are the risks? If you do not use your inhaler correctly, medicine might not reach your lungs to help you breathe. Inhaler medicine can cause side effects, such as:  Mouth or throat infection.  Cough.  Hoarseness.  Headache.  Nausea and vomiting.  Lung infection (pneumonia) in people who have a lung condition called COPD.  How to use a metered dose inhaler without a spacer 1. Remove the cap from the inhaler. 2. If you are using the inhaler for the first time, shake it for 5 seconds, turn it away from your face, then release 4 puffs into the air. This is called priming. 3. Shake the inhaler for 5 seconds. 4. Position the inhaler so the top of the canister faces up. 5. Put your index finger on the top of the medicine canister. Support the bottom of the inhaler with your thumb. 6. Breathe out normally and as completely as possible, away from the inhaler. 7. Either place the inhaler between your teeth and close your lips tightly around the mouthpiece, or hold the inhaler 1-2 inches (2.5-5 cm) away from your open  mouth. Keep your tongue down out of the way. If you are unsure which technique to use, ask your health care provider. 8. Press the canister down with your index finger to release the medicine, then inhale deeply and slowly through your mouth (not your nose) until your lungs are completely filled. Inhaling should take 4-6 seconds. 9. Hold the medicine in your lungs for 5-10 seconds (10 seconds is best). This helps the medicine get into the small airways of your lungs. 10. With your lips in a tight circle (pursed), breathe out slowly. 11. Repeat steps 3-10 until you have taken the number of puffs that your health care provider directed. Wait about 1 minute between puffs or as directed. 12. Put the cap on the inhaler. 13. If you are using a steroid inhaler, rinse your mouth with water, gargle, and spit out the water. Do not swallow the water. How to use a metered dose inhaler with a spacer 1. Remove the cap from the inhaler. 2. If you are using the inhaler for the first time, shake it for 5 seconds, turn it away from your face, then release 4 puffs into the air. This is called priming. 3. Shake the inhaler for 5 seconds. 4. Place the open end of the spacer onto the inhaler mouthpiece. 5. Position the inhaler so the top of the canister faces up and the spacer mouthpiece faces you. 6. Put your index finger on the top of the medicine canister.  Support the bottom of the inhaler and the spacer with your thumb. 7. Breathe out normally and as completely as possible, away from the spacer. 8. Place the spacer between your teeth and close your lips tightly around it. Keep your tongue down out of the way. 9. Press the canister down with your index finger to release the medicine, then inhale deeply and slowly through your mouth (not your nose) until your lungs are completely filled. Inhaling should take 4-6 seconds. 10. Hold the medicine in your lungs for 5-10 seconds (10 seconds is best). This helps the medicine  get into the small airways of your lungs. 11. With your lips in a tight circle (pursed), breathe out slowly. 12. Repeat steps 3-11 until you have taken the number of puffs that your health care provider directed. Wait about 1 minute between puffs or as directed. 13. Remove the spacer from the inhaler and put the cap on the inhaler. 14. If you are using a steroid inhaler, rinse your mouth with water, gargle, and spit out the water. Do not swallow the water. Follow these instructions at home:  Take your inhaled medicine only as told by your health care provider. Do not use the inhaler more than directed by your health care provider.  Keep all follow-up visits as told by your health care provider. This is important.  If your inhaler has a counter, you can check it to determine how full your inhaler is. If your inhaler does not have a counter, ask your health care provider when you will need to refill your inhaler and write the refill date on a calendar or on your inhaler canister. Note that you cannot know when an inhaler is empty by shaking it.  Follow directions on the package insert for care and cleaning of your inhaler and spacer. Contact a health care provider if:  Symptoms are only partially relieved with your inhaler.  You are having trouble using your inhaler.  You have an increase in phlegm.  You have headaches. Get help right away if:  You feel little or no relief after using your inhaler.  You have dizziness.  You have a fast heart rate.  You have chills or a fever.  You have night sweats.  There is blood in your phlegm. Summary  A metered dose inhaler is a handheld device for taking medicine that must be breathed into the lungs (inhaled).  The medicine is delivered by pushing down on a metal canister to release a preset amount of spray and medicine.  Each device contains the amount of medicine that is needed for a preset number of uses (inhalations). This  information is not intended to replace advice given to you by your health care provider. Make sure you discuss any questions you have with your health care provider. Document Released: 11/19/2005 Document Revised: 10/09/2016 Document Reviewed: 10/09/2016 Elsevier Interactive Patient Education  2017 [employer].

## 2018-01-16 NOTE — Progress Notes (Signed)
   Subjective:    Patient ID: [name], male    DOB: 15-Apr-1951, 67 y.o.   MRN: [id number]  HPI Patient is here today for a recheck. He states he does not know why he is back was called this am and told that Dr Nicki Reaper would like to see him. Patient is here for follow-up and bronchial infection with wheezing and hypoxia states he is feeling some better coughing some some wheezing using albuterol as needed denies nausea vomiting denies high fevers  Review of Systems  Constitutional: Negative for activity change, chills and fever.  HENT: Positive for congestion and rhinorrhea. Negative for ear pain.   Eyes: Negative for discharge.  Respiratory: Positive for cough and wheezing.   Cardiovascular: Negative for chest pain.  Gastrointestinal: Negative for abdominal pain, constipation, nausea and vomiting.  Musculoskeletal: Negative for arthralgias.       Objective:   Physical Exam  Constitutional: He appears well-developed.  HENT:  Head: Normocephalic and atraumatic.  Mouth/Throat: Oropharynx is clear and moist. No oropharyngeal exudate.  Eyes: Right eye exhibits no discharge. Left eye exhibits no discharge.  Neck: Normal range of motion.  Cardiovascular: Normal rate, regular rhythm and normal heart sounds.  No murmur heard. Pulmonary/Chest: Effort normal and breath sounds normal. No respiratory distress. He has no wheezes. He has no rales.  Lymphadenopathy:    He has no cervical adenopathy.  Neurological: He exhibits normal muscle tone.  Skin: Skin is warm and dry.  Nursing note and vitals reviewed.  O2 sat 89% patient not respiratory distress       Assessment & Plan:  Bronchitis with reactive airway-continue albuterol 2 puffs 4 times daily, we will set up pulmonary function testing because of the risk of COPD with his smoking history, x-ray lab work reviewed with patient, keep follow-up office visit March 18 sooner if problems

## 2018-01-21 ENCOUNTER — Other Ambulatory Visit: Payer: Self-pay | Admitting: *Deleted

## 2018-01-21 DIAGNOSIS — R0600 Dyspnea, unspecified: Secondary | ICD-10-CM

## 2018-01-28 ENCOUNTER — Ambulatory Visit (HOSPITAL_COMMUNITY)
Admission: RE | Admit: 2018-01-28 | Discharge: 2018-01-28 | Disposition: A | Discharge status: Home / Self Care | Payer: Medicare Other | Source: Ambulatory Visit | Attending: Family Medicine | Admitting: Family Medicine

## 2018-01-28 DIAGNOSIS — R0600 Dyspnea, unspecified: Secondary | ICD-10-CM | POA: Insufficient documentation

## 2018-01-28 LAB — PULMONARY FUNCTION TEST
DL/VA % pred: 68 %
DL/VA: 3.25 ml/min/mmHg/L
DLCO COR % PRED: 46 %
DLCO COR: 16.22 ml/min/mmHg
DLCO UNC % PRED: 47 %
DLCO unc: 16.58 ml/min/mmHg
FEF 25-75 POST: 0.75 L/s
FEF 25-75 PRE: 0.45 L/s
FEF2575-%CHANGE-POST: 69 %
FEF2575-%Pred-Post: 25 %
FEF2575-%Pred-Pre: 15 %
FEV1-%Change-Post: 9 %
FEV1-%Pred-Post: 36 %
FEV1-%Pred-Pre: 33 %
FEV1-POST: 1.36 L
FEV1-Pre: 1.24 L
FEV1FVC-%CHANGE-POST: -11 %
FEV1FVC-%PRED-PRE: 67 %
FEV6-%Change-Post: 18 %
FEV6-%Pred-Post: 55 %
FEV6-%Pred-Pre: 47 %
FEV6-Post: 2.63 L
FEV6-Pre: 2.22 L
FEV6FVC-%Change-Post: -6 %
FEV6FVC-%PRED-POST: 91 %
FEV6FVC-%Pred-Pre: 97 %
FVC-%Change-Post: 24 %
FVC-%PRED-POST: 60 %
FVC-%PRED-PRE: 48 %
FVC-POST: 3.03 L
FVC-PRE: 2.43 L
PRE FEV1/FVC RATIO: 51 %
Post FEV1/FVC ratio: 45 %
Post FEV6/FVC ratio: 87 %
Pre FEV6/FVC Ratio: 92 %
RV % pred: 266 %
RV: 6.5 L
TLC % PRED: 124 %
TLC: 9.21 L

## 2018-01-28 MED ORDER — ALBUTEROL SULFATE (2.5 MG/3ML) 0.083% IN NEBU
2.5000 mg | INHALATION_SOLUTION | Freq: Once | RESPIRATORY_TRACT | Status: AC
  Administered 2018-01-28: 2.5 mg via RESPIRATORY_TRACT

## 2018-02-02 ENCOUNTER — Other Ambulatory Visit: Payer: Self-pay | Admitting: Family Medicine

## 2018-02-02 ENCOUNTER — Encounter: Payer: Self-pay | Admitting: Family Medicine

## 2018-02-02 DIAGNOSIS — J449 Chronic obstructive pulmonary disease, unspecified: Secondary | ICD-10-CM | POA: Insufficient documentation

## 2018-02-02 MED ORDER — BUDESONIDE-FORMOTEROL FUMARATE 80-4.5 MCG/ACT IN AERO: 2.0000 | INHALATION_SPRAY | Freq: Two times a day (BID) | RESPIRATORY_TRACT | 3 refills | Status: DC

## 2018-02-02 NOTE — Progress Notes (Unsigned)
symbicort

## 2018-02-03 ENCOUNTER — Other Ambulatory Visit: Payer: Self-pay

## 2018-02-03 MED ORDER — BUDESONIDE-FORMOTEROL FUMARATE 80-4.5 MCG/ACT IN AERO: 2.0000 | INHALATION_SPRAY | Freq: Two times a day (BID) | RESPIRATORY_TRACT | 3 refills | Status: DC

## 2018-02-13 ENCOUNTER — Telehealth: Payer: Self-pay

## 2018-02-13 NOTE — Telephone Encounter (Signed)
Prescriptions called into [facility]

## 2018-02-13 NOTE — Telephone Encounter (Signed)
[facility] is requesting Authorization to refill Alprazolam 1 mg take 1/2 by mouth twice daily at 8 am and 2 pm.take one tablet at bedtime # 60 ,also requesting Temazepam 30 mg take one capsule po Qhs. # 30 Please advise.

## 2018-02-13 NOTE — Telephone Encounter (Signed)
May have 30-day prescription with 5 refills on both medicines

## 2018-02-14 ENCOUNTER — Other Ambulatory Visit: Payer: Self-pay | Admitting: Family Medicine

## 2018-02-17 ENCOUNTER — Ambulatory Visit (INDEPENDENT_AMBULATORY_CARE_PROVIDER_SITE_OTHER): Payer: Medicare Other | Admitting: Family Medicine

## 2018-02-17 ENCOUNTER — Encounter: Payer: Self-pay | Admitting: Family Medicine

## 2018-02-17 VITALS — BP 138/88 | Ht 72.0 in | Wt 169.4 lb

## 2018-02-17 DIAGNOSIS — Z125 Encounter for screening for malignant neoplasm of prostate: Secondary | ICD-10-CM | POA: Diagnosis not present

## 2018-02-17 DIAGNOSIS — J449 Chronic obstructive pulmonary disease, unspecified: Secondary | ICD-10-CM | POA: Diagnosis not present

## 2018-02-17 DIAGNOSIS — E7849 Other hyperlipidemia: Secondary | ICD-10-CM

## 2018-02-17 DIAGNOSIS — M79604 Pain in right leg: Secondary | ICD-10-CM | POA: Diagnosis not present

## 2018-02-17 DIAGNOSIS — G4700 Insomnia, unspecified: Secondary | ICD-10-CM | POA: Diagnosis not present

## 2018-02-17 DIAGNOSIS — Z114 Encounter for screening for human immunodeficiency virus [HIV]: Secondary | ICD-10-CM

## 2018-02-17 DIAGNOSIS — Z79899 Other long term (current) drug therapy: Secondary | ICD-10-CM

## 2018-02-17 DIAGNOSIS — G8929 Other chronic pain: Secondary | ICD-10-CM | POA: Diagnosis not present

## 2018-02-17 DIAGNOSIS — Z1159 Encounter for screening for other viral diseases: Secondary | ICD-10-CM | POA: Diagnosis not present

## 2018-02-17 MED ORDER — HYDROCODONE-ACETAMINOPHEN 5-325 MG PO TABS: ORAL_TABLET | ORAL | 0 refills | Status: DC

## 2018-02-17 MED ORDER — TEMAZEPAM 30 MG PO CAPS: ORAL_CAPSULE | ORAL | 5 refills | Status: DC

## 2018-02-17 MED ORDER — ACETAMINOPHEN 500 MG PO TABS: ORAL_TABLET | ORAL | 5 refills | Status: DC

## 2018-02-17 MED ORDER — ALPRAZOLAM 1 MG PO TABS: ORAL_TABLET | ORAL | 5 refills | Status: DC

## 2018-02-17 NOTE — Progress Notes (Signed)
Subjective:    Patient ID: [name], male    DOB: [birth year], 67 y.o.   MRN: [id number]  HPI This patient was seen today for chronic pain  The medication list was reviewed and updated.   -Compliance with medication: yes  - Number patient states they take daily: 3  -when was the last dose patient took? About 1:45pm today  The patient was advised the importance of maintaining medication and not using illegal substances with these.  Here for refills and follow up  The patient was educated that we can provide 3 monthly scripts for their medication, it is their responsibility to follow the instructions.  Side effects or complications from medications: none  Patient is aware that pain medications are meant to minimize the severity of the pain to allow their pain levels to improve to allow for better function. They are aware of that pain medications cannot totally remove their pain.  Due for UDT ( at least once per year) : 08/29/2017  Patient does have COPD using Symbicort it does help him uses albuterol rarely denies any flareups recently does know he needs to quit smoking most days he avoid smoking but he does state he occasionally smokes  Patient suffers with insomnia.  This is been going on for a while.  The patient finds it necessary to use medication to help sleep.  Patient finds it if not using medication has significant troubles.  Denies abusing the medication.  Denies any negative side effects.  Patient here for follow-up regarding cholesterol.  Patient does try to maintain a reasonable diet.  Patient does take the medication on a regular basis.  Denies missing a dose.  The patient denies any obvious side effects.  Prior blood work results reviewed with the patient.  The patient is aware of his cholesterol goals and the need to keep it under good control to lessen the risk of disease.  Patient denies being depressed states his medication is helping headaches have been minimal  moods have been doing well tolerating medicine well in addition to this patient's anxiety issues under good control with the Xanax as he is using it currently he denies excessive drowsiness or drug feeling he does not drive  is patient is here today regarding follow-up regarding depression.  Patient relates compliance with medication.  Patient understands importance of taking the medication.  Patient denies any significant side effects.  Patient relates that the medication is still beneficial for them and they would like to continue the medication.  Patient is not having any threats of homicide or suicide.      Review of Systems  Constitutional: Negative for activity change.  HENT: Negative for congestion and rhinorrhea.   Respiratory: Negative for cough and shortness of breath.   Cardiovascular: Negative for chest pain.  Gastrointestinal: Negative for abdominal pain, diarrhea, nausea and vomiting.  Genitourinary: Negative for dysuria and hematuria.  Neurological: Negative for weakness and headaches.  Psychiatric/Behavioral: Negative for confusion.       Objective:   Physical Exam  Constitutional: He appears well-nourished.  Cardiovascular: Normal rate, regular rhythm and normal heart sounds.  No murmur heard. Pulmonary/Chest: Effort normal and breath sounds normal.  Musculoskeletal: He exhibits no edema.  Lymphadenopathy:    He has no cervical adenopathy.  Neurological: He is alert.  Psychiatric: His behavior is normal.  Vitals reviewed.         Assessment & Plan:  The patient was seen today as part of an evaluation  regarding hyperlipidemia. Recent lab work has been reviewed with the patient as well as the goals for good cholesterol care. In addition to this medications have been discussed the importance of compliance with diet and medications discussed as well. Patient has been informed of potential side effects of medications in the importance to notify us should any problems  occur. Finally the patient is aware that poor control of cholesterol, noncompliance can dramatically increase her risk of heart attack strokes and premature death. The patient will keep regular office visits and the patient does agreed to periodic lab work.  The patient was seen today as part of a comprehensive visit regarding pain control. Patient's compliance with the medication as well as discussion regarding effectiveness was completed. Prescriptions were written. Patient was advised to follow-up in 3 months. The patient was assessed for any signs of severe side effects. The patient was advised to take the medicine as directed and to report to [place] if any side effect issues.  Insomnia uses medication helps him continue this measure  Patient does use tobacco products.  Patient knows they should quit.  Patient is aware that smoking/in use of tobacco products increases their risk of heart disease, cancer, and COPD- lung issues.  Patient has been counseled to quit smoking/tobacco products.  Generalized anxiety with depression doing well overall with medication stable continue current measures  25 minutes was spent with the patient.  This statement verifies that 25 minutes was indeed spent with the patient. Greater than half the time was spent in discussion, counseling and answering questions  regarding the issues that the patient came in for today as reflected in the diagnosis (s) please refer to documentation for further details. We discussed his depression and anxiety lipids importance of keeping diet under good control importance of quitting smoking to avoid, complications from COPD also discussion on proper use of pain medicine drug registry was checked  Follow-up 3 months

## 2018-02-18 ENCOUNTER — Encounter: Payer: Self-pay | Admitting: Family Medicine

## 2018-02-21 ENCOUNTER — Encounter: Payer: Self-pay | Admitting: Internal Medicine

## 2018-02-25 ENCOUNTER — Telehealth: Payer: Self-pay | Admitting: Family Medicine

## 2018-02-25 NOTE — Telephone Encounter (Signed)
Last filled February 17, 2018 # 90. Please advise if ok to give.

## 2018-02-25 NOTE — Telephone Encounter (Signed)
Not ok if just filled on the 18th, needs to last 30 d, scott to see

## 2018-02-25 NOTE — Telephone Encounter (Signed)
Patient is requesting refill on hydrocodone to be call into [facility].

## 2018-02-25 NOTE — Telephone Encounter (Signed)
Discussed with pt. Pt states he never got a prescription the day he came in on the 18th. [facility] and they said last fill date was 2/21 and they do not have rx on file for him. Autumn overheard me talking to the pharm about pt and told me the day he came in dr scott had a nurse call [facility] to pick up all his prescriptions. Pharm never picked up. He has 3 scripts for hydrocodone picked up, alprazolam and temazepam. [facility]y at [facility] and she said she would pick up all 5 scripts for pt. Called pt back and let him know that pharm would pick up for him.

## 2018-02-25 NOTE — Telephone Encounter (Signed)
Scripts were pickup by [facility]

## 2018-03-03 ENCOUNTER — Telehealth: Payer: Self-pay | Admitting: Family Medicine

## 2018-03-03 NOTE — Telephone Encounter (Signed)
meds printed and placed in folder; folder in provider office

## 2018-03-03 NOTE — Telephone Encounter (Signed)
Case manager dropped off FL2 to be filled out. Also dropped off medication review. In nurse box.

## 2018-03-05 NOTE — Telephone Encounter (Signed)
Please stamp up both forms

## 2018-03-10 NOTE — Telephone Encounter (Signed)
Message routed to medical records nurses do not have the forms. Danae Chen let me know if you do not have the forms

## 2018-03-11 NOTE — Telephone Encounter (Signed)
Form that was completed turns out to be correct after all. Case worker has been notified that it is ready.

## 2018-03-11 NOTE — Telephone Encounter (Signed)
[name] called to check status, asked Erica-states she don't have form, looked & it's in Dr. Bary Leriche office  [place] spoke with [name] - figured out that it was not the correct form, [name] is going to fax [place] correct form to complete

## 2018-03-11 NOTE — Telephone Encounter (Signed)
Please call [name] when ready  Cell#  [phone]   [name] would like [place] to please document pt's COPD, prosthesis, & migraines

## 2018-04-07 ENCOUNTER — Telehealth: Payer: Self-pay

## 2018-04-07 NOTE — Telephone Encounter (Signed)
[facility] is wanting to know if they can get a D/C order on Ventolin Inhaler as they say he does not use this medication.Please advise.[phone]

## 2018-04-08 NOTE — Telephone Encounter (Signed)
Please give discontinuation order for Ventolin if it is necessary to write this out so I sign it and send it to him that can be done as well

## 2018-04-09 NOTE — Telephone Encounter (Signed)
Order to d/c Ventolin has been faxed to [facility]

## 2018-04-09 NOTE — Telephone Encounter (Signed)
I have written the D/c order awaiting Dr.Signature to fax.

## 2018-04-15 ENCOUNTER — Ambulatory Visit (INDEPENDENT_AMBULATORY_CARE_PROVIDER_SITE_OTHER): Payer: Medicare Other | Admitting: Family Medicine

## 2018-04-15 ENCOUNTER — Encounter: Payer: Self-pay | Admitting: Family Medicine

## 2018-04-15 VITALS — BP 132/76 | Temp 98.3°F | Ht 72.0 in | Wt 165.0 lb

## 2018-04-15 DIAGNOSIS — J019 Acute sinusitis, unspecified: Secondary | ICD-10-CM

## 2018-04-15 DIAGNOSIS — R6889 Other general symptoms and signs: Secondary | ICD-10-CM

## 2018-04-15 MED ORDER — AMOXICILLIN 500 MG PO TABS: 500.0000 mg | ORAL_TABLET | Freq: Three times a day (TID) | ORAL | 0 refills | Status: DC

## 2018-04-15 NOTE — Progress Notes (Signed)
   Subjective:    Patient ID: [name], male    DOB: 09/14/1951, 67 y.o.   MRN: [id number]  Influenza  This is a new problem. The current episode started in the past 7 days. Associated symptoms include congestion, fatigue, a fever and headaches. Pertinent negatives include no arthralgias, chest pain, chills, coughing, nausea or vomiting. Associated symptoms comments: Sweats. Body aches. He has tried acetaminophen (orange juice, gatorade) for the symptoms.  Viral-like illness started yesterday morning with body aches runny nose a little bit of drainage a little bit of coughing relates low-grade fever denies wheezing difficulty breathing energy level subpar    Review of Systems  Constitutional: Positive for fatigue and fever. Negative for activity change and chills.  HENT: Positive for congestion. Negative for ear pain and rhinorrhea.   Eyes: Negative for discharge.  Respiratory: Negative for cough and wheezing.   Cardiovascular: Negative for chest pain.  Gastrointestinal: Negative for nausea and vomiting.  Musculoskeletal: Negative for arthralgias.  Neurological: Positive for headaches.       Objective:   Physical Exam  Constitutional: He appears well-developed.  HENT:  Head: Normocephalic.  Mouth/Throat: Oropharynx is clear and moist. No oropharyngeal exudate.  Neck: Normal range of motion.  Cardiovascular: Normal rate, regular rhythm and normal heart sounds.  No murmur heard. Pulmonary/Chest: Effort normal and breath sounds normal. He has no wheezes.  Lymphadenopathy:    He has no cervical adenopathy.  Neurological: He exhibits normal muscle tone.  Skin: Skin is warm and dry.  Nursing note and vitals reviewed.         Assessment & Plan:  Viral syndrome Secondary rhinosinusitis Antibiotics prescribed warnings discussed Lab work x-rays not indicated currently Follow-up if not improving over the next few days

## 2018-05-06 ENCOUNTER — Encounter: Payer: Self-pay | Admitting: Internal Medicine

## 2018-05-06 ENCOUNTER — Telehealth: Payer: Self-pay | Admitting: Nurse Practitioner

## 2018-05-06 ENCOUNTER — Ambulatory Visit: Payer: Medicare Other | Admitting: Nurse Practitioner

## 2018-05-06 NOTE — Telephone Encounter (Signed)
PATIENT WAS A NO SHOW AND LETTER SENT  °

## 2018-05-09 NOTE — Telephone Encounter (Signed)
Noted  

## 2018-05-20 ENCOUNTER — Ambulatory Visit (INDEPENDENT_AMBULATORY_CARE_PROVIDER_SITE_OTHER): Payer: Medicare Other | Admitting: Family Medicine

## 2018-05-20 ENCOUNTER — Encounter: Payer: Self-pay | Admitting: Family Medicine

## 2018-05-20 VITALS — BP 116/70 | Ht 72.0 in | Wt 166.0 lb

## 2018-05-20 DIAGNOSIS — Z1211 Encounter for screening for malignant neoplasm of colon: Secondary | ICD-10-CM

## 2018-05-20 DIAGNOSIS — G8929 Other chronic pain: Secondary | ICD-10-CM

## 2018-05-20 DIAGNOSIS — M79604 Pain in right leg: Secondary | ICD-10-CM

## 2018-05-20 DIAGNOSIS — G4700 Insomnia, unspecified: Secondary | ICD-10-CM

## 2018-05-20 MED ORDER — HYDROCODONE-ACETAMINOPHEN 5-325 MG PO TABS: ORAL_TABLET | ORAL | 0 refills | Status: DC
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
## 2018-05-21 NOTE — Progress Notes (Signed)
Per Denton Ar she states the name of the facility is [facility].P# [phone] F# [phone].Located at [place]. I called [phone] per Dr.Scotts request to speak with [place] [facility] [place] [facility] [place],left a message to call us back.

## 2018-05-21 NOTE — Progress Notes (Signed)
Got [name] on the line for Dr.Scott whom is the supervisor at the [place] [facility] [place] [facility].

## 2018-05-25 NOTE — Progress Notes (Signed)
Nurses-please let me speak with the patient's sister-Jean

## 2018-05-27 ENCOUNTER — Encounter: Payer: Self-pay | Admitting: Family Medicine
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
## 2018-06-02 ENCOUNTER — Other Ambulatory Visit: Payer: Self-pay | Admitting: Family Medicine

## 2018-06-03 ENCOUNTER — Encounter: Payer: Self-pay | Admitting: Internal Medicine

## 2018-06-16 DIAGNOSIS — Z1159 Encounter for screening for other viral diseases: Secondary | ICD-10-CM | POA: Diagnosis not present

## 2018-06-16 DIAGNOSIS — Z114 Encounter for screening for human immunodeficiency virus [HIV]: Secondary | ICD-10-CM | POA: Diagnosis not present

## 2018-06-16 DIAGNOSIS — Z125 Encounter for screening for malignant neoplasm of prostate: Secondary | ICD-10-CM | POA: Diagnosis not present

## 2018-06-16 DIAGNOSIS — E7849 Other hyperlipidemia: Secondary | ICD-10-CM | POA: Diagnosis not present

## 2018-06-16 DIAGNOSIS — Z79899 Other long term (current) drug therapy: Secondary | ICD-10-CM | POA: Diagnosis not present

## 2018-06-17 ENCOUNTER — Telehealth: Payer: Self-pay | Admitting: *Deleted

## 2018-06-17 LAB — BASIC METABOLIC PANEL
BUN/Creatinine Ratio: 19 (ref 10–24)
BUN: 18 mg/dL (ref 8–27)
CALCIUM: 9.5 mg/dL (ref 8.6–10.2)
CHLORIDE: 98 mmol/L (ref 96–106)
CO2: 29 mmol/L (ref 20–29)
Creatinine, Ser: 0.97 mg/dL (ref 0.76–1.27)
GFR calc non Af Amer: 82 mL/min/{1.73_m2} (ref >59)
GFR, EST AFRICAN AMERICAN: 95 mL/min/{1.73_m2} (ref >59)
Glucose: 111 mg/dL — ABNORMAL HIGH (ref 65–99)
POTASSIUM: 5 mmol/L (ref 3.5–5.2)
SODIUM: 138 mmol/L (ref 134–144)

## 2018-06-17 LAB — HEPATITIS C ANTIBODY

## 2018-06-17 LAB — PSA: PROSTATE SPECIFIC AG, SERUM: 1 ng/mL (ref 0.0–4.0)

## 2018-06-17 LAB — HIV ANTIBODY (ROUTINE TESTING W REFLEX): HIV SCREEN 4TH GENERATION: NONREACTIVE

## 2018-06-30 ENCOUNTER — Other Ambulatory Visit: Payer: Self-pay | Admitting: Family Medicine

## 2018-07-18 ENCOUNTER — Telehealth: Payer: Self-pay | Admitting: Family Medicine

## 2018-07-18 NOTE — Telephone Encounter (Signed)
Dawn with  [facility] calling requesting an FL2 filled out. pts sister called her and asked to start the process to get him switched from where he is at now. CB# [phone].

## 2018-07-18 NOTE — Telephone Encounter (Signed)
I called [facility] spoke with Marice Potter asked her to let Tammy and Butch Penny know that we do not have FL2 forms here they will need to fax the [facility] form to [place] on Mr.Desir.She will have them either fax the form or call me back.

## 2018-07-18 NOTE — Telephone Encounter (Signed)
FL2 Form in your box. I filled out what I could.

## 2018-07-18 NOTE — Telephone Encounter (Signed)
[name] pt sister is going to bring the [facility] by the office today for Dr.Scott to sign upon return.

## 2018-07-20 NOTE — Telephone Encounter (Signed)
Please fill in his medication list then at that point the form is completed

## 2018-07-21 NOTE — Telephone Encounter (Signed)
Medication list to be printed and placed with from by medical records.

## 2018-08-12 ENCOUNTER — Telehealth: Payer: Self-pay | Admitting: Family Medicine

## 2018-08-12 ENCOUNTER — Encounter: Payer: Self-pay | Admitting: Family Medicine

## 2018-08-12 ENCOUNTER — Ambulatory Visit (INDEPENDENT_AMBULATORY_CARE_PROVIDER_SITE_OTHER): Payer: Medicare Other | Admitting: Family Medicine

## 2018-08-12 DIAGNOSIS — Z79891 Long term (current) use of opiate analgesic: Secondary | ICD-10-CM

## 2018-08-12 DIAGNOSIS — F411 Generalized anxiety disorder: Secondary | ICD-10-CM | POA: Diagnosis not present

## 2018-08-12 DIAGNOSIS — K219 Gastro-esophageal reflux disease without esophagitis: Secondary | ICD-10-CM

## 2018-08-12 DIAGNOSIS — G8929 Other chronic pain: Secondary | ICD-10-CM

## 2018-08-12 DIAGNOSIS — Z89512 Acquired absence of left leg below knee: Secondary | ICD-10-CM | POA: Diagnosis not present

## 2018-08-12 DIAGNOSIS — G4700 Insomnia, unspecified: Secondary | ICD-10-CM | POA: Diagnosis not present

## 2018-08-12 DIAGNOSIS — M79604 Pain in right leg: Secondary | ICD-10-CM

## 2018-08-12 DIAGNOSIS — Z23 Encounter for immunization: Secondary | ICD-10-CM

## 2018-08-12 DIAGNOSIS — J449 Chronic obstructive pulmonary disease, unspecified: Secondary | ICD-10-CM | POA: Diagnosis not present

## 2018-08-12 MED ORDER — OMEPRAZOLE 40 MG PO CPDR: 40.0000 mg | DELAYED_RELEASE_CAPSULE | Freq: Every day | ORAL | 5 refills | Status: DC

## 2018-08-12 MED ORDER — BUDESONIDE-FORMOTEROL FUMARATE 80-4.5 MCG/ACT IN AERO: INHALATION_SPRAY | RESPIRATORY_TRACT | 5 refills | Status: DC

## 2018-08-12 MED ORDER — ALBUTEROL SULFATE HFA 108 (90 BASE) MCG/ACT IN AERS: 2.0000 | INHALATION_SPRAY | Freq: Four times a day (QID) | RESPIRATORY_TRACT | 5 refills | Status: DC | PRN

## 2018-08-12 MED ORDER — TEMAZEPAM 30 MG PO CAPS: ORAL_CAPSULE | ORAL | 5 refills | Status: DC

## 2018-08-12 MED ORDER — ALPRAZOLAM 1 MG PO TABS: ORAL_TABLET | ORAL | 5 refills | Status: DC

## 2018-08-12 MED ORDER — NORTRIPTYLINE HCL 50 MG PO CAPS: 100.0000 mg | ORAL_CAPSULE | Freq: Every day | ORAL | 5 refills | Status: DC

## 2018-08-12 NOTE — Telephone Encounter (Signed)
Please send a copy of the patient's lab work to them-this is where he is staying

## 2018-08-12 NOTE — Telephone Encounter (Signed)
Butch Penny with [facility] requesting patients lab results be faxed over to [phone].

## 2018-08-12 NOTE — Progress Notes (Signed)
Subjective:    Patient ID: [name], male    DOB: 1951/05/19, 67 y.o.   MRN: [id number]  HPI This patient was seen today for chronic pain. Takes for bilateral leg pain  The medication list was reviewed and updated.   -Compliance with medication: yes  - Number patient states they take daily: three a day  -when was the last dose patient took? today  The patient was advised the importance of maintaining medication and not using illegal substances with these.  Here for refills and follow up  The patient was educated that we can provide 3 monthly scripts for their medication, it is their responsibility to follow the instructions.  Side effects or complications from medications: none  Patient is aware that pain medications are meant to minimize the severity of the pain to allow their pain levels to improve to allow for better function. They are aware of that pain medications cannot totally remove their pain.  Due for UDT ( at least once per year) : due today  Pt is at [facility] now.   Would like a flu vaccine today.   This patient does have COPD.  He uses albuterol as needed.  He does not use any other inhalers other than Symbicort..  He does smoke a few cigarettes per day.  He was counseled to quit smoking.  He was warned about the possibility of cancer.  Reflux under decent control with medication continue medication.  Watch diet closely  Insomnia issues chronic long-term on temazepam as well as alprazolam he may continue these  Patient also has generalized anxiety disorder uses Xanax on a regular basis intermittently but also uses nortriptyline at night and seems to be doing well.  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  Does have chronic pain with this and is on pain medicine.  Review of Systems  Constitutional: Negative for diaphoresis and fatigue.  HENT: Negative for congestion and rhinorrhea.   Respiratory: Negative for cough and shortness of breath.     Cardiovascular: Negative for chest pain and leg swelling.  Gastrointestinal: Negative for abdominal pain and diarrhea.  Musculoskeletal: Positive for arthralgias and back pain.  Skin: Negative for color change and rash.  Neurological: Negative for dizziness and headaches.  Psychiatric/Behavioral: Negative for behavioral problems and confusion.       Objective:   Physical Exam  Constitutional: He appears well-nourished. No distress.  HENT:  Head: Normocephalic and atraumatic.  Eyes: Right eye exhibits no discharge. Left eye exhibits no discharge.  Neck: No tracheal deviation present.  Cardiovascular: Normal rate, regular rhythm and normal heart sounds.  No murmur heard. Pulmonary/Chest: Effort normal and breath sounds normal. No respiratory distress.  Musculoskeletal: He exhibits no edema.  Lymphadenopathy:    He has no cervical adenopathy.  Neurological: He is alert. Coordination normal.  Skin: Skin is warm and dry.  Psychiatric: He has a normal mood and affect. His behavior is normal.  Vitals reviewed.  -I reviewed over the lab work in detail including his lipid PSA kidney function.       Assessment & Plan:  Prostate exam in 3 months Chronic leg pain and discomfort 3 prescription for pain medicine given follow-up in 3 months  COPD patient encouraged to quit smoking continue inhalers continue current measures stay physically active  Reflux good control with medication continue this watch diet  Insomnia tolerates medicine well helps him out he has been on this for years  Generalized anxiety disorder use  Xanax on a regular basis follow-up ongoing trouble.  25 minutes was spent with the patient.  This statement verifies that 25 minutes was indeed spent with the patient.  More than 50% of this visit-total duration of the visit-was spent in counseling and coordination of care. The issues that the patient came in for today as reflected in the diagnosis (s) please refer to  documentation for further details.

## 2018-08-14 ENCOUNTER — Telehealth: Payer: Self-pay | Admitting: *Deleted

## 2018-08-14 NOTE — Telephone Encounter (Signed)
[facility] on alprazolam 1mg  and temazepam 30mg . Submitted through cover my meds and it came up on screen available without authorization. Called [facility] since we got pa request and pharm told me both meds were picked up and it did not need auth.

## 2018-08-17 LAB — TOXASSURE SELECT 13 (MW), URINE

## 2018-08-22 ENCOUNTER — Telehealth: Payer: Self-pay | Admitting: *Deleted

## 2018-08-22 MED ORDER — HYDROCODONE-ACETAMINOPHEN 5-325 MG PO TABS: ORAL_TABLET | ORAL | 0 refills | Status: DC

## 2018-08-22 NOTE — Telephone Encounter (Signed)
Needs refill on hydrocodone. Last seen 08/12/18.

## 2018-08-22 NOTE — Telephone Encounter (Signed)
Sure may write and ref

## 2018-08-22 NOTE — Telephone Encounter (Signed)
Prescription up front for pick up. [facility] notified prescription ready for pick up.

## 2018-08-31 ENCOUNTER — Encounter: Payer: Self-pay | Admitting: Family Medicine

## 2018-09-03 ENCOUNTER — Ambulatory Visit: Payer: Medicare Other | Admitting: Nurse Practitioner

## 2018-09-12 ENCOUNTER — Telehealth: Payer: Self-pay | Admitting: Family Medicine

## 2018-09-12 NOTE — Telephone Encounter (Signed)
This was completed

## 2018-09-12 NOTE — Telephone Encounter (Signed)
[facility] came and picked up paper work.

## 2018-09-12 NOTE — Telephone Encounter (Signed)
[facility] dropped off a paper for Dr. Nicki Reaper to sign off on. It has been placed in providers box in office.

## 2018-09-12 NOTE — Telephone Encounter (Signed)
Called [facility] they are aware it is ready for pick up.

## 2018-09-24 ENCOUNTER — Telehealth: Payer: Self-pay | Admitting: Family Medicine

## 2018-09-24 NOTE — Telephone Encounter (Signed)
Last med check 08/12/18

## 2018-09-24 NOTE — Telephone Encounter (Signed)
Fax from [facility] requesting refill on Hydrocodone 5/325 mg 90 tablets take one tablet by mouth 3 times daily at 8 am. 2 pm and 8 pm. Fax states that facility is requesting refill. Please advise. Thank you

## 2018-09-24 NOTE — Telephone Encounter (Signed)
[facility] pt

## 2018-09-25 ENCOUNTER — Other Ambulatory Visit: Payer: Self-pay | Admitting: Family Medicine

## 2018-09-25 MED ORDER — HYDROCODONE-ACETAMINOPHEN 5-325 MG PO TABS: ORAL_TABLET | ORAL | 0 refills | Status: DC

## 2018-09-25 NOTE — Telephone Encounter (Signed)
Gayle at [facility] contacted to inform that medication was sent in. Called [facility] to see if they would deliver and they will. Gayle notified and verbalized understanding.

## 2018-09-25 NOTE — Telephone Encounter (Signed)
Medication was sent in as requested

## 2018-09-30 ENCOUNTER — Telehealth: Payer: Self-pay | Admitting: *Deleted

## 2018-09-30 NOTE — Telephone Encounter (Signed)
Fax from [facility] stating facility requires indication for use on rx for pain relief 500mg  tablet  #60 one bid prn pain. Generic for tylenol ex strength 500mg  tablet.

## 2018-10-01 ENCOUNTER — Ambulatory Visit (INDEPENDENT_AMBULATORY_CARE_PROVIDER_SITE_OTHER): Payer: Medicare Other | Admitting: Family Medicine

## 2018-10-01 ENCOUNTER — Encounter: Payer: Self-pay | Admitting: Family Medicine

## 2018-10-01 VITALS — BP 112/72 | Temp 98.4°F | Ht 72.0 in | Wt 169.0 lb

## 2018-10-01 DIAGNOSIS — J329 Chronic sinusitis, unspecified: Secondary | ICD-10-CM

## 2018-10-01 DIAGNOSIS — J310 Chronic rhinitis: Secondary | ICD-10-CM

## 2018-10-01 MED ORDER — ALBUTEROL SULFATE HFA 108 (90 BASE) MCG/ACT IN AERS: 2.0000 | INHALATION_SPRAY | Freq: Four times a day (QID) | RESPIRATORY_TRACT | 5 refills | Status: DC | PRN

## 2018-10-01 MED ORDER — AMOXICILLIN 500 MG PO CAPS: 500.0000 mg | ORAL_CAPSULE | Freq: Three times a day (TID) | ORAL | 0 refills | Status: DC

## 2018-10-01 NOTE — Telephone Encounter (Signed)
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

## 2018-10-01 NOTE — Progress Notes (Signed)
   Subjective:    Patient ID: [name], male    DOB: 21-Dec-1950, 67 y.o.   MRN: [id number]  HPI Patient is here today due to what he thinks is the flu. He states he has had a cough,runny nose,headache,sore throat fever no energy since Sunday   Sun morn got to feeling bad  Felt headache and achey and sore throat  And dim energy   Felt mseravble    Coughing up some gunky stuff    Smokes "barely"   . He is taking tylenol.   Review of Systems No headache, no major weight loss or weight gain, no chest pain no back pain abdominal pain no change in bowel habits complete ROS otherwise negative     Objective:   Physical Exam  Alert, mild malaise. Hydration good Vitals stable. frontal/ maxillary tenderness evident positive nasal congestion. pharynx normal neck supple  lungs clear/no crackles or wheezes. heart regular in rhythm       Assessment & Plan:  Impression rhinosinusitis likely post viral, discussed with patient. plan antibiotics prescribed. Questions answered. Symptomatic care discussed. warning signs discussed. WSL

## 2018-10-02 NOTE — Telephone Encounter (Signed)
Pharmacy notified (stated they must have diagnosis for all PRN meds when in assisted living)

## 2018-10-24 ENCOUNTER — Telehealth: Payer: Self-pay | Admitting: Family Medicine

## 2018-10-24 ENCOUNTER — Other Ambulatory Visit: Payer: Self-pay | Admitting: Family Medicine

## 2018-10-24 MED ORDER — HYDROCODONE-ACETAMINOPHEN 5-325 MG PO TABS: ORAL_TABLET | ORAL | 0 refills | Status: DC

## 2018-10-24 NOTE — Telephone Encounter (Signed)
Refill for: HYDROcodone-acetaminophen (NORCO/VICODIN) 5-325 MG tablet    [facility] [place]

## 2018-10-24 NOTE — Telephone Encounter (Signed)
Prescription was sent it electronically

## 2018-10-24 NOTE — Telephone Encounter (Signed)
Please advise. Thank you

## 2018-11-11 ENCOUNTER — Encounter: Payer: Self-pay | Admitting: Family Medicine

## 2018-11-11 ENCOUNTER — Ambulatory Visit (INDEPENDENT_AMBULATORY_CARE_PROVIDER_SITE_OTHER): Payer: Medicare Other | Admitting: Family Medicine

## 2018-11-11 VITALS — BP 112/80 | Temp 98.3°F | Ht 72.0 in | Wt 168.1 lb

## 2018-11-11 DIAGNOSIS — R0609 Other forms of dyspnea: Secondary | ICD-10-CM | POA: Diagnosis not present

## 2018-11-11 DIAGNOSIS — G8929 Other chronic pain: Secondary | ICD-10-CM

## 2018-11-11 DIAGNOSIS — J189 Pneumonia, unspecified organism: Secondary | ICD-10-CM

## 2018-11-11 DIAGNOSIS — Z89512 Acquired absence of left leg below knee: Secondary | ICD-10-CM

## 2018-11-11 DIAGNOSIS — M5440 Lumbago with sciatica, unspecified side: Secondary | ICD-10-CM | POA: Diagnosis not present

## 2018-11-11 DIAGNOSIS — J449 Chronic obstructive pulmonary disease, unspecified: Secondary | ICD-10-CM

## 2018-11-11 DIAGNOSIS — R0600 Dyspnea, unspecified: Secondary | ICD-10-CM

## 2018-11-11 MED ORDER — AMOXICILLIN-POT CLAVULANATE 875-125 MG PO TABS: 1.0000 | ORAL_TABLET | Freq: Two times a day (BID) | ORAL | 0 refills | Status: DC

## 2018-11-11 MED ORDER — ALBUTEROL SULFATE HFA 108 (90 BASE) MCG/ACT IN AERS: 2.0000 | INHALATION_SPRAY | RESPIRATORY_TRACT | 5 refills | Status: DC | PRN

## 2018-11-11 MED ORDER — HYDROCODONE-ACETAMINOPHEN 5-325 MG PO TABS: ORAL_TABLET | ORAL | 0 refills | Status: DC

## 2018-11-11 MED ORDER — ALPRAZOLAM 0.5 MG PO TABS: ORAL_TABLET | ORAL | 5 refills | Status: DC

## 2018-11-11 NOTE — Progress Notes (Signed)
Subjective:    Patient ID: [name], male    DOB: Nov 06, 1951, 67 y.o.   MRN: [id number]  HPI This patient was seen today for chronic pain  The medication list was reviewed and updated.   -Compliance with medication: Yes  - Number patient states they take daily: three per day  -when was the last dose patient took? 8:30 am today  The patient was advised the importance of maintaining medication and not using illegal substances with these.  Here for refills and follow up  The patient was educated that we can provide 3 monthly scripts for their medication, it is their responsibility to follow the instructions Side effects or complications from medications: None  Patient is aware that pain medications are meant to minimize the severity of the pain to allow their pain levels to improve to allow for better function. They are aware of that pain medications cannot totally remove their pain.  Due for UDT ( at least once per year) : 08/13/2019  He also has some head ache and sinus pressure today.   He does relate a little shortness of breath with activity but denies any severe chest tightness pressure pain he is coughing up a lot of yellow-green phlegm occasionally gets tinged with blood he is a smoker He does have COPD uses albuterol on a regular basis alsoUses Symbicort  He does have chronic pain and discomfort uses medication on a regular basis to help him with his back pain as well as where he had his amputation years ago  Review of Systems  Constitutional: Negative for activity change, chills and fever.  HENT: Positive for congestion and rhinorrhea. Negative for ear pain.   Eyes: Negative for discharge.  Respiratory: Positive for cough. Negative for wheezing.   Cardiovascular: Negative for chest pain.  Gastrointestinal: Negative for nausea and vomiting.  Musculoskeletal: Positive for arthralgias and back pain.       Objective:   Physical Exam  Constitutional: He appears  well-developed.  HENT:  Head: Normocephalic.  Mouth/Throat: Oropharynx is clear and moist. No oropharyngeal exudate.  Neck: Normal range of motion.  Cardiovascular: Normal rate, regular rhythm and normal heart sounds.  No murmur heard. Pulmonary/Chest: Effort normal and breath sounds normal. He has no wheezes.  Lymphadenopathy:    He has no cervical adenopathy.  Neurological: He exhibits normal muscle tone.  Skin: Skin is warm and dry.  Nursing note and vitals reviewed.         Assessment & Plan:  Patient coughing up large amounts of yellow phlegm with a little bit of blood mixed in with this I believe this is more related to infection we will do a chest x-ray as well as go ahead with medication recheck him in 1 month  I do not feel he needs a CAT scan just yet but we may well be pursuing this based upon the results of the x-ray  The patient was seen in followup for chronic pain. A review over at their current pain status was discussed. Drug registry was checked. Prescriptions were given. Discussion was held regarding the importance of compliance with medication as well as pain medication contract.  Time for questions regarding pain management plan occurred. Importance of regular followup visits was discussed. Patient was informed that medication may cause drowsiness and should not be combined  with other medications/alcohol or street drugs. Patient was cautioned that medication could cause drowsiness. If the patient feels medication is causing altered alertness then do not  drive or operate dangerous equipment.  Prescriptions were electronically sent  Patient has anxiety and depression issues.  Unable to go to mental health his medications were refilled here We are reducing his Xanax from 1 mg twice a day down to 0.5 mg maximum 3/day  He can still use a sleeping pill at night he has been on this for years  History of amputation doing well with pain medicine  25 minutes was  spent with the patient.  This statement verifies that 25 minutes was indeed spent with the patient.  More than 50% of this visit-total duration of the visit-was spent in counseling and coordination of care. The issues that the patient came in for today as reflected in the diagnosis (s) please refer to documentation for further details.

## 2018-11-18 ENCOUNTER — Ambulatory Visit (HOSPITAL_COMMUNITY)
Admission: RE | Admit: 2018-11-18 | Discharge: 2018-11-18 | Disposition: A | Discharge status: Home / Self Care | Payer: Medicare Other | Source: Ambulatory Visit | Attending: Family Medicine | Admitting: Family Medicine

## 2018-11-18 DIAGNOSIS — J209 Acute bronchitis, unspecified: Secondary | ICD-10-CM | POA: Diagnosis not present

## 2018-11-18 DIAGNOSIS — R0600 Dyspnea, unspecified: Secondary | ICD-10-CM

## 2018-11-18 DIAGNOSIS — R0609 Other forms of dyspnea: Secondary | ICD-10-CM | POA: Diagnosis not present

## 2018-12-11 ENCOUNTER — Ambulatory Visit: Payer: Medicare Other | Admitting: Family Medicine

## 2018-12-12 ENCOUNTER — Telehealth: Payer: Self-pay | Admitting: Family Medicine

## 2018-12-12 NOTE — Telephone Encounter (Signed)
Physicians order sheet dropped off for signature. Placed form in Dr. Bary Leriche office in box.

## 2018-12-14 NOTE — Telephone Encounter (Signed)
This was signed thank you

## 2018-12-15 NOTE — Telephone Encounter (Signed)
Called and left message with [facility] the form was ready for pick up

## 2018-12-16 ENCOUNTER — Encounter: Payer: Self-pay | Admitting: Family Medicine

## 2018-12-16 ENCOUNTER — Ambulatory Visit (INDEPENDENT_AMBULATORY_CARE_PROVIDER_SITE_OTHER): Payer: Medicare Other | Admitting: Family Medicine

## 2018-12-16 VITALS — BP 110/70 | Ht 72.0 in | Wt 176.6 lb

## 2018-12-16 DIAGNOSIS — J449 Chronic obstructive pulmonary disease, unspecified: Secondary | ICD-10-CM | POA: Diagnosis not present

## 2018-12-16 DIAGNOSIS — G8929 Other chronic pain: Secondary | ICD-10-CM | POA: Diagnosis not present

## 2018-12-16 DIAGNOSIS — M5440 Lumbago with sciatica, unspecified side: Secondary | ICD-10-CM | POA: Diagnosis not present

## 2018-12-16 MED ORDER — HYDROCODONE-ACETAMINOPHEN 5-325 MG PO TABS: ORAL_TABLET | ORAL | 0 refills | Status: DC

## 2018-12-16 NOTE — Progress Notes (Signed)
   Subjective:    Patient ID: [name], male    DOB: [birth year], 68 y.o.   MRN: [id number]  HPI Patient arrives for a follow up o a recent cough. Patient states his cough is better and he is barely smoking any more. He does have a history of congestion and coughing and a history of smoking he does state he is trying to watch his diet he is cutting back on smoking he only smokes a couple cigarettes per week and thinks he may well be able to quit in the near future denies any other particular trouble.  Review of Systems  Constitutional: Negative for activity change.  HENT: Negative for congestion and rhinorrhea.   Respiratory: Negative for cough and shortness of breath.   Cardiovascular: Negative for chest pain.  Gastrointestinal: Negative for abdominal pain, diarrhea, nausea and vomiting.  Genitourinary: Negative for dysuria and hematuria.  Neurological: Negative for weakness and headaches.  Psychiatric/Behavioral: Negative for behavioral problems and confusion.       Objective:   Physical Exam Constitutional:      General: He is not in acute distress.    Appearance: He is well-developed.  HENT:     Head: Normocephalic.  Cardiovascular:     Rate and Rhythm: Normal rate and regular rhythm.     Heart sounds: Normal heart sounds. No murmur.  Pulmonary:     Effort: Pulmonary effort is normal.     Breath sounds: Normal breath sounds.  Skin:    General: Skin is warm and dry.  Neurological:     Mental Status: He is alert.  Psychiatric:        Behavior: Behavior normal.           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

## 2018-12-18 ENCOUNTER — Other Ambulatory Visit: Payer: Self-pay | Admitting: Family Medicine

## 2018-12-25 ENCOUNTER — Telehealth: Payer: Self-pay | Admitting: Family Medicine

## 2018-12-25 NOTE — Telephone Encounter (Signed)
It is possible this could be just a simple virus it could be something more if they would like for him to be seen today go ahead with an office visit

## 2018-12-25 NOTE — Telephone Encounter (Signed)
I called [facility] and spoke with Butch Penny, she states she is unaware if the pt has a fever or not. She states he told her that he just did not feel well,scratchy throat and cough.She has no way to bring him in today as transportation has left for the day. Doeshe need an office visit for tomorrow or can we send in medication? [facility] uses [facility]

## 2018-12-25 NOTE — Telephone Encounter (Signed)
I called and spoke with Madaline Savage at [facility]. I asked that pt be seen tomorrow and asked that she please have transportation provided,and transferred her up front to put on the schedule for tomorrow.

## 2018-12-25 NOTE — Telephone Encounter (Signed)
[facility] is calling on behalf of patient having a scratchy throat, headache, and cough. Requesting medication if possible.   Pharmacy:  [facility], [place] - [place]

## 2018-12-26 ENCOUNTER — Encounter: Payer: Self-pay | Admitting: Family Medicine

## 2018-12-26 ENCOUNTER — Ambulatory Visit (INDEPENDENT_AMBULATORY_CARE_PROVIDER_SITE_OTHER): Payer: Medicare Other | Admitting: Family Medicine

## 2018-12-26 VITALS — BP 132/88 | Temp 98.4°F | Wt 175.8 lb

## 2018-12-26 DIAGNOSIS — J189 Pneumonia, unspecified organism: Secondary | ICD-10-CM

## 2018-12-26 DIAGNOSIS — J181 Lobar pneumonia, unspecified organism: Secondary | ICD-10-CM

## 2018-12-26 MED ORDER — CEFTRIAXONE SODIUM 500 MG IJ SOLR
500.0000 mg | Freq: Once | INTRAMUSCULAR | Status: AC
  Administered 2018-12-26: 500 mg via INTRAMUSCULAR

## 2018-12-26 MED ORDER — PREDNISONE 20 MG PO TABS: ORAL_TABLET | ORAL | 0 refills | Status: DC

## 2018-12-26 MED ORDER — AZITHROMYCIN 250 MG PO TABS: ORAL_TABLET | ORAL | 0 refills | Status: DC

## 2018-12-26 NOTE — Progress Notes (Signed)
   Subjective:    Patient ID: [name], male    DOB: 01-24-1951, 68 y.o.   MRN: [id number]  Cough  This is a new problem. The current episode started in the past 7 days. Associated symptoms include headaches, rhinorrhea and sweats. Pertinent negatives include no chest pain, chills, ear pain, fever or wheezing. Associated symptoms comments: Decreased appetite, oj doing day and white cranberry at night, fatigued, sleepy, headache radiates to neck and shoulders. . Treatments tried: cough drops. The treatment provided mild relief.  Patient with viral-like illness multiple days now with coughing congestion a little bit of chills denies wheezing difficulty breathing PMH benign COPD    Review of Systems  Constitutional: Negative for activity change, chills and fever.  HENT: Positive for congestion and rhinorrhea. Negative for ear pain.   Eyes: Negative for discharge.  Respiratory: Positive for cough. Negative for wheezing.   Cardiovascular: Negative for chest pain.  Gastrointestinal: Negative for nausea and vomiting.  Musculoskeletal: Negative for arthralgias.  Neurological: Positive for headaches.       Objective:   Physical Exam Vitals signs and nursing note reviewed.  Constitutional:      Appearance: He is well-developed.  HENT:     Head: Normocephalic.     Mouth/Throat:     Pharynx: No oropharyngeal exudate.  Neck:     Musculoskeletal: Normal range of motion.  Cardiovascular:     Rate and Rhythm: Normal rate and regular rhythm.     Heart sounds: Normal heart sounds. No murmur.  Pulmonary:     Effort: Pulmonary effort is normal.     Breath sounds: Rhonchi present. No wheezing.  Lymphadenopathy:     Cervical: No cervical adenopathy.  Skin:    General: Skin is warm and dry.  Neurological:     Motor: No abnormal muscle tone.    O2 saturation 90 not respiratory distress  Right lower lobe pneumonia detected     Assessment & Plan:  Right lower lobe pneumonia Rocephin  500 Z-Pak as directed follow-up in several days for recheck follow-up sooner if worse warning signs were discussed also talked with his sister via phone if he gets worse over the weekend to go to the ER

## 2018-12-29 ENCOUNTER — Ambulatory Visit: Payer: Medicare Other | Admitting: Family Medicine

## 2019-01-15 ENCOUNTER — Other Ambulatory Visit: Payer: Self-pay | Admitting: Family Medicine

## 2019-01-29 ENCOUNTER — Other Ambulatory Visit: Payer: Self-pay | Admitting: Family Medicine

## 2019-03-17 ENCOUNTER — Ambulatory Visit: Payer: Medicare Other | Admitting: Family Medicine

## 2019-03-17 ENCOUNTER — Encounter: Payer: Self-pay | Admitting: Family Medicine

## 2019-03-17 ENCOUNTER — Other Ambulatory Visit: Payer: Self-pay | Admitting: Family Medicine

## 2019-03-17 ENCOUNTER — Other Ambulatory Visit: Payer: Self-pay

## 2019-03-17 ENCOUNTER — Telehealth: Payer: Self-pay | Admitting: *Deleted

## 2019-03-17 ENCOUNTER — Ambulatory Visit (INDEPENDENT_AMBULATORY_CARE_PROVIDER_SITE_OTHER): Payer: Medicare Other | Admitting: Family Medicine

## 2019-03-17 VITALS — Wt 180.0 lb

## 2019-03-17 DIAGNOSIS — G4700 Insomnia, unspecified: Secondary | ICD-10-CM | POA: Diagnosis not present

## 2019-03-17 DIAGNOSIS — J449 Chronic obstructive pulmonary disease, unspecified: Secondary | ICD-10-CM | POA: Diagnosis not present

## 2019-03-17 DIAGNOSIS — Z89512 Acquired absence of left leg below knee: Secondary | ICD-10-CM | POA: Diagnosis not present

## 2019-03-17 DIAGNOSIS — M5440 Lumbago with sciatica, unspecified side: Secondary | ICD-10-CM

## 2019-03-17 DIAGNOSIS — G8929 Other chronic pain: Secondary | ICD-10-CM

## 2019-03-17 DIAGNOSIS — F411 Generalized anxiety disorder: Secondary | ICD-10-CM

## 2019-03-17 MED ORDER — ALPRAZOLAM 0.5 MG PO TABS: ORAL_TABLET | ORAL | 5 refills | Status: DC

## 2019-03-17 MED ORDER — HYDROCODONE-ACETAMINOPHEN 5-325 MG PO TABS: ORAL_TABLET | ORAL | 0 refills | Status: DC

## 2019-03-17 MED ORDER — TEMAZEPAM 30 MG PO CAPS: ORAL_CAPSULE | ORAL | 5 refills | Status: DC

## 2019-03-17 MED ORDER — TEMAZEPAM 15 MG PO CAPS: ORAL_CAPSULE | ORAL | 5 refills | Status: DC

## 2019-03-17 NOTE — Telephone Encounter (Signed)
Pharm calling about script that was sent in today for temazepam 30mg . Pharm states he was decreased to 15mg  back in February. I looked under med history and only see where 30mg  was prescribed. Pharm states they received a note from [facility] to decrease med. She states she will fax over note to office so dr Nicki Reaper can review. Will place in your folder once we get it.

## 2019-03-17 NOTE — Progress Notes (Signed)
   Subjective:    Patient ID: [name], male    DOB: 09-May-1951, 68 y.o.   MRN: [id number] Telephone visit Video not available by patient Coronavirus outbreak   HPI This patient was seen today for chronic pain  The medication list was reviewed and updated.   -Compliance with medication: Hydrocodone 5-325  - Number patient states they take daily: 3  -when was the last dose patient took? About 5 minutes ago   The patient was advised the importance of maintaining medication and not using illegal substances with these.  Here for refills and follow up  The patient was educated that we can provide 3 monthly scripts for their medication, it is their responsibility to follow the instructions.  Side effects or complications from medications: none  Patient is aware that pain medications are meant to minimize the severity of the pain to allow their pain levels to improve to allow for better function. They are aware of that pain medications cannot totally remove their pain. Patient does relate the pain medicine necessary to help him function and helps him with his amputation as well as back pain denies abusing the medicine Due for UDT ( at least once per year) :   Virtual Visit via Telephone Note  I connected with [name] on 03/17/19 at  9:30 AM EDT by telephone and verified that I am speaking with the correct person using two identifiers.   I discussed the limitations, risks, security and privacy concerns of performing an evaluation and management service by telephone and the availability of in person appointments. I also discussed with the patient that there may be a patient responsible charge related to this service. The patient expressed understanding and agreed to proceed.   History of Present Illness:    Observations/Objective:   Assessment and Plan:   Follow Up Instructions:    I discussed the assessment and treatment plan with the patient. The patient was  provided an opportunity to ask questions and all were answered. The patient agreed with the plan and demonstrated an understanding of the instructions.   The patient was advised to call back or seek an in-person evaluation if the symptoms worsen or if the condition fails to improve as anticipated.  I provided 15 minutes of non-face-to-face time during this encounter.   Vicente Males, LPN      Review of Systems     Objective:   Physical Exam        Assessment & Plan:  Patient does have an underlying anxiety denies being depressed uses his medicines as prescribed  Has chronic insomnia has been on medicine for years and is safely Sleep continue medicine  Chronic pain and discomfort from his amputation and back he does not abuse medicine drug registry was checked 3 scripts were sent in  COPD patient staying away from smoking.  States breathing doing okay he is at high risk of coronavirus we discussed ways to help protect against this He is currently on a rest home.  Follow-up 3 months no lab work currently

## 2019-03-17 NOTE — Telephone Encounter (Signed)
15 mg is the correct dose I did send this in as a corrected prescription just now to [facility] Somehow when we made the change it was never reflected in the [facility]

## 2019-04-16 DIAGNOSIS — B351 Tinea unguium: Secondary | ICD-10-CM | POA: Diagnosis not present

## 2019-04-16 DIAGNOSIS — M79675 Pain in left toe(s): Secondary | ICD-10-CM | POA: Diagnosis not present

## 2019-04-21 ENCOUNTER — Telehealth: Payer: Self-pay | Admitting: Family Medicine

## 2019-04-21 NOTE — Telephone Encounter (Signed)
[name] from [facility] says she has faxed a form over twice regarding his Xanax and needs that form signed and sent back as soon as possible.

## 2019-04-21 NOTE — Telephone Encounter (Signed)
Dr Nicki Reaper I looked for form and could not find it. If you do not have it in your office I will call [facility] and have them resend it.

## 2019-04-22 NOTE — Telephone Encounter (Signed)
[name] resent form and it is in dr scott's folder. Awaiting his review.

## 2019-04-22 NOTE — Telephone Encounter (Signed)
Form was completed please fax back

## 2019-04-22 NOTE — Telephone Encounter (Signed)
[name] from [facility] calling checking on form for pt

## 2019-04-22 NOTE — Telephone Encounter (Signed)
Contacted Donna at [facility] and asked her to resend the copy. Butch Penny states that the form is a pharmacy review.

## 2019-06-19 ENCOUNTER — Other Ambulatory Visit: Payer: Self-pay

## 2019-06-19 DIAGNOSIS — H25813 Combined forms of age-related cataract, bilateral: Secondary | ICD-10-CM | POA: Diagnosis not present

## 2019-06-23 ENCOUNTER — Ambulatory Visit (INDEPENDENT_AMBULATORY_CARE_PROVIDER_SITE_OTHER): Payer: Medicare Other | Admitting: Family Medicine

## 2019-06-23 ENCOUNTER — Other Ambulatory Visit: Payer: Self-pay

## 2019-06-23 DIAGNOSIS — Z89512 Acquired absence of left leg below knee: Secondary | ICD-10-CM

## 2019-06-23 DIAGNOSIS — G8929 Other chronic pain: Secondary | ICD-10-CM

## 2019-06-23 DIAGNOSIS — M5440 Lumbago with sciatica, unspecified side: Secondary | ICD-10-CM

## 2019-06-23 DIAGNOSIS — E785 Hyperlipidemia, unspecified: Secondary | ICD-10-CM

## 2019-06-23 DIAGNOSIS — Z125 Encounter for screening for malignant neoplasm of prostate: Secondary | ICD-10-CM | POA: Diagnosis not present

## 2019-06-23 DIAGNOSIS — G4700 Insomnia, unspecified: Secondary | ICD-10-CM

## 2019-06-23 DIAGNOSIS — J449 Chronic obstructive pulmonary disease, unspecified: Secondary | ICD-10-CM

## 2019-06-23 DIAGNOSIS — Z79899 Other long term (current) drug therapy: Secondary | ICD-10-CM | POA: Diagnosis not present

## 2019-06-23 MED ORDER — ALBUTEROL SULFATE HFA 108 (90 BASE) MCG/ACT IN AERS: 2.0000 | INHALATION_SPRAY | RESPIRATORY_TRACT | 6 refills | Status: DC | PRN

## 2019-06-23 MED ORDER — NORTRIPTYLINE HCL 50 MG PO CAPS: 100.0000 mg | ORAL_CAPSULE | Freq: Every day | ORAL | 6 refills | Status: DC

## 2019-06-23 MED ORDER — HYDROCODONE-ACETAMINOPHEN 5-325 MG PO TABS: ORAL_TABLET | ORAL | 0 refills | Status: DC

## 2019-06-23 MED ORDER — OMEPRAZOLE 40 MG PO CPDR: 40.0000 mg | DELAYED_RELEASE_CAPSULE | Freq: Every day | ORAL | 6 refills | Status: DC

## 2019-06-23 MED ORDER — BUDESONIDE-FORMOTEROL FUMARATE 80-4.5 MCG/ACT IN AERO: INHALATION_SPRAY | RESPIRATORY_TRACT | 6 refills | Status: DC

## 2019-06-23 NOTE — Addendum Note (Signed)
Addended by: Sallee Lange A on: 06/23/2019 01:20 PM   Modules accepted: Orders

## 2019-06-23 NOTE — Progress Notes (Signed)
Subjective:    Patient ID: [name], male    DOB: [birth year], 68 y.o.   MRN: [id number] Telephone only HPI  This patient was seen today for chronic pain  The medication list was reviewed and updated.   -Compliance with medication: yes  - Number patient states they take daily: 3  -when was the last dose patient took? today  The patient was advised the importance of maintaining medication and not using illegal substances with these.  Here for refills and follow up  The patient was educated that we can provide 3 monthly scripts for their medication, it is their responsibility to follow the instructions.  Side effects or complications from medications: none  Patient is aware that pain medications are meant to minimize the severity of the pain to allow their pain levels to improve to allow for better function. They are aware of that pain medications cannot totally remove their pain.  Patient has a headache but basically the same.  Virtual Visit via Video Note  I connected with [name] on 06/23/19 at 10:30 AM EDT by a video enabled telemedicine application and verified that I am speaking with the correct person using two identifiers.  Location: Patient: home Provider: office   I discussed the limitations of evaluation and management by telemedicine and the availability of in person appointments. The patient expressed understanding and agreed to proceed.  History of Present Illness:    Observations/Objective:   Assessment and Plan:   Follow Up Instructions:    I discussed the assessment and treatment plan with the patient. The patient was provided an opportunity to ask questions and all were answered. The patient agreed with the plan and demonstrated an understanding of the instructions.   The patient was advised to call back or seek an in-person evaluation if the symptoms worsen or if the condition fails to improve as anticipated.  I provided 18 minutes of  non-face-to-face time during this encounter.          Review of Systems  Constitutional: Negative for activity change.  HENT: Negative for congestion and rhinorrhea.   Respiratory: Negative for cough and shortness of breath.   Cardiovascular: Negative for chest pain.  Gastrointestinal: Negative for abdominal pain, diarrhea, nausea and vomiting.  Genitourinary: Negative for dysuria and hematuria.  Neurological: Negative for weakness and headaches.  Psychiatric/Behavioral: Negative for behavioral problems and confusion.       Objective:   Physical Exam  Today's visit was via telephone Physical exam was not possible for this visit       Assessment & Plan:  The patient was seen in followup for chronic pain. A review over at their current pain status was discussed. Drug registry was checked. Prescriptions were given. Discussion was held regarding the importance of compliance with medication as well as pain medication contract.  Time for questions regarding pain management plan occurred. Importance of regular followup visits was discussed. Patient was informed that medication may cause drowsiness and should not be combined  with other medications/alcohol or street drugs. Patient was cautioned that medication could cause drowsiness. If the patient feels medication is causing altered alertness then do not drive or operate dangerous equipment.  1. Insomnia, unspecified type Patient has chronic insomnia and has been on medication for years it has helped him I think stopping the medication would actually be detrimental to his health continue currently as is  2. Chronic bilateral low back pain with sciatica, sciatica laterality unspecified Patient does  have chronic pain please see per above he uses his medication responsibly 3 additional prescriptions were sent into his pharmacy drug registry was checked  3. History of left below knee amputation ([place]) He states the pain level is  under reasonable control he is able to utilize his prosthesis without trouble  4. COPD mixed type [facility]) He does have underlying COPD.  Patient has been counseled to stay away from smoking but he still smokes some  5. Hyperlipidemia, unspecified hyperlipidemia type Patient needs to do lipid profile.  Watch diet continue medications - Lipid panel  6. Screening for prostate cancer Screening PSA ordered - PSA  7. High risk medication use Because medications high risk additional labs ordered - Hepatic function panel  Follow-up in 3 months - Basic metabolic panel

## 2019-07-09 ENCOUNTER — Telehealth: Payer: Self-pay | Admitting: Family Medicine

## 2019-07-09 NOTE — Telephone Encounter (Signed)
[facility] dropped off a FL2 - please complete & call when done [phone]  In nurses forms box

## 2019-07-10 NOTE — Telephone Encounter (Signed)
This form was completed thank you

## 2019-07-10 NOTE — Telephone Encounter (Signed)
[facility] PICKED UP FORMS

## 2019-09-01 DIAGNOSIS — M79675 Pain in left toe(s): Secondary | ICD-10-CM | POA: Diagnosis not present

## 2019-09-01 DIAGNOSIS — B351 Tinea unguium: Secondary | ICD-10-CM | POA: Diagnosis not present

## 2019-09-22 ENCOUNTER — Other Ambulatory Visit: Payer: Self-pay | Admitting: Family Medicine

## 2019-09-22 NOTE — Telephone Encounter (Signed)
I will go ahead and send in the medicine patient does need to do at the very least a virtual follow-up within the next 30 days

## 2019-09-23 NOTE — Telephone Encounter (Signed)
Please call and schedule appt. Thanks

## 2019-10-08 ENCOUNTER — Other Ambulatory Visit: Payer: Self-pay | Admitting: Family Medicine

## 2019-10-19 ENCOUNTER — Other Ambulatory Visit: Payer: Self-pay | Admitting: Family Medicine

## 2019-10-19 NOTE — Telephone Encounter (Signed)
Needs to do a follow-up visit this can be virtual patient at [facility]

## 2019-10-20 NOTE — Telephone Encounter (Signed)
Please contact patient to set up appt. Thank you

## 2019-10-20 NOTE — Telephone Encounter (Signed)
No answer

## 2019-10-20 NOTE — Telephone Encounter (Signed)
Pt is scheduled for 12/16

## 2019-10-28 ENCOUNTER — Other Ambulatory Visit: Payer: Self-pay

## 2019-11-03 DIAGNOSIS — M79675 Pain in left toe(s): Secondary | ICD-10-CM | POA: Diagnosis not present

## 2019-11-03 DIAGNOSIS — B351 Tinea unguium: Secondary | ICD-10-CM | POA: Diagnosis not present

## 2019-11-05 DIAGNOSIS — U071 COVID-19: Secondary | ICD-10-CM | POA: Diagnosis not present

## 2019-11-05 DIAGNOSIS — Z20828 Contact with and (suspected) exposure to other viral communicable diseases: Secondary | ICD-10-CM | POA: Diagnosis not present

## 2019-11-09 DIAGNOSIS — Z20828 Contact with and (suspected) exposure to other viral communicable diseases: Secondary | ICD-10-CM | POA: Diagnosis not present

## 2019-11-09 DIAGNOSIS — U071 COVID-19: Secondary | ICD-10-CM | POA: Diagnosis not present

## 2019-11-18 ENCOUNTER — Ambulatory Visit: Payer: Medicare Other | Admitting: Family Medicine

## 2019-11-19 ENCOUNTER — Ambulatory Visit (INDEPENDENT_AMBULATORY_CARE_PROVIDER_SITE_OTHER): Payer: Medicare Other | Admitting: Family Medicine

## 2019-11-19 ENCOUNTER — Other Ambulatory Visit: Payer: Self-pay | Admitting: Family Medicine

## 2019-11-19 DIAGNOSIS — J449 Chronic obstructive pulmonary disease, unspecified: Secondary | ICD-10-CM | POA: Diagnosis not present

## 2019-11-19 DIAGNOSIS — M5440 Lumbago with sciatica, unspecified side: Secondary | ICD-10-CM

## 2019-11-19 DIAGNOSIS — G4700 Insomnia, unspecified: Secondary | ICD-10-CM | POA: Diagnosis not present

## 2019-11-19 DIAGNOSIS — U071 COVID-19: Secondary | ICD-10-CM

## 2019-11-19 DIAGNOSIS — F411 Generalized anxiety disorder: Secondary | ICD-10-CM | POA: Diagnosis not present

## 2019-11-19 DIAGNOSIS — G8929 Other chronic pain: Secondary | ICD-10-CM

## 2019-11-19 MED ORDER — BUDESONIDE-FORMOTEROL FUMARATE 80-4.5 MCG/ACT IN AERO: INHALATION_SPRAY | RESPIRATORY_TRACT | 5 refills | Status: DC

## 2019-11-19 MED ORDER — NORTRIPTYLINE HCL 50 MG PO CAPS: 100.0000 mg | ORAL_CAPSULE | Freq: Every day | ORAL | 5 refills | Status: DC

## 2019-11-19 MED ORDER — OMEPRAZOLE 40 MG PO CPDR: 40.0000 mg | DELAYED_RELEASE_CAPSULE | Freq: Every day | ORAL | 5 refills | Status: DC

## 2019-11-19 MED ORDER — HYDROCODONE-ACETAMINOPHEN 5-325 MG PO TABS: ORAL_TABLET | ORAL | 0 refills | Status: DC

## 2019-11-19 MED ORDER — TEMAZEPAM 15 MG PO CAPS: ORAL_CAPSULE | ORAL | 5 refills | Status: DC

## 2019-11-19 MED ORDER — ALBUTEROL SULFATE HFA 108 (90 BASE) MCG/ACT IN AERS: 2.0000 | INHALATION_SPRAY | RESPIRATORY_TRACT | 5 refills | Status: DC | PRN

## 2019-11-19 NOTE — Progress Notes (Signed)
Subjective:  Virtual visit via video  Patient ID: [name], male    DOB: September 14, 1951, 68 y.o.   MRN: [id number] Virtual Visit via Video Note  I connected with Vinod Mikesell Pippen on 11/19/19 at  9:30 AM EST by a video enabled telemedicine application and verified that I am speaking with the correct person using two identifiers.  Location: Patient: [facility] Provider: Office   I discussed the limitations of evaluation and management by telemedicine and the availability of in person appointments. The patient expressed understanding and agreed to proceed.  History of Present Illness:    Observations/Objective:   Assessment and Plan:   Follow Up Instructions:    I discussed the assessment and treatment plan with the patient. The patient was provided an opportunity to ask questions and all were answered. The patient agreed with the plan and demonstrated an understanding of the instructions.   The patient was advised to call back or seek an in-person evaluation if the symptoms worsen or if the condition fails to improve as anticipated.  I provided 25 minutes of non-face-to-face time during this encounter.   Sallee Lange, MD   HPI This patient was seen today for chronic pain  The medication list was reviewed and updated.   -Compliance with medication: yes  - Number patient states they take daily: takes 3 a day  -when was the last dose patient took? today  The patient was advised the importance of maintaining medication and not using illegal substances with these.  Here for refills and follow up  The patient was educated that we can provide 3 monthly scripts for their medication, it is their responsibility to follow the instructions.  Side effects or complications from medications:   Patient is aware that pain medications are meant to minimize the severity of the pain to allow their pain levels to improve to allow for better function. They are aware of that pain  medications cannot totally remove their pain.  Due for UDT ( at least once per year) : last one 08/12/18. Doing a virtual visit today.   Tested positive for covid and having dizziness and weakness.   Review of Systems  Constitutional: Positive for fatigue. Negative for activity change, chills and fever.  HENT: Positive for congestion and rhinorrhea. Negative for ear pain.   Eyes: Negative for discharge.  Respiratory: Positive for cough. Negative for wheezing.   Cardiovascular: Negative for chest pain.  Gastrointestinal: Negative for nausea and vomiting.  Musculoskeletal: Negative for arthralgias.  Neurological: Positive for dizziness and headaches.       Objective:   Physical Exam   Patient had virtual visit Appears to be in no distress Atraumatic Neuro able to relate and oriented No apparent resp distress Color normal      Assessment & Plan:  Covid infection Supportive measures discussed Patient not short of breath currently We will check in with patient on a regular basis warning signs were discussed in detail  The patient was seen in followup for chronic pain. A review over at their current pain status was discussed. Drug registry was checked. Prescriptions were given. Discussion was held regarding the importance of compliance with medication as well as pain medication contract.  Time for questions regarding pain management plan occurred. Importance of regular followup visits was discussed. Patient was informed that medication may cause drowsiness and should not be combined  with other medications/alcohol or street drugs. Patient was cautioned that medication could cause drowsiness. If the patient  feels medication is causing altered alertness then do not drive or operate dangerous equipment.  Drug registry checked 3 scripts written  Chronic insomnia refills of medicine given  Chronic anxiety refills of medicine given  COPD stable currently

## 2019-11-30 ENCOUNTER — Ambulatory Visit (INDEPENDENT_AMBULATORY_CARE_PROVIDER_SITE_OTHER)
Admission: EM | Admit: 2019-11-30 | Discharge: 2019-11-30 | Disposition: A | Discharge status: Home / Self Care | Payer: Medicare Other | Source: Home / Self Care

## 2019-11-30 ENCOUNTER — Telehealth: Payer: Self-pay | Admitting: *Deleted

## 2019-11-30 ENCOUNTER — Other Ambulatory Visit: Payer: Self-pay

## 2019-11-30 ENCOUNTER — Ambulatory Visit (INDEPENDENT_AMBULATORY_CARE_PROVIDER_SITE_OTHER): Payer: Medicare Other

## 2019-11-30 DIAGNOSIS — J1289 Other viral pneumonia: Secondary | ICD-10-CM | POA: Diagnosis not present

## 2019-11-30 DIAGNOSIS — U071 COVID-19: Secondary | ICD-10-CM

## 2019-11-30 DIAGNOSIS — R0602 Shortness of breath: Secondary | ICD-10-CM | POA: Diagnosis not present

## 2019-11-30 DIAGNOSIS — R05 Cough: Secondary | ICD-10-CM | POA: Diagnosis not present

## 2019-11-30 DIAGNOSIS — J1282 Pneumonia due to coronavirus disease 2019: Secondary | ICD-10-CM

## 2019-11-30 HISTORY — DX: Chronic obstructive pulmonary disease, unspecified: J44.9

## 2019-11-30 NOTE — ED Triage Notes (Addendum)
Pt presents to UC w/ c/o sob starting today. Pt is a smoker and tested positive for covid. Hx of COPD.

## 2019-11-30 NOTE — ED Provider Notes (Signed)
[facility]    CSN: [id number] Arrival date & time: 11/30/19  1659      History   Chief Complaint Chief Complaint  Patient presents with  . positive covid/sob    HPI [name] is a 68 y.o. male.   [name] 68y presented to the urgent care for complaint of shortness of breath for the past 1 day.  Patient tested positive for COVID-19 couple days ago. denies sick exposure to COVID, flu or strep.  Denies recent travel.  Denies aggravating or alleviating symptoms.  Denies previous COVID infection.   Denies fever, chills, fatigue, nasal congestion, rhinorrhea, sore throat, cough, SOB, wheezing, chest pain, nausea, vomiting, changes in bowel or bladder habits.     The history is provided by the patient.    Past Medical History:  Diagnosis Date  . Anxiety disorder   . Chronic insomnia   . Chronic low back pain   . COPD (chronic obstructive pulmonary disease) ([place])   . GERD (gastroesophageal reflux disease) 08/29/2017  . Hx of right BKA ([place])    Secondary to trauma  . Hyperlipidemia     Patient Active Problem List   Diagnosis Date Noted  . COPD mixed type ([place]) 02/02/2018  . GERD (gastroesophageal reflux disease) 08/29/2017  . Chronic leg pain 12/15/2015  . History of left below knee amputation ([place]) 08/31/2015  . SVT (supraventricular tachycardia) ([place]) 04/30/2015  . Seizure ([place]) 04/29/2015  . Hyperlipidemia 02/18/2014  . Chronic back pain 07/10/2013  . Insomnia 07/10/2013  . Generalized anxiety disorder 07/10/2013    History reviewed. No pertinent surgical history.     Home Medications    Prior to Admission medications   Medication Sig Start Date End Date Taking? Authorizing Provider  acetaminophen (MAPAP) 500 MG tablet One po BID prn 02/17/18   Luking, Elayne Snare, MD  albuterol (VENTOLIN HFA) 108 (90 Base) MCG/ACT inhaler Inhale 2 puffs into the lungs every 4 (four) hours as needed for wheezing. 11/19/19   [name], MD  ALPRAZolam Duanne Moron)  0.5 MG tablet 1 at 8am, 1 at 2pm, 1qhs 03/17/19   Luking, Elayne Snare, MD  budesonide-formoterol (SYMBICORT) 80-4.5 MCG/ACT inhaler INHALE 2 PUFFS INTO LUNGS TWICE DAILY. RINSE MOUTH AFTER USE TO PREVENT THRUSH. 11/19/19   [name], MD  HYDROcodone-acetaminophen (NORCO/VICODIN) 5-325 MG tablet TAKE 1 TABLET BY MOUTH 3 TIMES DAILY AT 8am, 2pm & 8pm. 10/19/19   [name], MD  HYDROcodone-acetaminophen (NORCO/VICODIN) 5-325 MG tablet Take 1 tablet by mouth at 8 am, 1 tablet by mouth at 2pm, and 1 tablet by mouth at 8pm, 11/19/19   Luking, Elayne Snare, MD  HYDROcodone-acetaminophen (NORCO/VICODIN) 5-325 MG tablet Take 1 tablet by mouth at 8 am, 1 tablet by mouth at 2pm, and 1 tablet by mouth at 8pm, 11/19/19   Luking, Scott A, MD  HYDROcodone-acetaminophen (NORCO/VICODIN) 5-325 MG tablet TAKE 1 TABLET BY MOUTH 3 TIMES DAILY AT 8am, 2pm & 8pm. 11/19/19   [name], MD  nortriptyline (PAMELOR) 50 MG capsule Take 2 capsules (100 mg total) by mouth at bedtime. 11/19/19   [name], MD  omeprazole (PRILOSEC) 40 MG capsule Take 1 capsule (40 mg total) by mouth daily. 11/19/19   [name], MD  temazepam (RESTORIL) 15 MG capsule TAKE (1) CAPSULE BY MOUTH AT BEDTIME. 11/19/19   [name], MD    Family History Family History  Problem Relation Age of Onset  . Healthy Mother   .  Healthy Father     Social History Social History   Tobacco Use  . Smoking status: Light Tobacco Smoker    Types: Cigarettes  . Smokeless tobacco: Never Used  Substance Use Topics  . Alcohol use: No  . Drug use: Not on file     Allergies   Asa [aspirin] and Benadryl [diphenhydramine hcl]   Review of Systems Review of Systems  Constitutional: Negative.   HENT: Negative.   Respiratory: Positive for shortness of breath.   Cardiovascular: Negative.   Gastrointestinal: Negative.   Neurological: Negative.   ROS: All other are negatives  Physical Exam Triage Vital Signs ED Triage Vitals    Enc Vitals Group     BP 11/30/19 1716 117/77     Pulse Rate 11/30/19 1716 (!) 115     Resp 11/30/19 1716 18     Temp 11/30/19 1716 98.4 F (36.9 C)     Temp Source 11/30/19 1716 Oral     SpO2 11/30/19 1716 (!) 86 %     Weight --      Height --      Head Circumference --      Peak Flow --      Pain Score 11/30/19 1752 0     Pain Loc --      Pain Edu? --      Excl. in [place]? --    No data found.  Updated Vital Signs BP 117/77 (BP Location: Right Arm)   Pulse (!) 115   Temp 98.4 F (36.9 C) (Oral)   Resp 18   SpO2 (!) 86%   Visual Acuity Right Eye Distance:   Left Eye Distance:   Bilateral Distance:    Right Eye Near:   Left Eye Near:    Bilateral Near:     Physical Exam Constitutional:      General: He is not in acute distress.    Appearance: Normal appearance. He is normal weight. He is not ill-appearing or toxic-appearing.  HENT:     Head: Normocephalic.     Right Ear: Tympanic membrane, ear canal and external ear normal. There is no impacted cerumen.     Left Ear: Ear canal and external ear normal. There is no impacted cerumen.     Nose: Nose normal. No congestion.     Mouth/Throat:     Mouth: Mucous membranes are moist.     Pharynx: No oropharyngeal exudate or posterior oropharyngeal erythema.  Cardiovascular:     Rate and Rhythm: Normal rate and regular rhythm.     Pulses: Normal pulses.     Heart sounds: Normal heart sounds. No murmur.  Pulmonary:     Effort: Pulmonary effort is normal. No respiratory distress.     Breath sounds: No wheezing or rhonchi.  Chest:     Chest wall: No tenderness.  Abdominal:     General: Abdomen is flat. Bowel sounds are normal. There is no distension.     Palpations: There is no mass.  Skin:    Capillary Refill: Capillary refill takes less than 2 seconds.  Neurological:     Mental Status: He is alert and oriented to person, place, and time.      UC Treatments / Results  Labs (all labs ordered are listed, but only  abnormal results are displayed) Labs Reviewed - No data to display  EKG   Radiology DG Chest 2 View  Result Date: 11/30/2019 CLINICAL DATA:  Patient has cough, sob, abdominal pain and nausea  x 3 days. Positive CoVID test. Current smoker, COPD. shortness of breath EXAM: CHEST - 2 VIEW COMPARISON:  Radiograph 11/18/2018 FINDINGS: Normal cardiac silhouette. There is patchy airspace densities along the RIGHT lateral chest wall. Lateral projection place disease in the RIGHT middle lobe. Lungs are hyperinflated. No acute osseous findings. IMPRESSION: Nodular densities in the periphery of the RIGHT middle lobe. Differential would include bronchopneumonia, viral pneumonia (COVID) or pulmonary neoplasm. Favor COVID pneumonia in light of positive COVID test. Recommend follow-up radiographs to ensure resolution and exclude neoplasm. Electronically Signed   By: Suzy Bouchard M.D.   On: 11/30/2019 17:47    Procedures Procedures (including critical care time)  Medications Ordered in UC Medications - No data to display  Initial Impression / Assessment and Plan / UC Course  I have reviewed the triage vital signs and the nursing notes.  Pertinent labs & imaging results that were available during my care of the patient were reviewed by me and considered in my medical decision making (see chart for details).   X-rays show possible viral pneumonia.  Advised patient to go to ED for worsening of symptoms.  Patient was advised to follow-up with primary care for possible radiography to ensure resolution and exclusion of neoplasm.  Patient verbalized understanding of the plan of care.  Final Clinical Impressions(s) / UC Diagnoses   Final diagnoses:  Shortness of breath     Discharge Instructions     Go to ED if symptom get worse X-ray showed possible viral pneumonia Advised patient to schedule an appointment with for follow-up radiograph which when showing resolution and exclusion of  neoplasm. Advised patient to follow-up with primary care Advised patient to continue to use albuterol as prescribed To go to ED for further evaluation if symptoms worsen     ED Prescriptions    None     PDMP not reviewed this encounter.   Emerson Monte, [place] 11/30/19 1836

## 2019-11-30 NOTE — Discharge Instructions (Signed)
Go to ED if symptom get worse X-ray showed possible viral pneumonia Advised patient to schedule an appointment with for follow-up radiograph which when showing resolution and exclusion of neoplasm. Advised patient to follow-up with primary care Advised patient to continue to use albuterol as prescribed To go to ED for further evaluation if symptoms worsen

## 2019-11-30 NOTE — Telephone Encounter (Signed)
Tammy from [facility] calling because pt was positive for covid on the 16th and has been having weakness and having some sob, congestion and a rattle in his chest. Consult with dr Nicki Reaper and advised him to go to urgent care on [address] or ED. Tammy states they will take him to urgent care [address]. I gave her their number and I called urgent care to let them know pt was coming over and is positive for covid.

## 2019-12-01 ENCOUNTER — Ambulatory Visit (INDEPENDENT_AMBULATORY_CARE_PROVIDER_SITE_OTHER): Payer: Medicare Other | Admitting: Family Medicine

## 2019-12-01 ENCOUNTER — Encounter (HOSPITAL_COMMUNITY): Payer: Self-pay | Admitting: Emergency Medicine

## 2019-12-01 ENCOUNTER — Inpatient Hospital Stay (HOSPITAL_COMMUNITY)
Admission: EM | Admit: 2019-12-01 | Discharge: 2019-12-04 | DRG: 189 | Disposition: A | Discharge status: Skilled Nursing Facility | Payer: Medicare Other | Source: Skilled Nursing Facility | Attending: Internal Medicine | Admitting: Internal Medicine

## 2019-12-01 ENCOUNTER — Emergency Department (HOSPITAL_COMMUNITY): Payer: Medicare Other

## 2019-12-01 ENCOUNTER — Other Ambulatory Visit: Payer: Self-pay

## 2019-12-01 DIAGNOSIS — Z7951 Long term (current) use of inhaled steroids: Secondary | ICD-10-CM

## 2019-12-01 DIAGNOSIS — Z888 Allergy status to other drugs, medicaments and biological substances status: Secondary | ICD-10-CM

## 2019-12-01 DIAGNOSIS — M5440 Lumbago with sciatica, unspecified side: Secondary | ICD-10-CM | POA: Diagnosis not present

## 2019-12-01 DIAGNOSIS — J9601 Acute respiratory failure with hypoxia: Principal | ICD-10-CM

## 2019-12-01 DIAGNOSIS — K219 Gastro-esophageal reflux disease without esophagitis: Secondary | ICD-10-CM | POA: Diagnosis present

## 2019-12-01 DIAGNOSIS — U071 COVID-19: Secondary | ICD-10-CM

## 2019-12-01 DIAGNOSIS — M549 Dorsalgia, unspecified: Secondary | ICD-10-CM | POA: Diagnosis present

## 2019-12-01 DIAGNOSIS — J1289 Other viral pneumonia: Secondary | ICD-10-CM | POA: Diagnosis not present

## 2019-12-01 DIAGNOSIS — Z89512 Acquired absence of left leg below knee: Secondary | ICD-10-CM | POA: Diagnosis not present

## 2019-12-01 DIAGNOSIS — J441 Chronic obstructive pulmonary disease with (acute) exacerbation: Secondary | ICD-10-CM | POA: Diagnosis not present

## 2019-12-01 DIAGNOSIS — Z79899 Other long term (current) drug therapy: Secondary | ICD-10-CM

## 2019-12-01 DIAGNOSIS — I471 Supraventricular tachycardia: Secondary | ICD-10-CM | POA: Diagnosis not present

## 2019-12-01 DIAGNOSIS — R569 Unspecified convulsions: Secondary | ICD-10-CM | POA: Diagnosis present

## 2019-12-01 DIAGNOSIS — J440 Chronic obstructive pulmonary disease with acute lower respiratory infection: Secondary | ICD-10-CM | POA: Diagnosis present

## 2019-12-01 DIAGNOSIS — Z89511 Acquired absence of right leg below knee: Secondary | ICD-10-CM

## 2019-12-01 DIAGNOSIS — G8929 Other chronic pain: Secondary | ICD-10-CM | POA: Diagnosis not present

## 2019-12-01 DIAGNOSIS — J9600 Acute respiratory failure, unspecified whether with hypoxia or hypercapnia: Secondary | ICD-10-CM | POA: Diagnosis present

## 2019-12-01 DIAGNOSIS — R0602 Shortness of breath: Secondary | ICD-10-CM | POA: Diagnosis not present

## 2019-12-01 DIAGNOSIS — E785 Hyperlipidemia, unspecified: Secondary | ICD-10-CM | POA: Diagnosis present

## 2019-12-01 DIAGNOSIS — J449 Chronic obstructive pulmonary disease, unspecified: Secondary | ICD-10-CM

## 2019-12-01 DIAGNOSIS — Z8616 Personal history of COVID-19: Secondary | ICD-10-CM

## 2019-12-01 DIAGNOSIS — J1282 Pneumonia due to coronavirus disease 2019: Secondary | ICD-10-CM | POA: Diagnosis not present

## 2019-12-01 DIAGNOSIS — F17210 Nicotine dependence, cigarettes, uncomplicated: Secondary | ICD-10-CM | POA: Diagnosis present

## 2019-12-01 DIAGNOSIS — F419 Anxiety disorder, unspecified: Secondary | ICD-10-CM | POA: Diagnosis present

## 2019-12-01 DIAGNOSIS — Z9981 Dependence on supplemental oxygen: Secondary | ICD-10-CM

## 2019-12-01 DIAGNOSIS — Z886 Allergy status to analgesic agent status: Secondary | ICD-10-CM

## 2019-12-01 LAB — COMPREHENSIVE METABOLIC PANEL
ALT: 28 U/L (ref 0–44)
AST: 23 U/L (ref 15–41)
Albumin: 3.4 g/dL — ABNORMAL LOW (ref 3.5–5.0)
Alkaline Phosphatase: 128 U/L — ABNORMAL HIGH (ref 38–126)
Anion gap: 10 (ref 5–15)
BUN: 8 mg/dL (ref 8–23)
CO2: 29 mmol/L (ref 22–32)
Calcium: 9.1 mg/dL (ref 8.9–10.3)
Chloride: 100 mmol/L (ref 98–111)
Creatinine, Ser: 0.74 mg/dL (ref 0.61–1.24)
GFR calc Af Amer: >60 mL/min (ref >60)
GFR calc non Af Amer: >60 mL/min (ref >60)
Glucose, Bld: 99 mg/dL (ref 70–99)
Potassium: 4.1 mmol/L (ref 3.5–5.1)
Sodium: 139 mmol/L (ref 135–145)
Total Bilirubin: 0.8 mg/dL (ref 0.3–1.2)
Total Protein: 7.8 g/dL (ref 6.5–8.1)

## 2019-12-01 LAB — TRIGLYCERIDES: Triglycerides: 243 mg/dL — ABNORMAL HIGH (ref <150)

## 2019-12-01 LAB — CBC WITH DIFFERENTIAL/PLATELET
Abs Immature Granulocytes: 0.01 10*3/uL (ref 0.00–0.07)
Basophils Absolute: 0 10*3/uL (ref 0.0–0.1)
Basophils Relative: 0 %
Eosinophils Absolute: 0.2 10*3/uL (ref 0.0–0.5)
Eosinophils Relative: 3 %
HCT: 43.2 % (ref 39.0–52.0)
Hemoglobin: 14.5 g/dL (ref 13.0–17.0)
Immature Granulocytes: 0 %
Lymphocytes Relative: 39 %
Lymphs Abs: 2.7 10*3/uL (ref 0.7–4.0)
MCH: 32 pg (ref 26.0–34.0)
MCHC: 33.6 g/dL (ref 30.0–36.0)
MCV: 95.4 fL (ref 80.0–100.0)
Monocytes Absolute: 0.8 10*3/uL (ref 0.1–1.0)
Monocytes Relative: 12 %
Neutro Abs: 3.1 10*3/uL (ref 1.7–7.7)
Neutrophils Relative %: 46 %
Platelets: 400 10*3/uL (ref 150–400)
RBC: 4.53 MIL/uL (ref 4.22–5.81)
RDW: 12.4 % (ref 11.5–15.5)
WBC: 6.8 10*3/uL (ref 4.0–10.5)
nRBC: 0 % (ref 0.0–0.2)

## 2019-12-01 LAB — C-REACTIVE PROTEIN: CRP: 5.7 mg/dL — ABNORMAL HIGH (ref <1.0)

## 2019-12-01 LAB — PROCALCITONIN
Procalcitonin: <0.1 ng/mL
Procalcitonin: <0.1 ng/mL

## 2019-12-01 LAB — FERRITIN: Ferritin: 305 ng/mL (ref 24–336)

## 2019-12-01 LAB — D-DIMER, QUANTITATIVE: D-Dimer, Quant: 1.4 ug/mL-FEU — ABNORMAL HIGH (ref 0.00–0.50)

## 2019-12-01 LAB — LACTIC ACID, PLASMA: Lactic Acid, Venous: 0.8 mmol/L (ref 0.5–1.9)

## 2019-12-01 LAB — FIBRINOGEN: Fibrinogen: 651 mg/dL — ABNORMAL HIGH (ref 210–475)

## 2019-12-01 LAB — LACTATE DEHYDROGENASE: LDH: 141 U/L (ref 98–192)

## 2019-12-01 LAB — ABO/RH: ABO/RH(D): O POS

## 2019-12-01 MED ORDER — ASPIRIN EC 81 MG PO TBEC
81.0000 mg | DELAYED_RELEASE_TABLET | Freq: Every day | ORAL | Status: DC
  Administered 2019-12-02 – 2019-12-03 (×2): 81 mg via ORAL
  Filled 2019-12-01 (×2): qty 1

## 2019-12-01 MED ORDER — NORTRIPTYLINE HCL 25 MG PO CAPS
100.0000 mg | ORAL_CAPSULE | Freq: Every day | ORAL | Status: DC
  Administered 2019-12-02: 100 mg via ORAL
  Filled 2019-12-01 (×5): qty 4

## 2019-12-01 MED ORDER — ALPRAZOLAM 0.5 MG PO TABS
0.5000 mg | ORAL_TABLET | Freq: Three times a day (TID) | ORAL | Status: DC | PRN
  Administered 2019-12-01 – 2019-12-02 (×2): 0.5 mg via ORAL
  Filled 2019-12-01 (×2): qty 1

## 2019-12-01 MED ORDER — ALBUTEROL SULFATE HFA 108 (90 BASE) MCG/ACT IN AERS
2.0000 | INHALATION_SPRAY | Freq: Once | RESPIRATORY_TRACT | Status: AC
  Administered 2019-12-01: 2 via RESPIRATORY_TRACT
  Filled 2019-12-01: qty 6.7

## 2019-12-01 MED ORDER — ZINC SULFATE 220 (50 ZN) MG PO CAPS
220.0000 mg | ORAL_CAPSULE | Freq: Every day | ORAL | Status: DC
  Administered 2019-12-02 – 2019-12-03 (×2): 220 mg via ORAL
  Filled 2019-12-01 (×2): qty 1

## 2019-12-01 MED ORDER — HYDRALAZINE HCL 20 MG/ML IJ SOLN: 5.0000 mg | INTRAMUSCULAR | Status: DC | PRN

## 2019-12-01 MED ORDER — ASCORBIC ACID 500 MG PO TABS
500.0000 mg | ORAL_TABLET | Freq: Every day | ORAL | Status: DC
  Administered 2019-12-02 – 2019-12-03 (×2): 500 mg via ORAL
  Filled 2019-12-01 (×2): qty 1

## 2019-12-01 MED ORDER — ONDANSETRON HCL 4 MG PO TABS: 4.0000 mg | ORAL_TABLET | Freq: Four times a day (QID) | ORAL | Status: DC | PRN

## 2019-12-01 MED ORDER — DOXYCYCLINE HYCLATE 100 MG PO TABS
100.0000 mg | ORAL_TABLET | Freq: Two times a day (BID) | ORAL | Status: DC
  Administered 2019-12-02 (×2): 100 mg via ORAL
  Filled 2019-12-01 (×2): qty 1

## 2019-12-01 MED ORDER — MOMETASONE FURO-FORMOTEROL FUM 100-5 MCG/ACT IN AERO
2.0000 | INHALATION_SPRAY | Freq: Two times a day (BID) | RESPIRATORY_TRACT | Status: DC
  Administered 2019-12-02 – 2019-12-03 (×4): 2 via RESPIRATORY_TRACT
  Filled 2019-12-01 (×2): qty 8.8

## 2019-12-01 MED ORDER — IPRATROPIUM-ALBUTEROL 0.5-2.5 (3) MG/3ML IN SOLN: 3.0000 mL | Freq: Four times a day (QID) | RESPIRATORY_TRACT | Status: DC

## 2019-12-01 MED ORDER — METHYLPREDNISOLONE SODIUM SUCC 125 MG IJ SOLR
60.0000 mg | Freq: Four times a day (QID) | INTRAMUSCULAR | Status: DC
  Administered 2019-12-02 (×2): 60 mg via INTRAVENOUS
  Filled 2019-12-01 (×3): qty 2

## 2019-12-01 MED ORDER — HYDROCODONE-ACETAMINOPHEN 5-325 MG PO TABS
1.0000 | ORAL_TABLET | Freq: Three times a day (TID) | ORAL | Status: DC | PRN
  Administered 2019-12-01 – 2019-12-02 (×2): 1 via ORAL
  Filled 2019-12-01 (×2): qty 1

## 2019-12-01 MED ORDER — ENOXAPARIN SODIUM 40 MG/0.4ML ~~LOC~~ SOLN
40.0000 mg | SUBCUTANEOUS | Status: DC
  Administered 2019-12-02 (×2): 40 mg via SUBCUTANEOUS
  Filled 2019-12-01 (×2): qty 0.4

## 2019-12-01 MED ORDER — PANTOPRAZOLE SODIUM 40 MG PO TBEC
40.0000 mg | DELAYED_RELEASE_TABLET | Freq: Every day | ORAL | Status: DC
  Administered 2019-12-01 – 2019-12-03 (×3): 40 mg via ORAL
  Filled 2019-12-01 (×3): qty 1

## 2019-12-01 MED ORDER — ONDANSETRON HCL 4 MG/2ML IJ SOLN: 4.0000 mg | Freq: Four times a day (QID) | INTRAMUSCULAR | Status: DC | PRN

## 2019-12-01 NOTE — ED Provider Notes (Addendum)
This patient is a 68 year old male, sent from [facility] because of coughing and congestion noted to have positive Covid test a couple of weeks ago.  He has had oxygen saturations down as low as 88%, he is requiring 2 L which he normally does not wear.  On my exam he does have significant restrictive lung disease with severe expiratory wheezing, prolonged expiratory phase, decreased air movement but also has rales at the bases right greater than left.  He has no edema of the left leg, he has a right lower extremity below the knee amputation, he has a soft nontender abdomen and otherwise appears to be in only mild respiratory distress.  He is able to speak in just short and sentences.  He is oxygenating well on 2 L by nasal cannula, he is afebrile, he is normotensive.  I have personally viewed the patient's anterior portable chest x-ray which shows bilateral infiltrates at the bases.  Normal mediastinum, no pneumothorax, no subdiaphragmatic air, normal skin and soft tissues.   EKG Interpretation  Date/Time:  Tuesday December 01 2019 16:57:03 EST Ventricular Rate:  103 PR Interval:  124 QRS Duration: 90 QT Interval:  340 QTC Calculation: 445 R Axis:   81 Text Interpretation: Sinus tachycardia Otherwise normal ECG Confirmed by Noemi Chapel [phone]) on 12/01/2019 5:36:03 PM       Kevan Ny was evaluated in Emergency Department on 12/02/2019 for the symptoms described in the history of present illness. He was evaluated in the context of the global COVID-19 pandemic, which necessitated consideration that the patient might be at risk for infection with the SARS-CoV-2 virus that causes COVID-19. Institutional protocols and algorithms that pertain to the evaluation of patients at risk for COVID-19 are in a state of rapid change based on information released by regulatory bodies including the CDC and federal and state organizations. These policies and algorithms were followed during the patient's care in  the ED.   Medical screening examination/treatment/procedure(s) were conducted as a shared visit with non-physician practitioner(s) and myself.  I personally evaluated the patient during the encounter.  Clinical Impression:   Final diagnoses:  Acute respiratory failure with hypoxia ([place])  Pneumonia due to COVID-19 virus         Noemi Chapel, MD 12/02/19 1012    Noemi Chapel, MD 12/02/19 1014

## 2019-12-01 NOTE — ED Provider Notes (Signed)
[facility] EMERGENCY DEPARTMENT Provider Note   CSN: [id number] Arrival date & time: 12/01/19  1510     History Chief Complaint  Patient presents with  . Cough    [name] is a 68 y.o. male with a history of COPD, GERD, SVT and seizure disorder presenting from a local nursing home with increased shortness of breath and weakness after being tested positive for Covid on December 13.  He was seen at a local urgent care center yesterday for similar symptoms at which he states the chest x-ray completed there was normal, he had oxygen saturations of 88% during that visit.  He continues to be short of breath and had a phone visit with his PCP today who sent him here for further evaluation.  Upon presentation his oxygen saturation was hovering in the 89 to 91% range, improved with nasal cannula 2 L.  He reports generalized fatigue.  He does have COPD, denies any worsening of wheezing, does have some shortness of breath and cough has been productive of a green sputum.  He denies nausea, vomiting, abdominal pain or chest pain.  He takes a low-dose Symbicort every morning, albuterol as needed.  Reports poor appetite and increasing weakness, shortness of breath is worse with exertion.  No chest pain.   Cough Associated symptoms: shortness of breath   Associated symptoms: no chest pain and no fever        Past Medical History:  Diagnosis Date  . Anxiety disorder   . Chronic insomnia   . Chronic low back pain   . COPD (chronic obstructive pulmonary disease) ([place])   . GERD (gastroesophageal reflux disease) 08/29/2017  . 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 Hyperlipidemia     Patient Active Problem List   Diagnosis Date Noted  . COPD mixed type ([place]) 02/02/2018  . GERD (gastroesophageal reflux disease) 08/29/2017  . Chronic leg pain 12/15/2015  . History of left below knee amputation ([place]) 08/31/2015  . SVT (supraventricular tachycardia) ([place]) 04/30/2015  . Seizure ([place])  04/29/2015  . Hyperlipidemia 02/18/2014  . Chronic back pain 07/10/2013  . Insomnia 07/10/2013  . Generalized anxiety disorder 07/10/2013    History reviewed. No pertinent surgical history.     Family History  Problem Relation Age of Onset  . Healthy Mother   . Healthy Father     Social History   Tobacco Use  . Smoking status: Light Tobacco Smoker    Types: Cigarettes  . Smokeless tobacco: Never Used  Substance Use Topics  . Alcohol use: No  . Drug use: Not on file    Home Medications Prior to Admission medications   Medication Sig Start Date End Date Taking? Authorizing Provider  acetaminophen (MAPAP) 500 MG tablet One po BID prn 02/17/18   Luking, Elayne Snare, MD  albuterol (VENTOLIN HFA) 108 (90 Base) MCG/ACT inhaler Inhale 2 puffs into the lungs every 4 (four) hours as needed for wheezing. 11/19/19   [name], MD  ALPRAZolam Duanne Moron) 0.5 MG tablet 1 at 8am, 1 at 2pm, 1qhs 03/17/19   Luking, Elayne Snare, MD  budesonide-formoterol (SYMBICORT) 80-4.5 MCG/ACT inhaler INHALE 2 PUFFS INTO LUNGS TWICE DAILY. RINSE MOUTH AFTER USE TO PREVENT THRUSH. 11/19/19   [name], MD  HYDROcodone-acetaminophen (NORCO/VICODIN) 5-325 MG tablet TAKE 1 TABLET BY MOUTH 3 TIMES DAILY AT 8am, 2pm & 8pm. 10/19/19   [name], MD  HYDROcodone-acetaminophen (NORCO/VICODIN) 5-325 MG tablet Take 1 tablet  by mouth at 8 am, 1 tablet by mouth at 2pm, and 1 tablet by mouth at 8pm, 11/19/19   Luking, Elayne Snare, MD  HYDROcodone-acetaminophen (NORCO/VICODIN) 5-325 MG tablet Take 1 tablet by mouth at 8 am, 1 tablet by mouth at 2pm, and 1 tablet by mouth at 8pm, 11/19/19   Luking, Scott A, MD  HYDROcodone-acetaminophen (NORCO/VICODIN) 5-325 MG tablet TAKE 1 TABLET BY MOUTH 3 TIMES DAILY AT 8am, 2pm & 8pm. 11/19/19   [name], MD  nortriptyline (PAMELOR) 50 MG capsule Take 2 capsules (100 mg total) by mouth at bedtime. 11/19/19   [name], MD  omeprazole (PRILOSEC) 40 MG capsule Take 1  capsule (40 mg total) by mouth daily. 11/19/19   [name], MD  temazepam (RESTORIL) 15 MG capsule TAKE (1) CAPSULE BY MOUTH AT BEDTIME. 11/19/19   [name], MD    Allergies    Asa [aspirin] and Benadryl [diphenhydramine hcl]  Review of Systems   Review of Systems  Constitutional: Positive for fatigue. Negative for fever.  Respiratory: Positive for cough, chest tightness and shortness of breath.   Cardiovascular: Negative for chest pain, palpitations and leg swelling.  Gastrointestinal: Negative.   Genitourinary: Negative.   Neurological: Positive for weakness.    Physical Exam Updated Vital Signs BP 111/75   Pulse 93   Temp 98.4 F (36.9 C) (Oral)   Resp (!) 23   Ht 5\' 11"  (1.803 m)   Wt 81.6 kg   SpO2 95%   BMI 25.10 kg/m   Physical Exam Vitals and nursing note reviewed.  Constitutional:      Appearance: He is well-developed.  HENT:     Head: Normocephalic and atraumatic.  Eyes:     Conjunctiva/sclera: Conjunctivae normal.  Cardiovascular:     Rate and Rhythm: Normal rate and regular rhythm.     Heart sounds: Normal heart sounds.  Pulmonary:     Effort: Pulmonary effort is normal.     Breath sounds: No stridor. Wheezing and rales present.     Comments: Rales right base.  Expiratory wheeze left upper lung field. No accessory muscle use.  Abdominal:     General: Bowel sounds are normal.     Palpations: Abdomen is soft.     Tenderness: There is no abdominal tenderness.  Musculoskeletal:        General: Normal range of motion.     Cervical back: Normal range of motion.     Left lower leg: No edema.     Comments: Right bka  Skin:    General: Skin is warm and dry.  Neurological:     Mental Status: He is alert.     ED Results / Procedures / Treatments   Labs (all labs ordered are listed, but only abnormal results are displayed) Labs Reviewed  COMPREHENSIVE METABOLIC PANEL - Abnormal; Notable for the following components:      Result Value    Albumin 3.4 (*)    Alkaline Phosphatase 128 (*)    All other components within normal limits  D-DIMER, QUANTITATIVE (NOT AT [facility]) - Abnormal; Notable for the following components:   D-Dimer, Quant 1.40 (*)    All other components within normal limits  TRIGLYCERIDES - Abnormal; Notable for the following components:   Triglycerides 243 (*)    All other components within normal limits  FIBRINOGEN - Abnormal; Notable for the following components:   Fibrinogen 651 (*)    All other components within normal limits  C-REACTIVE PROTEIN - Abnormal; Notable for the following components:   CRP 5.7 (*)    All other components within normal limits  CULTURE, BLOOD (ROUTINE X 2)  CULTURE, BLOOD (ROUTINE X 2)  LACTIC ACID, PLASMA  CBC WITH DIFFERENTIAL/PLATELET  PROCALCITONIN  LACTATE DEHYDROGENASE  FERRITIN  LACTIC ACID, PLASMA    EKG EKG Interpretation  Date/Time:  Tuesday December 01 2019 16:57:03 EST Ventricular Rate:  103 PR Interval:  124 QRS Duration: 90 QT Interval:  340 QTC Calculation: 445 R Axis:   81 Text Interpretation: Sinus tachycardia Otherwise normal ECG Confirmed by Noemi Chapel [phone]) on 12/01/2019 5:36:03 PM   Radiology DG Chest 2 View  Result Date: 11/30/2019 CLINICAL DATA:  Patient has cough, sob, abdominal pain and nausea x 3 days. Positive CoVID test. Current smoker, COPD. shortness of breath EXAM: CHEST - 2 VIEW COMPARISON:  Radiograph 11/18/2018 FINDINGS: Normal cardiac silhouette. There is patchy airspace densities along the RIGHT lateral chest wall. Lateral projection place disease in the RIGHT middle lobe. Lungs are hyperinflated. No acute osseous findings. IMPRESSION: Nodular densities in the periphery of the RIGHT middle lobe. Differential would include bronchopneumonia, viral pneumonia (COVID) or pulmonary neoplasm. Favor COVID pneumonia in light of positive COVID test. Recommend follow-up radiographs to ensure resolution and exclude neoplasm. Electronically  Signed   By: Suzy Bouchard M.D.   On: 11/30/2019 17:47   DG Chest Port 1 View  Result Date: 12/01/2019 CLINICAL DATA:  Cough and shortness of breath COVID-19 virus infection EXAM: PORTABLE CHEST 1 VIEW COMPARISON:  11/30/2019 FINDINGS: The heart size and mediastinal contours are within normal limits. Patchy airspace disease is seen in the peripheral lung bases bilaterally, with mild worsening since previous study. Pulmonary emphysema also noted. No evidence of pleural effusion. IMPRESSION: 1. Mild worsening of peripheral bibasilar airspace disease since prior study. 2. Emphysema. Electronically Signed   By: Marlaine Hind M.D.   On: 12/01/2019 18:59    Procedures Procedures (including critical care time)  Medications Ordered in ED Medications  albuterol (VENTOLIN HFA) 108 (90 Base) MCG/ACT inhaler 2 puff (2 puffs Inhalation Given 12/01/19 1927)    ED Course  I have reviewed the triage vital signs and the nursing notes.  Pertinent labs & imaging results that were available during my care of the patient were reviewed by me and considered in my medical decision making (see chart for details).    MDM Rules/Calculators/A&P                      Pt covid positive per nursing home testing 12/13, now with increased sob and hypoxia.  He will benefit from admission for supportive care and tx of his covid 19 sx.  CRITICAL CARE Performed by: Evalee Jefferson Total critical care time: 40 minutes Critical care time was exclusive of separately billable procedures and treating other patients. Critical care was necessary to treat or prevent imminent or life-threatening deterioration. Critical care was time spent personally by me on the following activities: development of treatment plan with patient and/or surrogate as well as nursing, discussions with consultants, evaluation of patient's response to treatment, examination of patient, obtaining history from patient or surrogate, ordering and performing  treatments and interventions, ordering and review of laboratory studies, ordering and review of radiographic studies, pulse oximetry and re-evaluation of patient's condition.  Final Clinical Impression(s) / ED Diagnoses Final diagnoses:  Acute respiratory failure with hypoxia ([place])  Pneumonia due to COVID-19 virus    Rx / DC  Orders ED Discharge Orders    None       Landis Martins 12/01/19 2121    Noemi Chapel, MD 12/02/19 [phone]

## 2019-12-01 NOTE — Progress Notes (Signed)
   Subjective:    Patient ID: [name], male    DOB: Jul 29, 1951, 68 y.o.   MRN: [id number]  HPIpt tested positive for covid. Went to urgent care yesterday. States he is feeling weak today and having some rattling in his chest.  O2 today 90.  This patient tested positive at his rest home over a week ago.  Initially he started off with just some mild body aches and not feeling well but then recently he has developed chest congestion coughing and starting to feel short of breath Yesterday went to the urgent care at that time his O2 sat was 88% also chest x-ray showed increased infiltrate evidence of early Covid pneumonia patient was sent home and states today he feels short of breath when he moves around in addition to this relates chest congestion coughing denies vomiting diarrhea no high fevers today PMH COPD Virtual Visit via Video Note  I connected with AULTON ROUTT on 12/01/19 at  1:10 PM EST by a video enabled telemedicine application and verified that I am speaking with the correct person using two identifiers.  Location: Patient: home Provider: office   I discussed the limitations of evaluation and management by telemedicine and the availability of in person appointments. The patient expressed understanding and agreed to proceed.  History of Present Illness:    Observations/Objective:   Assessment and Plan:   Follow Up Instructions:    I discussed the assessment and treatment plan with the patient. The patient was provided an opportunity to ask questions and all were answered. The patient agreed with the plan and demonstrated an understanding of the instructions.   The patient was advised to call back or seek an in-person evaluation if the symptoms worsen or if the condition fails to improve as anticipated.  I provided 17 minutes of non-face-to-face time during this encounter.    X-ray was reviewed from the urgent care    Review of Systems  Constitutional: Negative  for activity change, chills and fever.  HENT: Positive for congestion and rhinorrhea. Negative for ear pain.   Eyes: Negative for discharge.  Respiratory: Positive for cough. Negative for wheezing.   Cardiovascular: Negative for chest pain.  Gastrointestinal: Negative for nausea and vomiting.  Musculoskeletal: Negative for arthralgias.       Objective:   Physical Exam Patient had virtual visit Appears to to feel ill based on what I am seeing on the video Atraumatic Neuro able to relate and oriented It is difficult to assess his respiratory status but he does not seem to be an extremis Color normal  We did call ahead to triage       Assessment & Plan:  Oxygen saturations in the urgent care were 88% Currently 90% but patient tachypneic per staff at [facility] also patient gets short of breath with minimal activity With his underlying COPD as well as age he needs further evaluation in the ER he is at higher risk of a more severe case of Covid More than likely will need x-rays and lab work to look at inflammatory markers May need antiviral and prednisone/admission depending on the results of this evaluation at the ER  Also face-to-face evaluation done via video We will go ahead and order nursing and physical therapy for when he returns back to [facility]

## 2019-12-01 NOTE — H&P (Signed)
History and Physical    [name] FYB:[id number] DOB: 11-13-1951 DOA: 12/01/2019  PCP: [name], MD Patient coming from: SNF  Chief Complaint: SOB  HPI: [name] is a 68 y.o. male with medical history significant of COPD, GERD, BKA, HLD, ANxiety. Pt sent ot ED today due ton going and worsening SOB. Pt Dx w/ COVID on 11/15/19. O2 saturations prior to admission noted to be 88% on RA. No home O2 requirement. No fever, CP but endorses worsening SOB, cough and wheezing.   ED Course: objectiv efindings below  Review of Systems: As per HPI otherwise all other systems reviewed and are negative  Ambulatory Status: R BKA  Past Medical History:  Diagnosis Date  . Anxiety disorder   . Chronic insomnia   . Chronic low back pain   . COPD (chronic obstructive pulmonary disease) ([place])   . GERD (gastroesophageal reflux disease) 08/29/2017  . 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 Hyperlipidemia     History reviewed. No pertinent surgical history.  Social History   Socioeconomic History  . Marital status: Divorced    Spouse name: Not on file  . Number of children: Not on file  . Years of education: Not on file  . Highest education level: Not on file  Occupational History  . Not on file  Tobacco Use  . Smoking status: Light Tobacco Smoker    Types: Cigarettes  . Smokeless tobacco: Never Used  Substance and Sexual Activity  . Alcohol use: No  . Drug use: Not on file  . Sexual activity: Not Currently  Other Topics Concern  . Not on file  Social History Narrative  . Not on file   Social Determinants of Health   Financial Resource Strain:   . Difficulty of Paying Living Expenses: Not on file  Food Insecurity:   . Worried About Charity fundraiser in the Last Year: Not on file  . Ran Out of Food in the Last Year: Not on file  Transportation Needs:   . Lack of Transportation (Medical): Not on file  . Lack of Transportation (Non-Medical): Not on file    Physical Activity:   . Days of Exercise per Week: Not on file  . Minutes of Exercise per Session: Not on file  Stress:   . Feeling of Stress : Not on file  Social Connections:   . Frequency of Communication with Friends and Family: Not on file  . Frequency of Social Gatherings with Friends and Family: Not on file  . Attends Religious Services: Not on file  . Active Member of Clubs or Organizations: Not on file  . Attends Archivist Meetings: Not on file  . Marital Status: Not on file  Intimate Partner Violence:   . Fear of Current or Ex-Partner: Not on file  . Emotionally Abused: Not on file  . Physically Abused: Not on file  . Sexually Abused: Not on file    Allergies  Allergen Reactions  . Asa [Aspirin] Other (See Comments)    Intolerance to aspirin due to ulcer  . Benadryl [Diphenhydramine Hcl] Other (See Comments)    Nervousness, insomnia    Family History  Problem Relation Age of Onset  . Healthy Mother   . Healthy Father     Denies significant medical conditions inparents.   Prior to Admission medications   Medication Sig Start Date End Date Taking? Authorizing Provider  acetaminophen (MAPAP) 500 MG tablet  One po BID prn 02/17/18   [name], MD  albuterol (VENTOLIN HFA) 108 (90 Base) MCG/ACT inhaler Inhale 2 puffs into the lungs every 4 (four) hours as needed for wheezing. 11/19/19   [name], MD  ALPRAZolam Duanne Moron) 0.5 MG tablet 1 at 8am, 1 at 2pm, 1qhs 03/17/19   Luking, Elayne Snare, MD  budesonide-formoterol (SYMBICORT) 80-4.5 MCG/ACT inhaler INHALE 2 PUFFS INTO LUNGS TWICE DAILY. RINSE MOUTH AFTER USE TO PREVENT THRUSH. 11/19/19   [name], MD  HYDROcodone-acetaminophen (NORCO/VICODIN) 5-325 MG tablet TAKE 1 TABLET BY MOUTH 3 TIMES DAILY AT 8am, 2pm & 8pm. 10/19/19   [name], MD  HYDROcodone-acetaminophen (NORCO/VICODIN) 5-325 MG tablet Take 1 tablet by mouth at 8 am, 1 tablet by mouth at 2pm, and 1 tablet by mouth at 8pm, 11/19/19    Luking, Elayne Snare, MD  HYDROcodone-acetaminophen (NORCO/VICODIN) 5-325 MG tablet Take 1 tablet by mouth at 8 am, 1 tablet by mouth at 2pm, and 1 tablet by mouth at 8pm, 11/19/19   Luking, Scott A, MD  HYDROcodone-acetaminophen (NORCO/VICODIN) 5-325 MG tablet TAKE 1 TABLET BY MOUTH 3 TIMES DAILY AT 8am, 2pm & 8pm. 11/19/19   [name], MD  nortriptyline (PAMELOR) 50 MG capsule Take 2 capsules (100 mg total) by mouth at bedtime. 11/19/19   [name], MD  omeprazole (PRILOSEC) 40 MG capsule Take 1 capsule (40 mg total) by mouth daily. 11/19/19   [name], MD  temazepam (RESTORIL) 15 MG capsule TAKE (1) CAPSULE BY MOUTH AT BEDTIME. 11/19/19   [name], MD    Physical Exam: Vitals:   12/01/19 1930 12/01/19 2000 12/01/19 2029 12/01/19 2030  BP: 110/80 114/78  116/76  Pulse:  95 (!) 102   Resp: (!) 23 (!) 26 19   Temp:      TempSrc:      SpO2:  90% 92%   Weight:      Height:         General:  Appears agitated and elderly , NAD Eyes:  PERRL, EOMI, normal lids, iris ENT:  grossly normal hearing, lips & tongue, mmm Neck:  no LAD, masses or thyromegaly Cardiovascular:  RRR, no m/r/g. No LE edema.  Respiratory: Diffuse wheezing and diminshed breath sounds throughout.  Abdomen:  soft, ntnd, NABS Skin:  no rash or induration seen on limited exam Musculoskeletal:  grossly normal tone BUE/BLE, good ROM,  Psychiatric:  grossly normal mood and affect, speech fluent and appropriate, AOx3 Neurologic:  CN 2-12 grossly intact, moves all extremities in coordinated fashion, sensation intact  Labs on Admission: I have personally reviewed following labs and imaging studies  CBC: Recent Labs  Lab 12/01/19 1927  WBC 6.8  NEUTROABS 3.1  HGB 14.5  HCT 43.2  MCV 95.4  PLT 629   Basic Metabolic Panel: Recent Labs  Lab 12/01/19 1927  NA 139  K 4.1  CL 100  CO2 29  GLUCOSE 99  BUN 8  CREATININE 0.74  CALCIUM 9.1   GFR: Estimated Creatinine Clearance: 96.7 mL/min  (by C-G formula based on SCr of 0.74 mg/dL). Liver Function Tests: Recent Labs  Lab 12/01/19 1927  AST 23  ALT 28  ALKPHOS 128*  BILITOT 0.8  PROT 7.8  ALBUMIN 3.4*   No results for input(s): LIPASE, AMYLASE in the last 168 hours. No results for input(s): AMMONIA in the last 168 hours. Coagulation Profile: No results for input(s): INR, PROTIME in the last 168 hours. Cardiac  Enzymes: No results for input(s): CKTOTAL, CKMB, CKMBINDEX, TROPONINI in the last 168 hours. BNP (last 3 results) No results for input(s): PROBNP in the last 8760 hours. HbA1C: No results for input(s): HGBA1C in the last 72 hours. CBG: No results for input(s): GLUCAP in the last 168 hours. Lipid Profile: Recent Labs    12/01/19 1928  TRIG 243*   Thyroid Function Tests: No results for input(s): TSH, T4TOTAL, FREET4, T3FREE, THYROIDAB in the last 72 hours. Anemia Panel: Recent Labs    12/01/19 1928  FERRITIN 305   Urine analysis:    Component Value Date/Time   COLORURINE YELLOW 04/29/2015 2123   APPEARANCEUR CLEAR 04/29/2015 2123   LABSPEC 1.010 04/29/2015 2123   PHURINE 6.0 04/29/2015 2123   GLUCOSEU NEGATIVE 04/29/2015 2123   HGBUR NEGATIVE 04/29/2015 2123   BILIRUBINUR NEGATIVE 04/29/2015 2123   KETONESUR TRACE (A) 04/29/2015 2123   PROTEINUR NEGATIVE 04/29/2015 2123   UROBILINOGEN 0.2 04/29/2015 2123   NITRITE NEGATIVE 04/29/2015 2123   LEUKOCYTESUR NEGATIVE 04/29/2015 2123    Creatinine Clearance: Estimated Creatinine Clearance: 96.7 mL/min (by C-G formula based on SCr of 0.74 mg/dL).  Sepsis Labs: @LABRCNTIP (procalcitonin:4,lacticidven:4) ) Recent Results (from the past 240 hour(s))  Blood Culture (routine x 2)     Status: None (Preliminary result)   Collection Time: 12/01/19  7:27 PM   Specimen: Blood  Result Value Ref Range Status   Specimen Description RIGHT ANTECUBITAL  Final   Special Requests   Final    BOTTLES DRAWN AEROBIC AND ANAEROBIC Blood Culture adequate  volume Performed at [facility], [address] [place], [place] [address]    Culture PENDING  Incomplete   Report Status PENDING  Incomplete  Blood Culture (routine x 2)     Status: None (Preliminary result)   Collection Time: 12/01/19  7:28 PM   Specimen: Blood  Result Value Ref Range Status   Specimen Description BLOOD RIGHT HAND  Final   Special Requests   Final    BOTTLES DRAWN AEROBIC AND ANAEROBIC Blood Culture adequate volume Performed at [facility], [address] [place], [place] [address]    Culture PENDING  Incomplete   Report Status PENDING  Incomplete     Radiological Exams on Admission: DG Chest 2 View  Result Date: 11/30/2019 CLINICAL DATA:  Patient has cough, sob, abdominal pain and nausea x 3 days. Positive CoVID test. Current smoker, COPD. shortness of breath EXAM: CHEST - 2 VIEW COMPARISON:  Radiograph 11/18/2018 FINDINGS: Normal cardiac silhouette. There is patchy airspace densities along the RIGHT lateral chest wall. Lateral projection place disease in the RIGHT middle lobe. Lungs are hyperinflated. No acute osseous findings. IMPRESSION: Nodular densities in the periphery of the RIGHT middle lobe. Differential would include bronchopneumonia, viral pneumonia (COVID) or pulmonary neoplasm. Favor COVID pneumonia in light of positive COVID test. Recommend follow-up radiographs to ensure resolution and exclude neoplasm. Electronically Signed   By: Suzy Bouchard M.D.   On: 11/30/2019 17:47   DG Chest Port 1 View  Result Date: 12/01/2019 CLINICAL DATA:  Cough and shortness of breath COVID-19 virus infection EXAM: PORTABLE CHEST 1 VIEW COMPARISON:  11/30/2019 FINDINGS: The heart size and mediastinal contours are within normal limits. Patchy airspace disease is seen in the peripheral lung bases bilaterally, with mild worsening since previous study. Pulmonary emphysema also noted. No evidence of pleural effusion. IMPRESSION: 1. Mild worsening of peripheral bibasilar  airspace disease since prior study. 2. Emphysema. Electronically Signed   By: Myles Rosenthal.D.  On: 12/01/2019 18:59    EKG: Independently reviewed. Sinus. 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 Pt 16 days out from Dx so not likely infective at this time. D-dimer 1.4, fibrinogen 651, CRP 5.7, LDH 141 - Solumedrol 60 Q6 - Doxy - Duoneb Q4 - Hold Remdesivir Tx for now.  - O2 prn  Depression/Anxiety: Marked elevation given current illness and long wait in ED - Continue Home Xanax and Nortiptyline  Chronic pain: - continue Norco  GERD: - protonix   DVT prophylaxis: Lovenox  Code Status: FULL  Family Communication: none  Disposition Plan: pending return of O2 to baseline  Consults called: None  Admission status: Observation    Waldemar Dickens MD [facility]  If 7PM-7AM, please contact night-coverage [email or web address] Password [facility]  12/01/2019, 9:45 PM

## 2019-12-01 NOTE — ED Triage Notes (Addendum)
Pt reports resident of [facility] cough, congestion.fatigue. pt was sent over from dr. Malachy Moan office for evaluation regarding pneumonia. Pt reports tested positive for COVID on 12/13. Pt alert, moderate dyspnea at rest. Pt denies pain.   Pt o2 saturation on room air ranging from 89-91%. Pt placed on 2 liters [place].

## 2019-12-01 NOTE — ED Notes (Signed)
Pt walking around in room. Pt had taken ECG leads off and O2 off. ECG leads replaced, O2 replaced, sats 80's on 2L pt bumped to 4L and sats increased to 93%.

## 2019-12-02 ENCOUNTER — Inpatient Hospital Stay (HOSPITAL_COMMUNITY): Payer: Medicare Other

## 2019-12-02 DIAGNOSIS — Z9981 Dependence on supplemental oxygen: Secondary | ICD-10-CM | POA: Diagnosis not present

## 2019-12-02 DIAGNOSIS — Z79899 Other long term (current) drug therapy: Secondary | ICD-10-CM | POA: Diagnosis not present

## 2019-12-02 DIAGNOSIS — J1282 Pneumonia due to coronavirus disease 2019: Secondary | ICD-10-CM | POA: Diagnosis present

## 2019-12-02 DIAGNOSIS — Z888 Allergy status to other drugs, medicaments and biological substances status: Secondary | ICD-10-CM | POA: Diagnosis not present

## 2019-12-02 DIAGNOSIS — J449 Chronic obstructive pulmonary disease, unspecified: Secondary | ICD-10-CM | POA: Diagnosis not present

## 2019-12-02 DIAGNOSIS — J440 Chronic obstructive pulmonary disease with acute lower respiratory infection: Secondary | ICD-10-CM | POA: Diagnosis present

## 2019-12-02 DIAGNOSIS — I471 Supraventricular tachycardia: Secondary | ICD-10-CM | POA: Diagnosis present

## 2019-12-02 DIAGNOSIS — Z89511 Acquired absence of right leg below knee: Secondary | ICD-10-CM | POA: Diagnosis not present

## 2019-12-02 DIAGNOSIS — Z886 Allergy status to analgesic agent status: Secondary | ICD-10-CM | POA: Diagnosis not present

## 2019-12-02 DIAGNOSIS — F419 Anxiety disorder, unspecified: Secondary | ICD-10-CM | POA: Diagnosis present

## 2019-12-02 DIAGNOSIS — J9601 Acute respiratory failure with hypoxia: Secondary | ICD-10-CM | POA: Diagnosis present

## 2019-12-02 DIAGNOSIS — J1289 Other viral pneumonia: Secondary | ICD-10-CM | POA: Diagnosis not present

## 2019-12-02 DIAGNOSIS — M5440 Lumbago with sciatica, unspecified side: Secondary | ICD-10-CM | POA: Diagnosis not present

## 2019-12-02 DIAGNOSIS — U071 COVID-19: Secondary | ICD-10-CM

## 2019-12-02 DIAGNOSIS — G8929 Other chronic pain: Secondary | ICD-10-CM | POA: Diagnosis present

## 2019-12-02 DIAGNOSIS — K219 Gastro-esophageal reflux disease without esophagitis: Secondary | ICD-10-CM | POA: Diagnosis present

## 2019-12-02 DIAGNOSIS — R569 Unspecified convulsions: Secondary | ICD-10-CM | POA: Diagnosis present

## 2019-12-02 DIAGNOSIS — Z7951 Long term (current) use of inhaled steroids: Secondary | ICD-10-CM | POA: Diagnosis not present

## 2019-12-02 DIAGNOSIS — Z8616 Personal history of COVID-19: Secondary | ICD-10-CM | POA: Diagnosis not present

## 2019-12-02 DIAGNOSIS — M549 Dorsalgia, unspecified: Secondary | ICD-10-CM | POA: Diagnosis present

## 2019-12-02 DIAGNOSIS — F17210 Nicotine dependence, cigarettes, uncomplicated: Secondary | ICD-10-CM | POA: Diagnosis present

## 2019-12-02 DIAGNOSIS — E785 Hyperlipidemia, unspecified: Secondary | ICD-10-CM | POA: Diagnosis present

## 2019-12-02 DIAGNOSIS — J441 Chronic obstructive pulmonary disease with (acute) exacerbation: Secondary | ICD-10-CM | POA: Diagnosis present

## 2019-12-02 LAB — RESPIRATORY PANEL BY RT PCR (FLU A&B, COVID)
Influenza A by PCR: NEGATIVE
Influenza B by PCR: NEGATIVE
SARS Coronavirus 2 by RT PCR: POSITIVE — AB

## 2019-12-02 LAB — D-DIMER, QUANTITATIVE: D-Dimer, Quant: 1.44 ug/mL-FEU — ABNORMAL HIGH (ref 0.00–0.50)

## 2019-12-02 LAB — FERRITIN: Ferritin: 304 ng/mL (ref 24–336)

## 2019-12-02 LAB — C-REACTIVE PROTEIN: CRP: 5.4 mg/dL — ABNORMAL HIGH (ref <1.0)

## 2019-12-02 LAB — HIV ANTIBODY (ROUTINE TESTING W REFLEX): HIV Screen 4th Generation wRfx: NONREACTIVE

## 2019-12-02 MED ORDER — SODIUM CHLORIDE 0.9 % IV SOLN
200.0000 mg | Freq: Once | INTRAVENOUS | Status: AC
  Administered 2019-12-02: 200 mg via INTRAVENOUS
  Filled 2019-12-02: qty 40

## 2019-12-02 MED ORDER — IOHEXOL 350 MG/ML SOLN
100.0000 mL | Freq: Once | INTRAVENOUS | Status: AC | PRN
  Administered 2019-12-02: 100 mL via INTRAVENOUS

## 2019-12-02 MED ORDER — ACETAMINOPHEN 325 MG PO TABS: 650.0000 mg | ORAL_TABLET | Freq: Four times a day (QID) | ORAL | Status: DC | PRN

## 2019-12-02 MED ORDER — METHYLPREDNISOLONE SODIUM SUCC 125 MG IJ SOLR
0.5000 mg/kg | Freq: Two times a day (BID) | INTRAMUSCULAR | Status: DC
  Administered 2019-12-02 – 2019-12-03 (×2): 40.625 mg via INTRAVENOUS
  Filled 2019-12-02 (×2): qty 2

## 2019-12-02 MED ORDER — HYDROCOD POLST-CPM POLST ER 10-8 MG/5ML PO SUER: 5.0000 mL | Freq: Two times a day (BID) | ORAL | Status: DC | PRN

## 2019-12-02 MED ORDER — GUAIFENESIN-DM 100-10 MG/5ML PO SYRP: 10.0000 mL | ORAL_SOLUTION | ORAL | Status: DC | PRN

## 2019-12-02 MED ORDER — ONDANSETRON HCL 4 MG PO TABS: 4.0000 mg | ORAL_TABLET | Freq: Four times a day (QID) | ORAL | Status: DC | PRN

## 2019-12-02 MED ORDER — ONDANSETRON HCL 4 MG/2ML IJ SOLN: 4.0000 mg | Freq: Four times a day (QID) | INTRAMUSCULAR | Status: DC | PRN

## 2019-12-02 MED ORDER — ENOXAPARIN SODIUM 40 MG/0.4ML ~~LOC~~ SOLN: 40.0000 mg | SUBCUTANEOUS | Status: DC

## 2019-12-02 MED ORDER — ALBUTEROL SULFATE HFA 108 (90 BASE) MCG/ACT IN AERS
2.0000 | INHALATION_SPRAY | Freq: Four times a day (QID) | RESPIRATORY_TRACT | Status: DC
  Administered 2019-12-02 – 2019-12-03 (×4): 2 via RESPIRATORY_TRACT
  Filled 2019-12-02: qty 6.7

## 2019-12-02 MED ORDER — SODIUM CHLORIDE 0.9 % IV SOLN
100.0000 mg | Freq: Every day | INTRAVENOUS | Status: DC
  Administered 2019-12-03: 100 mg via INTRAVENOUS
  Filled 2019-12-02 (×4): qty 20

## 2019-12-02 NOTE — ED Notes (Signed)
Pt came out of his room and states that he is ready to go home. I explained to the patinet that he was admited to the hospital because of covid. He states that no one has told him anything all night he had removed his IV and monitor. I messaged the doctor to come and talk to him about what is going on with his care.

## 2019-12-02 NOTE — ED Notes (Signed)
Pt ambulatory with no difficulty or distress noted. O2 sats remain above 93% on RA.

## 2019-12-02 NOTE — ED Notes (Signed)
Date and time results received: 12/02/19 1514 (use smartphrase ".now" to insert current time)   Test: COVID Critical Value: postive  Name of Provider Notified: memon  Orders Received? Or Actions Taken?: see chart

## 2019-12-02 NOTE — Progress Notes (Signed)
PROGRESS NOTE    [name]  CZY:[id number] DOB: Nov 11, 1951 DOA: 12/01/2019 PCP: [name], MD    Brief Narrative:  68 year old male who is a resident of an assisted living facility, was recently diagnosed with COVID-19 on 12/13.  He apparently did not have any significant symptoms.  Over the past 48 hours, he has had progressive hypoxia.  He had a virtual appointment with his primary care physician yesterday who told him come to the emergency room.  On arrival to the emergency room he is noted to be hypoxic on room air.  Chest x-ray shows progressive infiltrates.  His COVID-19 test remains positive.  Inflammatory markers are mildly elevated, procalcitonin is negative.  Patient was admitted to the hospital, started on steroids, bronchodilators and remdesivir.   Assessment & Plan:   Active Problems:   Chronic back pain   History of left below knee amputation (HCC)   GERD (gastroesophageal reflux disease)   COPD mixed type (HCC)   Acute respiratory failure ([place])   Pneumonia due to COVID-19 virus   1. Acute respiratory failure with hypoxia.  Secondary to COVID-19 pneumonia.  Likely also has a component of COPD exacerbation.  Continue to wean down oxygen as tolerated. 2. COVID-19 pneumonia.  Chest x-ray shows progressive infiltrates.  CRP mildly elevated at 5.  Continue on steroids and remdesivir.  If he continues to decompensate, can consider Actemra. 3. COPD exacerbation.  Continues to wheeze.  Continue on steroids and bronchodilators. 4. Chronic pain.  Continue on hydrocodone 5. GERD.  Continue Protonix.   DVT prophylaxis: lovenox Code Status: full code Family Communication: none present Disposition Plan: admit   Consultants:     Procedures:     Antimicrobials:      Subjective: Denies any shortness of breath.  No chest pain.  He has been hypoxic on room air and is requiring supplemental oxygen.  Objective: Vitals:   12/02/19 0030 12/02/19 1349 12/02/19  1351 12/02/19 1400  BP: (!) 97/54 124/87  123/84  Pulse: 91 (!) 107 (!) 108 (!) 107  Resp: (!) 21   15  Temp:      TempSrc:      SpO2: 91% (!) 86% 90% 91%  Weight:      Height:       No intake or output data in the 24 hours ending 12/02/19 1739 Filed Weights   12/01/19 1648  Weight: 81.6 kg    Examination:  General exam: Appears calm and comfortable  Respiratory system: wheezing at bases. Respiratory effort normal. Cardiovascular system: S1 & S2 heard, RRR. No JVD, murmurs, rubs, gallops or clicks. No pedal edema. Gastrointestinal system: Abdomen is nondistended, soft and nontender. No organomegaly or masses felt. Normal bowel sounds heard. Central nervous system: Alert and oriented. No focal neurological deficits. Extremities: R BKA Skin: No rashes, lesions or ulcers Psychiatry: Judgement and insight appear normal. Mood & affect appropriate.     Data Reviewed: I have personally reviewed following labs and imaging studies  CBC: Recent Labs  Lab 12/01/19 1927  WBC 6.8  NEUTROABS 3.1  HGB 14.5  HCT 43.2  MCV 95.4  PLT 093   Basic Metabolic Panel: Recent Labs  Lab 12/01/19 1927  NA 139  K 4.1  CL 100  CO2 29  GLUCOSE 99  BUN 8  CREATININE 0.74  CALCIUM 9.1   GFR: Estimated Creatinine Clearance: 96.7 mL/min (by C-G formula based on SCr of 0.74 mg/dL). Liver Function Tests: Recent Labs  Lab 12/01/19 1927  AST 23  ALT 28  ALKPHOS 128*  BILITOT 0.8  PROT 7.8  ALBUMIN 3.4*   No results for input(s): LIPASE, AMYLASE in the last 168 hours. No results for input(s): AMMONIA in the last 168 hours. Coagulation Profile: No results for input(s): INR, PROTIME in the last 168 hours. Cardiac Enzymes: No results for input(s): CKTOTAL, CKMB, CKMBINDEX, TROPONINI in the last 168 hours. BNP (last 3 results) No results for input(s): PROBNP in the last 8760 hours. HbA1C: No results for input(s): HGBA1C in the last 72 hours. CBG: No results for input(s): GLUCAP in  the last 168 hours. Lipid Profile: Recent Labs    12/01/19 1928  TRIG 243*   Thyroid Function Tests: No results for input(s): TSH, T4TOTAL, FREET4, T3FREE, THYROIDAB in the last 72 hours. Anemia Panel: Recent Labs    12/01/19 1928 12/02/19 0415  FERRITIN 305 304   Sepsis Labs: Recent Labs  Lab 12/01/19 1927  PROCALCITON <0.10  <0.10  LATICACIDVEN 0.8    Recent Results (from the past 240 hour(s))  Blood Culture (routine x 2)     Status: None (Preliminary result)   Collection Time: 12/01/19  7:27 PM   Specimen: Right Antecubital; Blood  Result Value Ref Range Status   Specimen Description RIGHT ANTECUBITAL  Final   Special Requests   Final    BOTTLES DRAWN AEROBIC AND ANAEROBIC Blood Culture adequate volume   Culture   Final    NO GROWTH < 24 HOURS Performed at [facility], [address], [place], [place] [address]    Report Status PENDING  Incomplete  Blood Culture (routine x 2)     Status: None (Preliminary result)   Collection Time: 12/01/19  7:28 PM   Specimen: BLOOD RIGHT HAND  Result Value Ref Range Status   Specimen Description BLOOD RIGHT HAND  Final   Special Requests   Final    BOTTLES DRAWN AEROBIC AND ANAEROBIC Blood Culture adequate volume   Culture   Final    NO GROWTH < 24 HOURS Performed at [facility], [address], [place], [place] [address]    Report Status PENDING  Incomplete  Respiratory Panel by RT PCR (Flu A&B, Covid) - Nasopharyngeal Swab     Status: Abnormal   Collection Time: 12/02/19  1:58 PM   Specimen: Nasopharyngeal Swab  Result Value Ref Range Status   SARS Coronavirus 2 by RT PCR POSITIVE (A) NEGATIVE Final    Comment: RESULT CALLED TO, READ BACK BY AND VERIFIED WITH: [place],C AT 1308 ON [id number] BY THOMPSON S.    Influenza A by PCR NEGATIVE NEGATIVE Final   Influenza B by PCR NEGATIVE NEGATIVE Final    Comment: (NOTE) The Xpert Xpress SARS-CoV-2/FLU/RSV assay is intended as an aid in  the diagnosis of influenza from  Nasopharyngeal swab specimens and  should not be used as a sole basis for treatment. Nasal washings and  aspirates are unacceptable for Xpert Xpress SARS-CoV-2/FLU/RSV  testing. Fact Sheet for Patients: PinkCheek.be Fact Sheet for Healthcare Providers: GravelBags.it This test is not yet approved or cleared by the [place] FDA and  has been authorized for detection and/or diagnosis of SARS-CoV-2 by  FDA under an Emergency Use Authorization (EUA). This EUA will remain  in effect (meaning this test can be used) for the duration of the  Covid-19 declaration under Section 564(b)(1) of the Act, 21  U.S.C. section 360bbb-3(b)(1), unless the authorization is  terminated or revoked. Performed at [facility], [address]  [place], [place] [address]          Radiology Studies: DG Chest Port 1 View  Result Date: 12/01/2019 CLINICAL DATA:  Cough and shortness of breath COVID-19 virus infection EXAM: PORTABLE CHEST 1 VIEW COMPARISON:  11/30/2019 FINDINGS: The heart size and mediastinal contours are within normal limits. Patchy airspace disease is seen in the peripheral lung bases bilaterally, with mild worsening since previous study. Pulmonary emphysema also noted. No evidence of pleural effusion. IMPRESSION: 1. Mild worsening of peripheral bibasilar airspace disease since prior study. 2. Emphysema. Electronically Signed   By: Marlaine Hind M.D.   On: 12/01/2019 18:59        Scheduled Meds: . albuterol  2 puff Inhalation Q6H  . vitamin C  500 mg Oral Daily  . aspirin EC  81 mg Oral Daily  . doxycycline  100 mg Oral Q12H  . enoxaparin (LOVENOX) injection  40 mg Subcutaneous Q24H  . ipratropium-albuterol  3 mL Nebulization Q6H  . methylPREDNISolone (SOLU-MEDROL) injection  0.5 mg/kg Intravenous Q12H  . mometasone-formoterol  2 puff Inhalation BID  . nortriptyline  100 mg Oral QHS  . pantoprazole  40 mg Oral Daily  . zinc  sulfate  220 mg Oral Daily   Continuous Infusions: . [START ON 12/03/2019] remdesivir 100 mg in NS 100 mL       LOS: 0 days    Time spent: 20mins    Kathie Dike, MD [facility]   If 7PM-7AM, please contact night-coverage [email or web address]  12/02/2019, 5:39 PM

## 2019-12-02 NOTE — ED Notes (Signed)
Pt returned from CT °

## 2019-12-02 NOTE — Progress Notes (Signed)
PT Cancellation Note  Patient Details Name: RAHKEEM SENFT MRN: [id number] DOB: 30-Sep-1951   Cancelled Treatment:    Reason Eval/Treat Not Completed: PT screened, no needs identified, will sign off   9:37 AM, 12/02/19 Lonell Grandchild, MPT Physical Therapist with [facility] 336 [phone] office [phone] mobile phone  .

## 2019-12-02 NOTE — ED Notes (Addendum)
soul-medrol not given . Patient stated prior at medication administration that he wanted to sleep. Patient removed all monitor chords, BP cuff, and pulse ox.

## 2019-12-03 LAB — COMPREHENSIVE METABOLIC PANEL
ALT: 22 U/L (ref 0–44)
AST: 19 U/L (ref 15–41)
Albumin: 3.1 g/dL — ABNORMAL LOW (ref 3.5–5.0)
Alkaline Phosphatase: 104 U/L (ref 38–126)
Anion gap: 12 (ref 5–15)
BUN: 16 mg/dL (ref 8–23)
CO2: 28 mmol/L (ref 22–32)
Calcium: 9.1 mg/dL (ref 8.9–10.3)
Chloride: 100 mmol/L (ref 98–111)
Creatinine, Ser: 0.67 mg/dL (ref 0.61–1.24)
GFR calc Af Amer: >60 mL/min (ref >60)
GFR calc non Af Amer: >60 mL/min (ref >60)
Glucose, Bld: 154 mg/dL — ABNORMAL HIGH (ref 70–99)
Potassium: 4 mmol/L (ref 3.5–5.1)
Sodium: 140 mmol/L (ref 135–145)
Total Bilirubin: 0.8 mg/dL (ref 0.3–1.2)
Total Protein: 7.1 g/dL (ref 6.5–8.1)

## 2019-12-03 LAB — CBC WITH DIFFERENTIAL/PLATELET
Abs Immature Granulocytes: 0.05 10*3/uL (ref 0.00–0.07)
Basophils Absolute: 0 10*3/uL (ref 0.0–0.1)
Basophils Relative: 0 %
Eosinophils Absolute: 0 10*3/uL (ref 0.0–0.5)
Eosinophils Relative: 0 %
HCT: 42.1 % (ref 39.0–52.0)
Hemoglobin: 14 g/dL (ref 13.0–17.0)
Immature Granulocytes: 1 %
Lymphocytes Relative: 17 %
Lymphs Abs: 1.2 10*3/uL (ref 0.7–4.0)
MCH: 31.7 pg (ref 26.0–34.0)
MCHC: 33.3 g/dL (ref 30.0–36.0)
MCV: 95.5 fL (ref 80.0–100.0)
Monocytes Absolute: 0.4 10*3/uL (ref 0.1–1.0)
Monocytes Relative: 5 %
Neutro Abs: 5.4 10*3/uL (ref 1.7–7.7)
Neutrophils Relative %: 77 %
Platelets: 437 10*3/uL — ABNORMAL HIGH (ref 150–400)
RBC: 4.41 MIL/uL (ref 4.22–5.81)
RDW: 12.2 % (ref 11.5–15.5)
WBC: 7 10*3/uL (ref 4.0–10.5)
nRBC: 0 % (ref 0.0–0.2)

## 2019-12-03 LAB — FERRITIN: Ferritin: 259 ng/mL (ref 24–336)

## 2019-12-03 LAB — D-DIMER, QUANTITATIVE: D-Dimer, Quant: 1.15 ug/mL-FEU — ABNORMAL HIGH (ref 0.00–0.50)

## 2019-12-03 LAB — C-REACTIVE PROTEIN: CRP: 2.8 mg/dL — ABNORMAL HIGH (ref <1.0)

## 2019-12-03 MED ORDER — GUAIFENESIN-DM 100-10 MG/5ML PO SYRP: 10.0000 mL | ORAL_SOLUTION | ORAL | 0 refills | Status: DC | PRN

## 2019-12-03 MED ORDER — PREDNISONE 20 MG PO TABS: 40.0000 mg | ORAL_TABLET | Freq: Every day | ORAL | 0 refills | Status: AC

## 2019-12-03 MED ORDER — ZINC SULFATE 220 (50 ZN) MG PO CAPS: 220.0000 mg | ORAL_CAPSULE | Freq: Every day | ORAL | 0 refills | Status: DC

## 2019-12-03 MED ORDER — BUDESONIDE-FORMOTEROL FUMARATE 80-4.5 MCG/ACT IN AERO: INHALATION_SPRAY | RESPIRATORY_TRACT | 5 refills | Status: DC

## 2019-12-03 MED ORDER — ASCORBIC ACID 500 MG PO TABS: 500.0000 mg | ORAL_TABLET | Freq: Every day | ORAL | 0 refills | Status: DC

## 2019-12-03 MED ORDER — ALBUTEROL SULFATE HFA 108 (90 BASE) MCG/ACT IN AERS: 2.0000 | INHALATION_SPRAY | RESPIRATORY_TRACT | 5 refills | Status: DC | PRN

## 2019-12-03 NOTE — [place] FL2 (Signed)
Many MEDICAID FL2 LEVEL OF CARE SCREENING TOOL     IDENTIFICATION  Patient Name: [name] Birthdate: [birth year] Sex: male Admission Date (Current Location): 12/01/2019  [facility] and [place] Number:  [employer] and Address:  [place] [facility] [address], [place]      Provider Number: [id number]  Attending Physician Name and Address:  Kathie Dike, MD  Relative Name and Phone Number:  [name] - sister [phone]    Current Level of Care: Hospital Recommended Level of Care: [place] Prior Approval Number:    Date Approved/Denied:   PASRR Number:    Discharge Plan: Domiciliary (Rest home)    Current Diagnoses: Patient Active Problem List   Diagnosis Date Noted  . Pneumonia due to COVID-19 virus 12/02/2019  . Acute respiratory failure ([place]) 12/01/2019  . COPD mixed type ([place]) 02/02/2018  . GERD (gastroesophageal reflux disease) 08/29/2017  . Chronic leg pain 12/15/2015  . History of left below knee amputation ([place]) 08/31/2015  . SVT (supraventricular tachycardia) ([place]) 04/30/2015  . Seizure ([place]) 04/29/2015  . Hyperlipidemia 02/18/2014  . Chronic back pain 07/10/2013  . Insomnia 07/10/2013  . Generalized anxiety disorder 07/10/2013    Orientation RESPIRATION BLADDER Height & Weight     Self, Time, Situation, Place  O2(2L) Incontinent(occasional) Weight: 81.6 kg Height:  5\' 11"  (180.3 cm)  BEHAVIORAL SYMPTOMS/MOOD NEUROLOGICAL BOWEL NUTRITION STATUS      Continent Diet(regular)  AMBULATORY STATUS COMMUNICATION OF NEEDS Skin   Limited Assist Verbally Normal                       Personal Care Assistance Level of Assistance  Bathing, Feeding, Dressing Bathing Assistance: Limited assistance Feeding assistance: Independent Dressing Assistance: Limited assistance     Functional Limitations Info  Sight, Hearing, Speech Sight Info: Adequate Hearing Info: Adequate Speech Info: Adequate     SPECIAL CARE FACTORS FREQUENCY                       Contractures Contractures Info: Not present    Additional Factors Info  Code Status, Allergies Code Status Info: Full Allergies Info: Aspirin  benadryl           Current Medications (12/03/2019):  This is the current hospital active medication list Current Facility-Administered Medications  Medication Dose Route Frequency Provider Last Rate Last Admin  . acetaminophen (TYLENOL) tablet 650 mg  650 mg Oral Q6H PRN Kathie Dike, MD      . albuterol (VENTOLIN HFA) 108 (90 Base) MCG/ACT inhaler 2 puff  2 puff Inhalation Q6H Kathie Dike, MD   2 puff at 12/03/19 1034  . ALPRAZolam Duanne Moron) tablet 0.5 mg  0.5 mg Oral TID PRN Waldemar Dickens, MD   0.5 mg at 12/02/19 2354  . ascorbic acid (VITAMIN C) tablet 500 mg  500 mg Oral Daily Waldemar Dickens, MD   500 mg at 12/03/19 1034  . aspirin EC tablet 81 mg  81 mg Oral Daily Waldemar Dickens, MD   81 mg at 12/03/19 1035  . chlorpheniramine-HYDROcodone (TUSSIONEX) 10-8 MG/5ML suspension 5 mL  5 mL Oral Q12H PRN Kathie Dike, MD      . enoxaparin (LOVENOX) injection 40 mg  40 mg Subcutaneous Q24H Waldemar Dickens, MD   40 mg at 12/02/19 2220  . guaiFENesin-dextromethorphan (ROBITUSSIN DM) 100-10 MG/5ML syrup 10 mL  10 mL Oral Q4H PRN Kathie Dike, MD      .  hydrALAZINE (APRESOLINE) injection 5-10 mg  5-10 mg Intravenous Q4H PRN Waldemar Dickens, MD      . HYDROcodone-acetaminophen (NORCO/VICODIN) 5-325 MG per tablet 1 tablet  1 tablet Oral Q8H PRN Waldemar Dickens, MD   1 tablet at 12/02/19 2354  . ipratropium-albuterol (DUONEB) 0.5-2.5 (3) MG/3ML nebulizer solution 3 mL  3 mL Nebulization Q6H Waldemar Dickens, MD      . methylPREDNISolone sodium succinate (SOLU-MEDROL) 125 mg/2 mL injection 40.625 mg  0.5 mg/kg Intravenous Q12H Kathie Dike, MD   40.625 mg at 12/03/19 0339  . mometasone-formoterol (DULERA) 100-5 MCG/ACT inhaler 2 puff  2 puff Inhalation BID Waldemar Dickens, MD   2 puff at 12/03/19 1034  . nortriptyline (PAMELOR) capsule 100 mg  100 mg Oral QHS Waldemar Dickens, MD   100 mg at 12/02/19 2354  . ondansetron (ZOFRAN) tablet 4 mg  4 mg Oral Q6H PRN Waldemar Dickens, MD       Or  . ondansetron [facility]) injection 4 mg  4 mg Intravenous Q6H PRN Waldemar Dickens, MD      . pantoprazole (PROTONIX) EC tablet 40 mg  40 mg Oral Daily Waldemar Dickens, MD   40 mg at 12/03/19 1034  . remdesivir 100 mg in sodium chloride 0.9 % 100 mL IVPB  100 mg Intravenous Daily Kathie Dike, MD 200 mL/hr at 12/03/19 1033 100 mg at 12/03/19 1033  . zinc sulfate capsule 220 mg  220 mg Oral Daily Waldemar Dickens, MD   220 mg at 12/03/19 1034   Current Outpatient Medications  Medication Sig Dispense Refill  . acetaminophen (MAPAP) 500 MG tablet One po BID prn (Patient taking differently: Take 500 mg by mouth 2 (two) times daily as needed for mild pain or moderate pain. ) 60 tablet 5  . ALPRAZolam (XANAX) 0.5 MG tablet 1 at 8am, 1 at 2pm, 1qhs (Patient taking differently: Take 0.5 mg by mouth 3 (three) times daily. ) 90 tablet 5  . HYDROcodone-acetaminophen (NORCO/VICODIN) 5-325 MG tablet TAKE 1 TABLET BY MOUTH 3 TIMES DAILY AT 8am, 2pm & 8pm. 90 tablet 0  . nortriptyline (PAMELOR) 50 MG capsule Take 2 capsules (100 mg total) by mouth at bedtime. 60 capsule 5  . omeprazole (PRILOSEC) 40 MG capsule Take 1 capsule (40 mg total) by mouth daily. 30 capsule 5  . temazepam (RESTORIL) 15 MG capsule TAKE (1) CAPSULE BY MOUTH AT BEDTIME. 30 capsule 5  . albuterol (VENTOLIN HFA) 108 (90 Base) MCG/ACT inhaler Inhale 2 puffs into the lungs every 4 (four) hours as needed for wheezing. 18 g 5  . ascorbic acid (VITAMIN C) 500 MG tablet Take 1 tablet (500 mg total) by mouth daily. 30 tablet 0  . budesonide-formoterol (SYMBICORT) 80-4.5 MCG/ACT inhaler INHALE 2 PUFFS INTO LUNGS TWICE DAILY. RINSE MOUTH AFTER USE TO PREVENT THRUSH. 10.2 g 5  . guaiFENesin-dextromethorphan (ROBITUSSIN DM)  100-10 MG/5ML syrup Take 10 mLs by mouth every 4 (four) hours as needed for cough. 118 mL 0  . predniSONE (DELTASONE) 20 MG tablet Take 2 tablets (40 mg total) by mouth daily for 8 days. 16 tablet 0  . zinc sulfate 220 (50 Zn) MG capsule Take 1 capsule (220 mg total) by mouth daily. 30 capsule 0     Discharge Medications:     Medication List        TAKE these medications       acetaminophen 500 MG tablet Commonly known as: Mapap One  po BID prn What changed:   how much to take  how to take this  when to take this  reasons to take this  additional instructions   albuterol 108 (90 Base) MCG/ACT inhaler Commonly known as: VENTOLIN HFA Inhale 2 puffs into the lungs every 4 (four) hours as needed for wheezing.   ALPRAZolam 0.5 MG tablet Commonly known as: XANAX 1 at 8am, 1 at 2pm, 1qhs What changed:   how much to take  how to take this  when to take this  additional instructions   ascorbic acid 500 MG tablet Commonly known as: VITAMIN C Take 1 tablet (500 mg total) by mouth daily.   budesonide-formoterol 80-4.5 MCG/ACT inhaler Commonly known as: Symbicort INHALE 2 PUFFS INTO LUNGS TWICE DAILY. RINSE MOUTH AFTER USE TO PREVENT THRUSH.   guaiFENesin-dextromethorphan 100-10 MG/5ML syrup Commonly known as: ROBITUSSIN DM Take 10 mLs by mouth every 4 (four) hours as needed for cough.   HYDROcodone-acetaminophen 5-325 MG tablet Commonly known as: NORCO/VICODIN TAKE 1 TABLET BY MOUTH 3 TIMES DAILY AT 8am, 2pm & 8pm. What changed: Another medication with the same name was removed. Continue taking this medication, and follow the directions you see here.   nortriptyline 50 MG capsule Commonly known as: PAMELOR Take 2 capsules (100 mg total) by mouth at bedtime.   omeprazole 40 MG capsule Commonly known as: PRILOSEC Take 1 capsule (40 mg total) by mouth daily.   predniSONE 20 MG tablet Commonly known as: DELTASONE Take 2 tablets (40 mg total) by mouth  daily for 8 days.   temazepam 15 MG capsule Commonly known as: RESTORIL TAKE (1) CAPSULE BY MOUTH AT BEDTIME.   zinc sulfate 220 (50 Zn) MG capsule Take 1 capsule (220 mg total) by mouth daily.      Kathie Dike, MD    Kathie Dike, MD  Physician  Internal Medicine     Discharge Summary      Signed     Date of Service:  12/03/2019 11:11 AM     KHALIFA KNECHT ZOX:[id number] DOB: Feb 10, 1951 DOA: 12/01/2019     PCP: [name], MD     Admit date: 12/01/2019  Discharge date: 12/03/2019     Admitted From: [facility]  Disposition: [facility]     Recommendations for Outpatient Follow-up:   1.Follow up with PCP in 1-2 weeks   2.Please obtain BMP/CBC in one week   3.Repeat chest x-ray in 3 to 4 weeks      Home Health:  Equipment/Devices: Oxygen at 2 L via nasal cannula     Discharge Condition: Stable  CODE STATUS: Full code  Diet recommendation: Heart healthy     Brief/Interim Summary:  68 year old male with history of COPD, recently diagnosed with COVID-19 pneumonia on 12/13, admitted to the hospital with hypoxia and shortness of breath.  He was found to have COPD exacerbation as well as COVID-19 pneumonia and acute respiratory failure with hypoxia.  He was started on intravenous steroids.  He did receive a dose of remdesivir.  During his stay in the emergency room, patient was able to ambulate without difficulty.  He did not have any dyspnea.  He was noted to be mildly hypoxic down to 88 to 89% on room air.  Supplemental oxygen was applied which improved the oxygen saturations in the 90s.  Patient did not have any wheezing in his lungs sound clear.  He will be continued on a course of steroids.  He is  anxious to return home.  Patient was ambulated around the unit several times and did not have any subjective shortness of breath.     Discharge Diagnoses:   Active Problems:    Chronic back pain    History of left below  knee amputation (HCC)    GERD (gastroesophageal reflux disease)    COPD mixed type (HCC)    Acute respiratory failure (HCC)    Pneumonia due to COVID-19 virus         Diet - low sodium heart healthy     Complete by: As directed     For home use only DME oxygen     Complete by: As directed    Length of Need: 6 Months  Mode or (Route): Nasal cannula   Liters per Minute: 2    Oxygen delivery system: Gas   Increase activity slowly

## 2019-12-03 NOTE — ED Notes (Signed)
Patient walked, oxygen at 86% room air.  Patient back on 3 liters 02, sats 93%

## 2019-12-03 NOTE — TOC Transition Note (Signed)
Transition of Care [facility]) - CM/SW Discharge Note   Patient Details  Name: [name] MRN: [id number] Date of Birth: [birth year]  Transition of Care [facility]) CM/SW Contact:  Boneta Lucks, RN Phone Number: 12/03/2019, 11:59 AM   Clinical Narrative:   Patient in ED for COVID positive symptoms. Patient is from [facility]. Patient can discharge back to [facility] with oxygen. CM spoke with Tammy they use [employer]. Called and faxed orders.  ED RN updated. FL2 sent to Facility.       Barriers to Discharge: Barriers Resolved   Patient Goals and CMS Choice Patient states their goals for this hospitalization and ongoing recovery are:: to return to ALF CMS Medicare.gov Compare Post Acute Care list provided to:: Patient Represenative (must comment) Choice offered to / list presented to : (ALF)  Discharge Placement   Discharge Plan and Services   Discharge Planning Services: (oxygen)            DME Arranged: Oxygen DME Agency: [facility] Date DME Agency Contacted: 12/03/19 Time DME Agency Contacted: 1128 Representative spoke with at DME Agency: Faxed and called       Expected Discharge Plan: Home/Self Care(ALF) Barriers to Discharge: Barriers Resolved   Patient Goals and CMS Choice Patient states their goals for this hospitalization and ongoing recovery are:: to return to ALF CMS Medicare.gov Compare Post Acute Care list provided to:: Patient Represenative (must comment) Choice offered to / list presented to : (ALF)  Expected Discharge Plan and Services Expected Discharge Plan: Home/Self Care(ALF)   Discharge Planning Services: (oxygen)   Living arrangements for the past 2 months: [place] Expected Discharge Date: 12/03/19               DME Arranged: Oxygen DME Agency: [facility] Date DME Agency Contacted: 12/03/19 Time DME Agency Contacted: 1128 Representative spoke with at DME Agency: Faxed and called     Prior Living  Arrangements/Services Living arrangements for the past 2 months: [place] Lives with:: Facility Resident   Do you feel safe going back to the place where you live?: Yes      Need for Family Participation in Patient Care: Yes (Comment) Care giver support system in place?: Yes (comment) Current home services: DME Criminal Activity/Legal Involvement Pertinent to Current Situation/Hospitalization: No - Comment as needed  Activities of Daily Living      Permission Sought/Granted Permission sought to share information with : Case Manager    Share Information with NAME: Tammy  Permission granted to share info w AGENCY: [facility]        Emotional Assessment     Affect (typically observed): Accepting Orientation: : Oriented to Self, Oriented to Place, Oriented to  Time, Oriented to Situation Alcohol / Substance Use: Not Applicable Psych Involvement: No (comment)  Admission diagnosis:  Acute respiratory failure ([place]) [J96.00] Pneumonia due to COVID-19 virus [U07.1, J12.89] Patient Active Problem List   Diagnosis Date Noted  . Pneumonia due to COVID-19 virus 12/02/2019  . Acute respiratory failure ([place]) 12/01/2019  . COPD mixed type ([place]) 02/02/2018  . GERD (gastroesophageal reflux disease) 08/29/2017  . Chronic leg pain 12/15/2015  . History of left below knee amputation ([place]) 08/31/2015  . SVT (supraventricular tachycardia) ([place]) 04/30/2015  . Seizure ([place]) 04/29/2015  . Hyperlipidemia 02/18/2014  . Chronic back pain 07/10/2013  . Insomnia 07/10/2013  . Generalized anxiety disorder 07/10/2013   PCP:  [name], MD Pharmacy:   [facility], [place] -  [address] [place] [address] Phone: [phone] Fax: [phone]  [facility] [place], [place] - [place] [address] Phone: [phone] Fax: [phone]  Readmission Risk Interventions No flowsheet data found.

## 2019-12-03 NOTE — ED Notes (Signed)
Patient ambulated in room without oxygen.  o2 sat dropped to 89%   Patient placed back on 2lpm o2 and o2 sat increased to 97%

## 2019-12-03 NOTE — ED Notes (Signed)
[name] [phone] (pt's daughter)

## 2019-12-03 NOTE — Discharge Summary (Signed)
Physician Discharge Summary  [name] XNT:[id number] DOB: 1951/07/24 DOA: 12/01/2019  PCP: [name], MD  Admit date: 12/01/2019 Discharge date: 12/03/2019  Admitted From: [facility] Disposition: [facility]  Recommendations for Outpatient Follow-up:  1. Follow up with PCP in 1-2 weeks 2. Please obtain BMP/CBC in one week 3. Repeat chest x-ray in 3 to 4 weeks  Home Health: Equipment/Devices: Oxygen at 2 L via nasal cannula  Discharge Condition: Stable CODE STATUS: Full code Diet recommendation: Heart healthy  Brief/Interim Summary: 68 year old male with history of COPD, recently diagnosed with COVID-19 pneumonia on 12/13, admitted to the hospital with hypoxia and shortness of breath.  He was found to have COPD exacerbation as well as COVID-19 pneumonia and acute respiratory failure with hypoxia.  He was started on intravenous steroids.  He did receive a dose of remdesivir.  During his stay in the emergency room, patient was able to ambulate without difficulty.  He did not have any dyspnea.  He was noted to be mildly hypoxic down to 88 to 89% on room air.  Supplemental oxygen was applied which improved the oxygen saturations in the 90s.  Patient did not have any wheezing in his lungs sound clear.  He will be continued on a course of steroids.  He is anxious to return home.  Patient was ambulated around the unit several times and did not have any subjective shortness of breath.  Discharge Diagnoses:  Active Problems:   Chronic back pain   History of left below knee amputation (HCC)   GERD (gastroesophageal reflux disease)   COPD mixed type (HCC)   Acute respiratory failure ([place])   Pneumonia due to COVID-19 virus    Discharge Instructions  Discharge Instructions    Diet - low sodium heart healthy   Complete by: As directed    For home use only DME oxygen   Complete by: As directed    Length of Need: 6 Months   Mode or (Route): Nasal cannula   Liters per  Minute: 2   Oxygen delivery system: Gas   Increase activity slowly   Complete by: As directed    MyChart COVID-19 home monitoring program   Complete by: Dec 03, 2019    Is the patient willing to use the [place] for home monitoring?: Yes   Temperature monitoring   Complete by: Dec 03, 2019    After how many days would you like to receive a notification of this patient's flowsheet entries?: 1     Allergies as of 12/03/2019      Reactions   Asa [aspirin] Other (See Comments)   Intolerance to aspirin due to ulcer   Benadryl [diphenhydramine Hcl] Other (See Comments)   Nervousness, insomnia      Medication List    TAKE these medications   acetaminophen 500 MG tablet Commonly known as: Mapap One po BID prn What changed:   how much to take  how to take this  when to take this  reasons to take this  additional instructions   albuterol 108 (90 Base) MCG/ACT inhaler Commonly known as: VENTOLIN HFA Inhale 2 puffs into the lungs every 4 (four) hours as needed for wheezing.   ALPRAZolam 0.5 MG tablet Commonly known as: XANAX 1 at 8am, 1 at 2pm, 1qhs What changed:   how much to take  how to take this  when to take this  additional instructions   ascorbic acid 500 MG tablet Commonly known as:  VITAMIN C Take 1 tablet (500 mg total) by mouth daily.   budesonide-formoterol 80-4.5 MCG/ACT inhaler Commonly known as: Symbicort INHALE 2 PUFFS INTO LUNGS TWICE DAILY. RINSE MOUTH AFTER USE TO PREVENT THRUSH.   guaiFENesin-dextromethorphan 100-10 MG/5ML syrup Commonly known as: ROBITUSSIN DM Take 10 mLs by mouth every 4 (four) hours as needed for cough.   HYDROcodone-acetaminophen 5-325 MG tablet Commonly known as: NORCO/VICODIN TAKE 1 TABLET BY MOUTH 3 TIMES DAILY AT 8am, 2pm & 8pm. What changed: Another medication with the same name was removed. Continue taking this medication, and follow the directions you see here.   nortriptyline 50 MG capsule Commonly  known as: PAMELOR Take 2 capsules (100 mg total) by mouth at bedtime.   omeprazole 40 MG capsule Commonly known as: PRILOSEC Take 1 capsule (40 mg total) by mouth daily.   predniSONE 20 MG tablet Commonly known as: DELTASONE Take 2 tablets (40 mg total) by mouth daily for 8 days.   temazepam 15 MG capsule Commonly known as: RESTORIL TAKE (1) CAPSULE BY MOUTH AT BEDTIME.   zinc sulfate 220 (50 Zn) MG capsule Take 1 capsule (220 mg total) by mouth daily.            Durable Medical Equipment  (From admission, onward)         Start     Ordered   12/03/19 0000  For home use only DME oxygen    Question Answer Comment  Length of Need 6 Months   Mode or (Route) Nasal cannula   Liters per Minute 2   Oxygen delivery system Gas      12/03/19 0949          Allergies  Allergen Reactions  . Asa [Aspirin] Other (See Comments)    Intolerance to aspirin due to ulcer  . Benadryl [Diphenhydramine Hcl] Other (See Comments)    Nervousness, insomnia    Consultations:    Procedures/Studies: DG Chest 2 View  Result Date: 11/30/2019 CLINICAL DATA:  Patient has cough, sob, abdominal pain and nausea x 3 days. Positive CoVID test. Current smoker, COPD. shortness of breath EXAM: CHEST - 2 VIEW COMPARISON:  Radiograph 11/18/2018 FINDINGS: Normal cardiac silhouette. There is patchy airspace densities along the RIGHT lateral chest wall. Lateral projection place disease in the RIGHT middle lobe. Lungs are hyperinflated. No acute osseous findings. IMPRESSION: Nodular densities in the periphery of the RIGHT middle lobe. Differential would include bronchopneumonia, viral pneumonia (COVID) or pulmonary neoplasm. Favor COVID pneumonia in light of positive COVID test. Recommend follow-up radiographs to ensure resolution and exclude neoplasm. Electronically Signed   By: Suzy Bouchard M.D.   On: 11/30/2019 17:47   CT ANGIO CHEST PE W OR WO CONTRAST  Result Date: 12/03/2019 CLINICAL DATA:   Shortness of breath. Recent COVID-19 diagnosis 11/15/2019. Progressive hypoxia. EXAM: CT ANGIOGRAPHY CHEST WITH CONTRAST TECHNIQUE: Multidetector CT imaging of the chest was performed using the standard protocol during bolus administration of intravenous contrast. Multiplanar CT image reconstructions and MIPs were obtained to evaluate the vascular anatomy. CONTRAST:  150mL OMNIPAQUE IOHEXOL 350 MG/ML SOLN COMPARISON:  Radiograph yesterday. FINDINGS: Cardiovascular: There are no filling defects within the pulmonary arteries to suggest pulmonary embolus. Calcified noncalcified atheromatous plaque throughout the thoracic aorta. No aortic dissection. Conventional branching pattern from the aortic arch. Heart is normal in size. No pericardial effusion. Mediastinum/Nodes: No enlarged mediastinal or hilar lymph nodes. No thyroid nodule. Small hiatal hernia. Lungs/Pleura: Moderate emphysema. Mild heterogeneous ground-glass opacities in the periphery of both lower  lobes and right middle lobe. Minimal retained mucus/debris in the trachea and right mainstem bronchus. Lower lobe bronchial thickening. No pleural fluid. No pulmonary edema. No evidence of pulmonary mass. Upper Abdomen: Small hiatal hernia. No acute findings. Musculoskeletal: Mild anterior wedging of T11 vertebra, chronic when compared with 11/18/2018 chest radiograph. Mild degenerative change in the spine. There are no acute or suspicious osseous abnormalities. Review of the MIP images confirms the above findings. IMPRESSION: 1. No pulmonary embolus. 2. Peripheral ground-glass opacities in both lower lobes and right middle lobe, most consistent with pneumonia, typical of COVID-19. 3. Emphysema with lower lobe bronchial thickening. Retained mucus/debris in the trachea and right mainstem bronchus. 4. Moderate calcified noncalcified irregular atheromatous plaque in the thoracic aorta. No acute aortic abnormality. 5. Small hiatal hernia. Aortic Atherosclerosis  (ICD10-I70.0) and Emphysema (ICD10-J43.9). Electronically Signed   By: Keith Rake M.D.   On: 12/03/2019 00:39   DG Chest Port 1 View  Result Date: 12/01/2019 CLINICAL DATA:  Cough and shortness of breath COVID-19 virus infection EXAM: PORTABLE CHEST 1 VIEW COMPARISON:  11/30/2019 FINDINGS: The heart size and mediastinal contours are within normal limits. Patchy airspace disease is seen in the peripheral lung bases bilaterally, with mild worsening since previous study. Pulmonary emphysema also noted. No evidence of pleural effusion. IMPRESSION: 1. Mild worsening of peripheral bibasilar airspace disease since prior study. 2. Emphysema. Electronically Signed   By: Marlaine Hind M.D.   On: 12/01/2019 18:59      Subjective: No shortness of breath.  No wheezing, no vomiting.  Discharge Exam: Vitals:   12/03/19 0600 12/03/19 0700 12/03/19 0919 12/03/19 1037  BP: 117/73 116/69  121/77  Pulse: (!) 105 99 (!) 114 (!) 105  Resp: 17   19  Temp: 98.9 F (37.2 C)     TempSrc: Oral     SpO2: 94% (!) 88% 92% 91%  Weight:      Height:        General: Pt is alert, awake, not in acute distress Cardiovascular: RRR, S1/S2 +, no rubs, no gallops Respiratory: CTA bilaterally, no wheezing, no rhonchi Abdominal: Soft, NT, ND, bowel sounds + Extremities: no edema, no cyanosis    The results of significant diagnostics from this hospitalization (including imaging, microbiology, ancillary and laboratory) are listed below for reference.     Microbiology: Recent Results (from the past 240 hour(s))  Blood Culture (routine x 2)     Status: None (Preliminary result)   Collection Time: 12/01/19  7:27 PM   Specimen: Right Antecubital; Blood  Result Value Ref Range Status   Specimen Description RIGHT ANTECUBITAL  Final   Special Requests   Final    BOTTLES DRAWN AEROBIC AND ANAEROBIC Blood Culture adequate volume   Culture   Final    NO GROWTH 2 DAYS Performed at [facility], [address],  [place], [place] [address]    Report Status PENDING  Incomplete  Blood Culture (routine x 2)     Status: None (Preliminary result)   Collection Time: 12/01/19  7:28 PM   Specimen: BLOOD RIGHT HAND  Result Value Ref Range Status   Specimen Description BLOOD RIGHT HAND  Final   Special Requests   Final    BOTTLES DRAWN AEROBIC AND ANAEROBIC Blood Culture adequate volume   Culture   Final    NO GROWTH 2 DAYS Performed at [facility], [address], [place], [place] [address]    Report Status PENDING  Incomplete  Respiratory Panel by RT  PCR (Flu A&B, Covid) - Nasopharyngeal Swab     Status: Abnormal   Collection Time: 12/02/19  1:58 PM   Specimen: Nasopharyngeal Swab  Result Value Ref Range Status   SARS Coronavirus 2 by RT PCR POSITIVE (A) NEGATIVE Final    Comment: RESULT CALLED TO, READ BACK BY AND VERIFIED WITH: [place],C AT 1514 ON 123020 BY THOMPSON S.    Influenza A by PCR NEGATIVE NEGATIVE Final   Influenza B by PCR NEGATIVE NEGATIVE Final    Comment: (NOTE) The Xpert Xpress SARS-CoV-2/FLU/RSV assay is intended as an aid in  the diagnosis of influenza from Nasopharyngeal swab specimens and  should not be used as a sole basis for treatment. Nasal washings and  aspirates are unacceptable for Xpert Xpress SARS-CoV-2/FLU/RSV  testing. Fact Sheet for Patients: PinkCheek.be Fact Sheet for Healthcare Providers: GravelBags.it This test is not yet approved or cleared by the [place] FDA and  has been authorized for detection and/or diagnosis of SARS-CoV-2 by  FDA under an Emergency Use Authorization (EUA). This EUA will remain  in effect (meaning this test can be used) for the duration of the  Covid-19 declaration under Section 564(b)(1) of the Act, 21  U.S.C. section 360bbb-3(b)(1), unless the authorization is  terminated or revoked. Performed at [facility], [address], [place], [place] [address]       Labs: BNP (last 3 results) No results for input(s): BNP in the last 8760 hours. Basic Metabolic Panel: Recent Labs  Lab 12/01/19 1927 12/03/19 0353  NA 139 140  K 4.1 4.0  CL 100 100  CO2 29 28  GLUCOSE 99 154*  BUN 8 16  CREATININE 0.74 0.67  CALCIUM 9.1 9.1   Liver Function Tests: Recent Labs  Lab 12/01/19 1927 12/03/19 0353  AST 23 19  ALT 28 22  ALKPHOS 128* 104  BILITOT 0.8 0.8  PROT 7.8 7.1  ALBUMIN 3.4* 3.1*   No results for input(s): LIPASE, AMYLASE in the last 168 hours. No results for input(s): AMMONIA in the last 168 hours. CBC: Recent Labs  Lab 12/01/19 1927 12/03/19 0353  WBC 6.8 7.0  NEUTROABS 3.1 5.4  HGB 14.5 14.0  HCT 43.2 42.1  MCV 95.4 95.5  PLT 400 437*   Cardiac Enzymes: No results for input(s): CKTOTAL, CKMB, CKMBINDEX, TROPONINI in the last 168 hours. BNP: Invalid input(s): POCBNP CBG: No results for input(s): GLUCAP in the last 168 hours. D-Dimer Recent Labs    12/02/19 0415 12/03/19 0353  DDIMER 1.44* 1.15*   Hgb A1c No results for input(s): HGBA1C in the last 72 hours. Lipid Profile Recent Labs    12/01/19 1928  TRIG 243*   Thyroid function studies No results for input(s): TSH, T4TOTAL, T3FREE, THYROIDAB in the last 72 hours.  Invalid input(s): FREET3 Anemia work up Recent Labs    12/02/19 0415 12/03/19 0353  FERRITIN 304 259   Urinalysis    Component Value Date/Time   COLORURINE YELLOW 04/29/2015 2123   APPEARANCEUR CLEAR 04/29/2015 2123   LABSPEC 1.010 04/29/2015 2123   PHURINE 6.0 04/29/2015 2123   GLUCOSEU NEGATIVE 04/29/2015 2123   HGBUR NEGATIVE 04/29/2015 2123   BILIRUBINUR NEGATIVE 04/29/2015 2123   KETONESUR TRACE (A) 04/29/2015 2123   PROTEINUR NEGATIVE 04/29/2015 2123   UROBILINOGEN 0.2 04/29/2015 2123   NITRITE NEGATIVE 04/29/2015 2123   LEUKOCYTESUR NEGATIVE 04/29/2015 2123   Sepsis Labs Invalid input(s): PROCALCITONIN,  WBC,  LACTICIDVEN Microbiology Recent Results (from the past  240 hour(s))  Blood  Culture (routine x 2)     Status: None (Preliminary result)   Collection Time: 12/01/19  7:27 PM   Specimen: Right Antecubital; Blood  Result Value Ref Range Status   Specimen Description RIGHT ANTECUBITAL  Final   Special Requests   Final    BOTTLES DRAWN AEROBIC AND ANAEROBIC Blood Culture adequate volume   Culture   Final    NO GROWTH 2 DAYS Performed at [facility], [address], [place], [place] [address]    Report Status PENDING  Incomplete  Blood Culture (routine x 2)     Status: None (Preliminary result)   Collection Time: 12/01/19  7:28 PM   Specimen: BLOOD RIGHT HAND  Result Value Ref Range Status   Specimen Description BLOOD RIGHT HAND  Final   Special Requests   Final    BOTTLES DRAWN AEROBIC AND ANAEROBIC Blood Culture adequate volume   Culture   Final    NO GROWTH 2 DAYS Performed at [facility], [address] [place], [place] [address]    Report Status PENDING  Incomplete  Respiratory Panel by RT PCR (Flu A&B, Covid) - Nasopharyngeal Swab     Status: Abnormal   Collection Time: 12/02/19  1:58 PM   Specimen: Nasopharyngeal Swab  Result Value Ref Range Status   SARS Coronavirus 2 by RT PCR POSITIVE (A) NEGATIVE Final    Comment: RESULT CALLED TO, READ BACK BY AND VERIFIED WITH: [place],C AT 1856 ON [id number] BY THOMPSON S.    Influenza A by PCR NEGATIVE NEGATIVE Final   Influenza B by PCR NEGATIVE NEGATIVE Final    Comment: (NOTE) The Xpert Xpress SARS-CoV-2/FLU/RSV assay is intended as an aid in  the diagnosis of influenza from Nasopharyngeal swab specimens and  should not be used as a sole basis for treatment. Nasal washings and  aspirates are unacceptable for Xpert Xpress SARS-CoV-2/FLU/RSV  testing. Fact Sheet for Patients: PinkCheek.be Fact Sheet for Healthcare Providers: GravelBags.it This test is not yet approved or cleared by the [place] FDA and  has been  authorized for detection and/or diagnosis of SARS-CoV-2 by  FDA under an Emergency Use Authorization (EUA). This EUA will remain  in effect (meaning this test can be used) for the duration of the  Covid-19 declaration under Section 564(b)(1) of the Act, 21  U.S.C. section 360bbb-3(b)(1), unless the authorization is  terminated or revoked. Performed at [facility], [address], [place], [place] [address]      Time coordinating discharge: 47mins  SIGNED:   Kathie Dike, MD  [facility] 12/03/2019, 11:13 AM   If 7PM-7AM, please contact night-coverage [email or web address]

## 2019-12-04 DIAGNOSIS — J1282 Pneumonia due to coronavirus disease 2019: Secondary | ICD-10-CM

## 2019-12-04 LAB — COMPREHENSIVE METABOLIC PANEL
ALT: 20 U/L (ref 0–44)
AST: 21 U/L (ref 15–41)
Albumin: 3.3 g/dL — ABNORMAL LOW (ref 3.5–5.0)
Alkaline Phosphatase: 92 U/L (ref 38–126)
Anion gap: 10 (ref 5–15)
BUN: 20 mg/dL (ref 8–23)
CO2: 28 mmol/L (ref 22–32)
Calcium: 8.7 mg/dL — ABNORMAL LOW (ref 8.9–10.3)
Chloride: 100 mmol/L (ref 98–111)
Creatinine, Ser: 0.62 mg/dL (ref 0.61–1.24)
GFR calc Af Amer: >60 mL/min (ref >60)
GFR calc non Af Amer: >60 mL/min (ref >60)
Glucose, Bld: 119 mg/dL — ABNORMAL HIGH (ref 70–99)
Potassium: 3.6 mmol/L (ref 3.5–5.1)
Sodium: 138 mmol/L (ref 135–145)
Total Bilirubin: 0.8 mg/dL (ref 0.3–1.2)
Total Protein: 7 g/dL (ref 6.5–8.1)

## 2019-12-04 LAB — FERRITIN: Ferritin: 229 ng/mL (ref 24–336)

## 2019-12-04 LAB — CBC WITH DIFFERENTIAL/PLATELET
Abs Immature Granulocytes: 0.05 10*3/uL (ref 0.00–0.07)
Basophils Absolute: 0 10*3/uL (ref 0.0–0.1)
Basophils Relative: 0 %
Eosinophils Absolute: 0 10*3/uL (ref 0.0–0.5)
Eosinophils Relative: 0 %
HCT: 43.5 % (ref 39.0–52.0)
Hemoglobin: 14.6 g/dL (ref 13.0–17.0)
Immature Granulocytes: 0 %
Lymphocytes Relative: 20 %
Lymphs Abs: 2.4 10*3/uL (ref 0.7–4.0)
MCH: 31.7 pg (ref 26.0–34.0)
MCHC: 33.6 g/dL (ref 30.0–36.0)
MCV: 94.6 fL (ref 80.0–100.0)
Monocytes Absolute: 1.4 10*3/uL — ABNORMAL HIGH (ref 0.1–1.0)
Monocytes Relative: 12 %
Neutro Abs: 8.3 10*3/uL — ABNORMAL HIGH (ref 1.7–7.7)
Neutrophils Relative %: 68 %
Platelets: 442 10*3/uL — ABNORMAL HIGH (ref 150–400)
RBC: 4.6 MIL/uL (ref 4.22–5.81)
RDW: 12.4 % (ref 11.5–15.5)
WBC: 12.1 10*3/uL — ABNORMAL HIGH (ref 4.0–10.5)
nRBC: 0 % (ref 0.0–0.2)

## 2019-12-04 LAB — C-REACTIVE PROTEIN: CRP: 0.7 mg/dL (ref <1.0)

## 2019-12-04 LAB — D-DIMER, QUANTITATIVE: D-Dimer, Quant: 1.19 ug/mL-FEU — ABNORMAL HIGH (ref 0.00–0.50)

## 2019-12-04 NOTE — Progress Notes (Signed)
PROGRESS NOTE  [name] AYT:[id number] DOB: 20-Jul-1951 DOA: 12/01/2019 PCP: [name], MD  Brief History:  69 year old male with a history of COPD, SVT, seizure, GERD presenting from [facility]F secondary to shortness of breath and hypoxia for 1 to 2 days.  The patient was tested positive for Covid on 11/15/2019.  Apparently, the patient had oxygen saturation of 88% on room air during an urgent care visit on the day prior to admission.  In the emergency department, the patient's oxygen saturation was in the upper 80s.  He was placed on 2 L nasal cannula with improvement to 93-95% range.  The patient was started on steroids and remdesivir.  The patient improved clinically.  His inflammatory markers gradually improved.  The patient was planned for discharge on 12/03/2019 with plans for steroids for 8 additional days.  The patient desaturated with ambulation on room air.  Home oxygen was arranged.  However, due to transportation issues, his discharge was delayed on 12/03/2019.  Overnight into 12/04/2019, the patient did not have any issues.  He continued to ambulate around the unit without any worsening shortness of breath.  He states that his breathing has gradually improved.  He denied any new symptoms including fevers, chills, chest pain, nausea, vomiting, diarrhea.  Subsequently, the patient was discharged in stable condition on 12/04/2019 with plans for several more days of steroids after discharge.  Assessment/Plan: Acute respiratory failure with hypoxia secondary to COVID-19 pneumonia -Secondary to COVID-19 pneumonia -Desaturated with ambulation on room air -Patient will be set up with home oxygen at 2 L -CRP 5.7>>> 0.7 -Ferritin 305>> 259 -D-dimer 1.15>>> 1.19 -CTA chest negative for PE  COPD exacerbation -Discharge back to ALF with prednisone as discussed above -Stable on 2 L nasal cannula     Disposition Plan:   Stable for discharge  12/04/19       Subjective: Patient denies fevers, chills, headache, chest pain, dyspnea, nausea, vomiting, diarrhea, abdominal pain, dysuria  Objective: Vitals:   12/03/19 1230 12/03/19 1245 12/03/19 1300 12/03/19 1640  BP: 119/73  118/70 129/84  Pulse: 98 96 (!) 107 (!) 102  Resp:    14  Temp:    98.7 F (37.1 C)  TempSrc:    Oral  SpO2: 95% 95% 95% 95%  Weight:      Height:        Intake/Output Summary (Last 24 hours) at 12/04/2019 0743 Last data filed at 12/03/2019 1955 Gross per 24 hour  Intake 100.32 ml  Output --  Net 100.32 ml   Weight change:  Exam:   General:  Pt is alert, follows commands appropriately, not in acute distress  HEENT: No icterus, No thrush, No neck mass, [place]/AT  Cardiovascular: RRR, S1/S2, no rubs, no gallops  Respiratory: bibasilar rales. Minimal basilar wheeze.  Good air movement.  Abdomen: Soft/+BS, non tender, non distended, no guarding  Extremities: No edema, No lymphangitis, No petechiae, No rashes, no synovitis   Data Reviewed: I have personally reviewed following labs and imaging studies Basic Metabolic Panel: Recent Labs  Lab 12/01/19 1927 12/03/19 0353 12/04/19 0402  NA 139 140 138  K 4.1 4.0 3.6  CL 100 100 100  CO2 29 28 28   GLUCOSE 99 154* 119*  BUN 8 16 20   CREATININE 0.74 0.67 0.62  CALCIUM 9.1 9.1 8.7*   Liver Function Tests: Recent Labs  Lab 12/01/19 1927 12/03/19 0353 12/04/19 0402  AST 23 19  21  ALT 28 22 20   ALKPHOS 128* 104 92  BILITOT 0.8 0.8 0.8  PROT 7.8 7.1 7.0  ALBUMIN 3.4* 3.1* 3.3*   No results for input(s): LIPASE, AMYLASE in the last 168 hours. No results for input(s): AMMONIA in the last 168 hours. Coagulation Profile: No results for input(s): INR, PROTIME in the last 168 hours. CBC: Recent Labs  Lab 12/01/19 1927 12/03/19 0353 12/04/19 0402  WBC 6.8 7.0 12.1*  NEUTROABS 3.1 5.4 8.3*  HGB 14.5 14.0 14.6  HCT 43.2 42.1 43.5  MCV 95.4 95.5 94.6  PLT 400 437* 442*   Cardiac  Enzymes: No results for input(s): CKTOTAL, CKMB, CKMBINDEX, TROPONINI in the last 168 hours. BNP: Invalid input(s): POCBNP CBG: No results for input(s): GLUCAP in the last 168 hours. HbA1C: No results for input(s): HGBA1C in the last 72 hours. Urine analysis:    Component Value Date/Time   COLORURINE YELLOW 04/29/2015 2123   APPEARANCEUR CLEAR 04/29/2015 2123   LABSPEC 1.010 04/29/2015 2123   PHURINE 6.0 04/29/2015 2123   GLUCOSEU NEGATIVE 04/29/2015 2123   HGBUR NEGATIVE 04/29/2015 2123   BILIRUBINUR NEGATIVE 04/29/2015 2123   KETONESUR TRACE (A) 04/29/2015 2123   PROTEINUR NEGATIVE 04/29/2015 2123   UROBILINOGEN 0.2 04/29/2015 2123   NITRITE NEGATIVE 04/29/2015 2123   LEUKOCYTESUR NEGATIVE 04/29/2015 2123   Sepsis Labs: @LABRCNTIP (procalcitonin:4,lacticidven:4) ) Recent Results (from the past 240 hour(s))  Blood Culture (routine x 2)     Status: None (Preliminary result)   Collection Time: 12/01/19  7:27 PM   Specimen: Right Antecubital; Blood  Result Value Ref Range Status   Specimen Description RIGHT ANTECUBITAL  Final   Special Requests   Final    BOTTLES DRAWN AEROBIC AND ANAEROBIC Blood Culture adequate volume   Culture   Final    NO GROWTH 2 DAYS Performed at [facility], [address] [place], [place] [address]    Report Status PENDING  Incomplete  Blood Culture (routine x 2)     Status: None (Preliminary result)   Collection Time: 12/01/19  7:28 PM   Specimen: BLOOD RIGHT HAND  Result Value Ref Range Status   Specimen Description BLOOD RIGHT HAND  Final   Special Requests   Final    BOTTLES DRAWN AEROBIC AND ANAEROBIC Blood Culture adequate volume   Culture   Final    NO GROWTH 2 DAYS Performed at [facility], [address], [place], [place] [address]    Report Status PENDING  Incomplete  Respiratory Panel by RT PCR (Flu A&B, Covid) - Nasopharyngeal Swab     Status: Abnormal   Collection Time: 12/02/19  1:58 PM   Specimen: Nasopharyngeal Swab   Result Value Ref Range Status   SARS Coronavirus 2 by RT PCR POSITIVE (A) NEGATIVE Final    Comment: RESULT CALLED TO, READ BACK BY AND VERIFIED WITH: [place],C AT 6144 ON 123020 BY THOMPSON S.    Influenza A by PCR NEGATIVE NEGATIVE Final   Influenza B by PCR NEGATIVE NEGATIVE Final    Comment: (NOTE) The Xpert Xpress SARS-CoV-2/FLU/RSV assay is intended as an aid in  the diagnosis of influenza from Nasopharyngeal swab specimens and  should not be used as a sole basis for treatment. Nasal washings and  aspirates are unacceptable for Xpert Xpress SARS-CoV-2/FLU/RSV  testing. Fact Sheet for Patients: PinkCheek.be Fact Sheet for Healthcare Providers: GravelBags.it This test is not yet approved or cleared by the [place] FDA and  has been authorized for detection and/or  diagnosis of SARS-CoV-2 by  FDA under an Emergency Use Authorization (EUA). This EUA will remain  in effect (meaning this test can be used) for the duration of the  Covid-19 declaration under Section 564(b)(1) of the Act, 21  U.S.C. section 360bbb-3(b)(1), unless the authorization is  terminated or revoked. Performed at [facility], [address] [place], [place] [address]      Scheduled Meds: . albuterol  2 puff Inhalation Q6H  . vitamin C  500 mg Oral Daily  . aspirin EC  81 mg Oral Daily  . enoxaparin (LOVENOX) injection  40 mg Subcutaneous Q24H  . methylPREDNISolone (SOLU-MEDROL) injection  0.5 mg/kg Intravenous Q12H  . mometasone-formoterol  2 puff Inhalation BID  . nortriptyline  100 mg Oral QHS  . pantoprazole  40 mg Oral Daily  . zinc sulfate  220 mg Oral Daily   Continuous Infusions: . remdesivir 100 mg in NS 100 mL Stopped (12/03/19 1955)    Procedures/Studies: DG Chest 2 View  Result Date: 11/30/2019 CLINICAL DATA:  Patient has cough, sob, abdominal pain and nausea x 3 days. Positive CoVID test. Current smoker, COPD. shortness  of breath EXAM: CHEST - 2 VIEW COMPARISON:  Radiograph 11/18/2018 FINDINGS: Normal cardiac silhouette. There is patchy airspace densities along the RIGHT lateral chest wall. Lateral projection place disease in the RIGHT middle lobe. Lungs are hyperinflated. No acute osseous findings. IMPRESSION: Nodular densities in the periphery of the RIGHT middle lobe. Differential would include bronchopneumonia, viral pneumonia (COVID) or pulmonary neoplasm. Favor COVID pneumonia in light of positive COVID test. Recommend follow-up radiographs to ensure resolution and exclude neoplasm. Electronically Signed   By: Suzy Bouchard M.D.   On: 11/30/2019 17:47   CT ANGIO CHEST PE W OR WO CONTRAST  Result Date: 12/03/2019 CLINICAL DATA:  Shortness of breath. Recent COVID-19 diagnosis 11/15/2019. Progressive hypoxia. EXAM: CT ANGIOGRAPHY CHEST WITH CONTRAST TECHNIQUE: Multidetector CT imaging of the chest was performed using the standard protocol during bolus administration of intravenous contrast. Multiplanar CT image reconstructions and MIPs were obtained to evaluate the vascular anatomy. CONTRAST:  144mL OMNIPAQUE IOHEXOL 350 MG/ML SOLN COMPARISON:  Radiograph yesterday. FINDINGS: Cardiovascular: There are no filling defects within the pulmonary arteries to suggest pulmonary embolus. Calcified noncalcified atheromatous plaque throughout the thoracic aorta. No aortic dissection. Conventional branching pattern from the aortic arch. Heart is normal in size. No pericardial effusion. Mediastinum/Nodes: No enlarged mediastinal or hilar lymph nodes. No thyroid nodule. Small hiatal hernia. Lungs/Pleura: Moderate emphysema. Mild heterogeneous ground-glass opacities in the periphery of both lower lobes and right middle lobe. Minimal retained mucus/debris in the trachea and right mainstem bronchus. Lower lobe bronchial thickening. No pleural fluid. No pulmonary edema. No evidence of pulmonary mass. Upper Abdomen: Small hiatal hernia. No  acute findings. Musculoskeletal: Mild anterior wedging of T11 vertebra, chronic when compared with 11/18/2018 chest radiograph. Mild degenerative change in the spine. There are no acute or suspicious osseous abnormalities. Review of the MIP images confirms the above findings. IMPRESSION: 1. No pulmonary embolus. 2. Peripheral ground-glass opacities in both lower lobes and right middle lobe, most consistent with pneumonia, typical of COVID-19. 3. Emphysema with lower lobe bronchial thickening. Retained mucus/debris in the trachea and right mainstem bronchus. 4. Moderate calcified noncalcified irregular atheromatous plaque in the thoracic aorta. No acute aortic abnormality. 5. Small hiatal hernia. Aortic Atherosclerosis (ICD10-I70.0) and Emphysema (ICD10-J43.9). Electronically Signed   By: Keith Rake M.D.   On: 12/03/2019 00:39   DG Chest [facility] 1 View  Result  Date: 12/01/2019 CLINICAL DATA:  Cough and shortness of breath COVID-19 virus infection EXAM: PORTABLE CHEST 1 VIEW COMPARISON:  11/30/2019 FINDINGS: The heart size and mediastinal contours are within normal limits. Patchy airspace disease is seen in the peripheral lung bases bilaterally, with mild worsening since previous study. Pulmonary emphysema also noted. No evidence of pleural effusion. IMPRESSION: 1. Mild worsening of peripheral bibasilar airspace disease since prior study. 2. Emphysema. Electronically Signed   By: Marlaine Hind M.D.   On: 12/01/2019 18:59    Orson Eva, DO  [facility] Pager [phone]  If 7PM-7AM, please contact night-coverage [email or web address] Password [email or web address] 12/04/2019, 7:43 AM   LOS: 2 days

## 2019-12-04 NOTE — ED Notes (Signed)
Pt given food

## 2019-12-07 ENCOUNTER — Telehealth: Payer: Self-pay | Admitting: Family Medicine

## 2019-12-07 LAB — CULTURE, BLOOD (ROUTINE X 2)
Culture: NO GROWTH
Culture: NO GROWTH
Special Requests: ADEQUATE
Special Requests: ADEQUATE

## 2019-12-07 NOTE — Telephone Encounter (Signed)
Nurses patient recently discharged from hospital due to pneumonia secondary to Covid Please touch base with [facility] It was recommended that the patient do a follow-up visit with [place] in 7 to 14 days. This can be in person or virtual If patient having any trouble currently we will need to know thank you

## 2019-12-07 NOTE — Telephone Encounter (Signed)
[facility] contacted and transferred up front to schedule appt.

## 2019-12-09 ENCOUNTER — Telehealth: Payer: Self-pay | Admitting: Family Medicine

## 2019-12-09 DIAGNOSIS — Z79891 Long term (current) use of opiate analgesic: Secondary | ICD-10-CM | POA: Diagnosis not present

## 2019-12-09 DIAGNOSIS — G40909 Epilepsy, unspecified, not intractable, without status epilepticus: Secondary | ICD-10-CM | POA: Diagnosis not present

## 2019-12-09 DIAGNOSIS — R531 Weakness: Secondary | ICD-10-CM | POA: Diagnosis not present

## 2019-12-09 DIAGNOSIS — J9601 Acute respiratory failure with hypoxia: Secondary | ICD-10-CM | POA: Diagnosis not present

## 2019-12-09 DIAGNOSIS — Z89511 Acquired absence of right leg below knee: Secondary | ICD-10-CM | POA: Diagnosis not present

## 2019-12-09 DIAGNOSIS — I471 Supraventricular tachycardia: Secondary | ICD-10-CM | POA: Diagnosis not present

## 2019-12-09 DIAGNOSIS — F411 Generalized anxiety disorder: Secondary | ICD-10-CM | POA: Diagnosis not present

## 2019-12-09 DIAGNOSIS — M545 Low back pain: Secondary | ICD-10-CM | POA: Diagnosis not present

## 2019-12-09 DIAGNOSIS — U071 COVID-19: Secondary | ICD-10-CM | POA: Diagnosis not present

## 2019-12-09 DIAGNOSIS — J1282 Pneumonia due to coronavirus disease 2019: Secondary | ICD-10-CM | POA: Diagnosis not present

## 2019-12-09 DIAGNOSIS — J45901 Unspecified asthma with (acute) exacerbation: Secondary | ICD-10-CM | POA: Diagnosis not present

## 2019-12-09 DIAGNOSIS — J449 Chronic obstructive pulmonary disease, unspecified: Secondary | ICD-10-CM | POA: Diagnosis not present

## 2019-12-09 DIAGNOSIS — Z9981 Dependence on supplemental oxygen: Secondary | ICD-10-CM | POA: Diagnosis not present

## 2019-12-09 DIAGNOSIS — F329 Major depressive disorder, single episode, unspecified: Secondary | ICD-10-CM | POA: Diagnosis not present

## 2019-12-09 NOTE — Telephone Encounter (Signed)
May have referral for this thank you

## 2019-12-09 NOTE — Telephone Encounter (Signed)
Please advise. Thank you

## 2019-12-09 NOTE — Telephone Encounter (Signed)
Contacted [facility] Sous[facility] [phone]) and gave verbal referral/orders.

## 2019-12-09 NOTE — Telephone Encounter (Signed)
[facility] nurse needs order to see patient in care facility for physical therapy. She doesn't know how long yet. Loma Sousa [phone]

## 2019-12-11 DIAGNOSIS — R531 Weakness: Secondary | ICD-10-CM | POA: Diagnosis not present

## 2019-12-11 DIAGNOSIS — J1282 Pneumonia due to coronavirus disease 2019: Secondary | ICD-10-CM | POA: Diagnosis not present

## 2019-12-11 DIAGNOSIS — J449 Chronic obstructive pulmonary disease, unspecified: Secondary | ICD-10-CM | POA: Diagnosis not present

## 2019-12-11 DIAGNOSIS — J9601 Acute respiratory failure with hypoxia: Secondary | ICD-10-CM | POA: Diagnosis not present

## 2019-12-11 DIAGNOSIS — U071 COVID-19: Secondary | ICD-10-CM | POA: Diagnosis not present

## 2019-12-11 DIAGNOSIS — J45901 Unspecified asthma with (acute) exacerbation: Secondary | ICD-10-CM | POA: Diagnosis not present

## 2019-12-14 ENCOUNTER — Other Ambulatory Visit: Payer: Self-pay | Admitting: Internal Medicine

## 2019-12-14 DIAGNOSIS — J1282 Pneumonia due to coronavirus disease 2019: Secondary | ICD-10-CM | POA: Diagnosis not present

## 2019-12-14 DIAGNOSIS — R531 Weakness: Secondary | ICD-10-CM | POA: Diagnosis not present

## 2019-12-14 DIAGNOSIS — U071 COVID-19: Secondary | ICD-10-CM

## 2019-12-14 DIAGNOSIS — J9601 Acute respiratory failure with hypoxia: Secondary | ICD-10-CM | POA: Diagnosis not present

## 2019-12-14 DIAGNOSIS — J45901 Unspecified asthma with (acute) exacerbation: Secondary | ICD-10-CM | POA: Diagnosis not present

## 2019-12-14 DIAGNOSIS — J449 Chronic obstructive pulmonary disease, unspecified: Secondary | ICD-10-CM | POA: Diagnosis not present

## 2019-12-15 ENCOUNTER — Ambulatory Visit (INDEPENDENT_AMBULATORY_CARE_PROVIDER_SITE_OTHER): Payer: Medicare Other | Admitting: Family Medicine

## 2019-12-15 DIAGNOSIS — U071 COVID-19: Secondary | ICD-10-CM | POA: Diagnosis not present

## 2019-12-15 DIAGNOSIS — J9601 Acute respiratory failure with hypoxia: Secondary | ICD-10-CM | POA: Diagnosis not present

## 2019-12-15 DIAGNOSIS — J449 Chronic obstructive pulmonary disease, unspecified: Secondary | ICD-10-CM

## 2019-12-15 DIAGNOSIS — R531 Weakness: Secondary | ICD-10-CM | POA: Diagnosis not present

## 2019-12-15 DIAGNOSIS — E8809 Other disorders of plasma-protein metabolism, not elsewhere classified: Secondary | ICD-10-CM

## 2019-12-15 DIAGNOSIS — J45901 Unspecified asthma with (acute) exacerbation: Secondary | ICD-10-CM | POA: Diagnosis not present

## 2019-12-15 DIAGNOSIS — J1282 Pneumonia due to coronavirus disease 2019: Secondary | ICD-10-CM | POA: Diagnosis not present

## 2019-12-15 NOTE — Progress Notes (Signed)
   Subjective:    Patient ID: [name], male    DOB: 18-Nov-1951, 69 y.o.   MRN: [id number]  HPI Pt went to ER on 12/01/2019. Pt has not been wearing oxygen like he suppose to, 95% room air and did go up once oxygen was put back on. No cough. Pt did states that his body was aching a little more. Pt told nursing home staff that he did feel better. Pt was place on Prednisone for 8 days.  Slyvia from [facility] states that he is almost done with the meds from ER.  Patient was admitted into the hospital with pneumonia as a result of Covid but he was outside the window of using remdesivir patient was sent home on oxygen his O2 sat on room air stays around 95% but when he does get up and move around he will get short of breath and will drop down toward 90% patient relates energy level is improving he is doing better with eating staying away from smoking Virtual Visit via Telephone Note  I connected with [name] on 12/15/19 at  8:30 AM EST by telephone and verified that I am speaking with the correct person using two identifiers.  Location: Patient: home Provider: office   I discussed the limitations, risks, security and privacy concerns of performing an evaluation and management service by telephone and the availability of in person appointments. I also discussed with the patient that there may be a patient responsible charge related to this service. The patient expressed understanding and agreed to proceed.   History of Present Illness:    Observations/Objective:   Assessment and Plan:   Follow Up Instructions:    I discussed the assessment and treatment plan with the patient. The patient was provided an opportunity to ask questions and all were answered. The patient agreed with the plan and demonstrated an understanding of the instructions.   The patient was advised to call back or seek an in-person evaluation if the symptoms worsen or if the condition fails to improve as  anticipated.  I provided 16 minutes of non-face-to-face time during this encounter.   Vicente Males, LPN    Review of Systems  Constitutional: Negative for activity change.  HENT: Negative for congestion and rhinorrhea.   Respiratory: Positive for shortness of breath. Negative for cough.   Cardiovascular: Negative for chest pain.  Gastrointestinal: Negative for abdominal pain, diarrhea, nausea and vomiting.  Genitourinary: Negative for dysuria and hematuria.  Neurological: Negative for weakness and headaches.  Psychiatric/Behavioral: Negative for behavioral problems and confusion.  Shortness of breath with activity not at rest     Objective:   Physical Exam   Today's visit was via telephone Physical exam was not possible for this visit      Assessment & Plan:  Gradually improving Getting past his pneumonia Has underlying COPD On 2 L of O2 per nasal cannula Will need to have CBC and CMP along with a chest x-ray by the end of the month Follow-up visit in 1 month if any setbacks to notify us

## 2019-12-15 NOTE — Addendum Note (Signed)
Addended by: Vicente Males on: 12/15/2019 01:39 PM   Modules accepted: Orders

## 2019-12-16 ENCOUNTER — Other Ambulatory Visit: Payer: Self-pay | Admitting: Family Medicine

## 2019-12-16 DIAGNOSIS — J9601 Acute respiratory failure with hypoxia: Secondary | ICD-10-CM | POA: Diagnosis not present

## 2019-12-16 DIAGNOSIS — U071 COVID-19: Secondary | ICD-10-CM | POA: Diagnosis not present

## 2019-12-16 DIAGNOSIS — J1282 Pneumonia due to coronavirus disease 2019: Secondary | ICD-10-CM | POA: Diagnosis not present

## 2019-12-16 DIAGNOSIS — R531 Weakness: Secondary | ICD-10-CM | POA: Diagnosis not present

## 2019-12-16 DIAGNOSIS — J45901 Unspecified asthma with (acute) exacerbation: Secondary | ICD-10-CM | POA: Diagnosis not present

## 2019-12-16 DIAGNOSIS — J449 Chronic obstructive pulmonary disease, unspecified: Secondary | ICD-10-CM | POA: Diagnosis not present

## 2019-12-17 DIAGNOSIS — J45901 Unspecified asthma with (acute) exacerbation: Secondary | ICD-10-CM | POA: Diagnosis not present

## 2019-12-17 DIAGNOSIS — J449 Chronic obstructive pulmonary disease, unspecified: Secondary | ICD-10-CM | POA: Diagnosis not present

## 2019-12-17 DIAGNOSIS — U071 COVID-19: Secondary | ICD-10-CM | POA: Diagnosis not present

## 2019-12-17 DIAGNOSIS — J9601 Acute respiratory failure with hypoxia: Secondary | ICD-10-CM | POA: Diagnosis not present

## 2019-12-17 DIAGNOSIS — J1282 Pneumonia due to coronavirus disease 2019: Secondary | ICD-10-CM | POA: Diagnosis not present

## 2019-12-17 DIAGNOSIS — R531 Weakness: Secondary | ICD-10-CM | POA: Diagnosis not present

## 2019-12-17 NOTE — Telephone Encounter (Signed)
Last med check 03/17/19

## 2019-12-18 ENCOUNTER — Telehealth: Payer: Self-pay | Admitting: Family Medicine

## 2019-12-18 DIAGNOSIS — R531 Weakness: Secondary | ICD-10-CM | POA: Diagnosis not present

## 2019-12-18 DIAGNOSIS — J45901 Unspecified asthma with (acute) exacerbation: Secondary | ICD-10-CM | POA: Diagnosis not present

## 2019-12-18 DIAGNOSIS — J1282 Pneumonia due to coronavirus disease 2019: Secondary | ICD-10-CM | POA: Diagnosis not present

## 2019-12-18 DIAGNOSIS — J9601 Acute respiratory failure with hypoxia: Secondary | ICD-10-CM | POA: Diagnosis not present

## 2019-12-18 DIAGNOSIS — U071 COVID-19: Secondary | ICD-10-CM | POA: Diagnosis not present

## 2019-12-18 DIAGNOSIS — J449 Chronic obstructive pulmonary disease, unspecified: Secondary | ICD-10-CM | POA: Diagnosis not present

## 2019-12-18 NOTE — Telephone Encounter (Signed)
Prescription faxed to [facility]. Left message to return call to notify patient.

## 2019-12-18 NOTE — Telephone Encounter (Signed)
Please go ahead with writing this prescription Diagnosis amputation of leg

## 2019-12-18 NOTE — Telephone Encounter (Signed)
Pt is needing compression stockings script sent to [facility] [phone] (phone number)

## 2019-12-21 ENCOUNTER — Telehealth: Payer: Self-pay | Admitting: Family Medicine

## 2019-12-21 DIAGNOSIS — J9601 Acute respiratory failure with hypoxia: Secondary | ICD-10-CM | POA: Diagnosis not present

## 2019-12-21 DIAGNOSIS — J449 Chronic obstructive pulmonary disease, unspecified: Secondary | ICD-10-CM | POA: Diagnosis not present

## 2019-12-21 DIAGNOSIS — J45901 Unspecified asthma with (acute) exacerbation: Secondary | ICD-10-CM | POA: Diagnosis not present

## 2019-12-21 DIAGNOSIS — R531 Weakness: Secondary | ICD-10-CM | POA: Diagnosis not present

## 2019-12-21 DIAGNOSIS — U071 COVID-19: Secondary | ICD-10-CM | POA: Diagnosis not present

## 2019-12-21 DIAGNOSIS — J1282 Pneumonia due to coronavirus disease 2019: Secondary | ICD-10-CM | POA: Diagnosis not present

## 2019-12-21 NOTE — Telephone Encounter (Signed)
May discontinue at this point

## 2019-12-21 NOTE — Telephone Encounter (Signed)
Fax from [facility] regarding Zinc Sulfate 220 mg  Take one capsule po once daily and Vit C 500 mg take one tablet po daily. Pharmacy is wanting to know if provider would like to continue or d/c theses medications since these are COVID 19 support medications. Please advise. Thank you

## 2019-12-21 NOTE — Telephone Encounter (Signed)
Called rx and gave a verbal order to d/c meds listed below.

## 2019-12-22 ENCOUNTER — Other Ambulatory Visit: Payer: Self-pay | Admitting: Family Medicine

## 2019-12-22 DIAGNOSIS — J45901 Unspecified asthma with (acute) exacerbation: Secondary | ICD-10-CM | POA: Diagnosis not present

## 2019-12-22 DIAGNOSIS — J1282 Pneumonia due to coronavirus disease 2019: Secondary | ICD-10-CM | POA: Diagnosis not present

## 2019-12-22 DIAGNOSIS — J449 Chronic obstructive pulmonary disease, unspecified: Secondary | ICD-10-CM | POA: Diagnosis not present

## 2019-12-22 DIAGNOSIS — U071 COVID-19: Secondary | ICD-10-CM | POA: Diagnosis not present

## 2019-12-22 DIAGNOSIS — R531 Weakness: Secondary | ICD-10-CM | POA: Diagnosis not present

## 2019-12-22 DIAGNOSIS — J9601 Acute respiratory failure with hypoxia: Secondary | ICD-10-CM | POA: Diagnosis not present

## 2019-12-23 DIAGNOSIS — H25813 Combined forms of age-related cataract, bilateral: Secondary | ICD-10-CM | POA: Diagnosis not present

## 2019-12-24 DIAGNOSIS — J1282 Pneumonia due to coronavirus disease 2019: Secondary | ICD-10-CM | POA: Diagnosis not present

## 2019-12-24 DIAGNOSIS — R531 Weakness: Secondary | ICD-10-CM | POA: Diagnosis not present

## 2019-12-24 DIAGNOSIS — J449 Chronic obstructive pulmonary disease, unspecified: Secondary | ICD-10-CM | POA: Diagnosis not present

## 2019-12-24 DIAGNOSIS — J9601 Acute respiratory failure with hypoxia: Secondary | ICD-10-CM | POA: Diagnosis not present

## 2019-12-24 DIAGNOSIS — U071 COVID-19: Secondary | ICD-10-CM | POA: Diagnosis not present

## 2019-12-24 DIAGNOSIS — J45901 Unspecified asthma with (acute) exacerbation: Secondary | ICD-10-CM | POA: Diagnosis not present

## 2019-12-25 DIAGNOSIS — U071 COVID-19: Secondary | ICD-10-CM | POA: Diagnosis not present

## 2019-12-25 DIAGNOSIS — R531 Weakness: Secondary | ICD-10-CM | POA: Diagnosis not present

## 2019-12-25 DIAGNOSIS — J1282 Pneumonia due to coronavirus disease 2019: Secondary | ICD-10-CM | POA: Diagnosis not present

## 2019-12-25 DIAGNOSIS — J449 Chronic obstructive pulmonary disease, unspecified: Secondary | ICD-10-CM | POA: Diagnosis not present

## 2019-12-25 DIAGNOSIS — J45901 Unspecified asthma with (acute) exacerbation: Secondary | ICD-10-CM | POA: Diagnosis not present

## 2019-12-25 DIAGNOSIS — J9601 Acute respiratory failure with hypoxia: Secondary | ICD-10-CM | POA: Diagnosis not present

## 2019-12-28 DIAGNOSIS — J449 Chronic obstructive pulmonary disease, unspecified: Secondary | ICD-10-CM | POA: Diagnosis not present

## 2019-12-28 DIAGNOSIS — J45901 Unspecified asthma with (acute) exacerbation: Secondary | ICD-10-CM | POA: Diagnosis not present

## 2019-12-28 DIAGNOSIS — R531 Weakness: Secondary | ICD-10-CM | POA: Diagnosis not present

## 2019-12-28 DIAGNOSIS — U071 COVID-19: Secondary | ICD-10-CM | POA: Diagnosis not present

## 2019-12-28 DIAGNOSIS — J9601 Acute respiratory failure with hypoxia: Secondary | ICD-10-CM | POA: Diagnosis not present

## 2019-12-28 DIAGNOSIS — J1282 Pneumonia due to coronavirus disease 2019: Secondary | ICD-10-CM | POA: Diagnosis not present

## 2019-12-29 ENCOUNTER — Encounter (HOSPITAL_COMMUNITY): Payer: Self-pay

## 2019-12-29 ENCOUNTER — Ambulatory Visit (HOSPITAL_COMMUNITY)
Admission: RE | Admit: 2019-12-29 | Discharge: 2019-12-29 | Disposition: A | Discharge status: Home / Self Care | Payer: Medicare Other | Source: Ambulatory Visit | Attending: Family Medicine | Admitting: Family Medicine

## 2019-12-29 ENCOUNTER — Other Ambulatory Visit: Payer: Self-pay

## 2019-12-29 DIAGNOSIS — J9601 Acute respiratory failure with hypoxia: Secondary | ICD-10-CM | POA: Diagnosis not present

## 2019-12-29 DIAGNOSIS — J45901 Unspecified asthma with (acute) exacerbation: Secondary | ICD-10-CM | POA: Diagnosis not present

## 2019-12-29 DIAGNOSIS — J1282 Pneumonia due to coronavirus disease 2019: Secondary | ICD-10-CM | POA: Insufficient documentation

## 2019-12-29 DIAGNOSIS — U071 COVID-19: Secondary | ICD-10-CM | POA: Insufficient documentation

## 2019-12-29 DIAGNOSIS — E8809 Other disorders of plasma-protein metabolism, not elsewhere classified: Secondary | ICD-10-CM | POA: Insufficient documentation

## 2019-12-29 DIAGNOSIS — R0602 Shortness of breath: Secondary | ICD-10-CM | POA: Diagnosis not present

## 2019-12-29 DIAGNOSIS — J449 Chronic obstructive pulmonary disease, unspecified: Secondary | ICD-10-CM | POA: Insufficient documentation

## 2019-12-29 DIAGNOSIS — R531 Weakness: Secondary | ICD-10-CM | POA: Diagnosis not present

## 2019-12-29 NOTE — Telephone Encounter (Signed)
Left message to return call 

## 2019-12-30 ENCOUNTER — Encounter: Payer: Self-pay | Admitting: Family Medicine

## 2019-12-30 ENCOUNTER — Other Ambulatory Visit: Payer: Self-pay | Admitting: Family Medicine

## 2019-12-30 DIAGNOSIS — J9601 Acute respiratory failure with hypoxia: Secondary | ICD-10-CM | POA: Diagnosis not present

## 2019-12-30 DIAGNOSIS — U071 COVID-19: Secondary | ICD-10-CM

## 2019-12-30 DIAGNOSIS — R531 Weakness: Secondary | ICD-10-CM | POA: Diagnosis not present

## 2019-12-30 DIAGNOSIS — J45901 Unspecified asthma with (acute) exacerbation: Secondary | ICD-10-CM | POA: Diagnosis not present

## 2019-12-30 DIAGNOSIS — J1282 Pneumonia due to coronavirus disease 2019: Secondary | ICD-10-CM | POA: Diagnosis not present

## 2019-12-30 DIAGNOSIS — J449 Chronic obstructive pulmonary disease, unspecified: Secondary | ICD-10-CM | POA: Diagnosis not present

## 2019-12-30 LAB — CBC WITH DIFFERENTIAL/PLATELET
Basophils Absolute: 0.1 10*3/uL (ref 0.0–0.2)
Basos: 1 %
EOS (ABSOLUTE): 0.3 10*3/uL (ref 0.0–0.4)
Eos: 4 %
Hematocrit: 44.2 % (ref 37.5–51.0)
Hemoglobin: 15.1 g/dL (ref 13.0–17.7)
Immature Grans (Abs): 0 10*3/uL (ref 0.0–0.1)
Immature Granulocytes: 0 %
Lymphocytes Absolute: 3.9 10*3/uL — ABNORMAL HIGH (ref 0.7–3.1)
Lymphs: 43 %
MCH: 32.3 pg (ref 26.6–33.0)
MCHC: 34.2 g/dL (ref 31.5–35.7)
MCV: 94 fL (ref 79–97)
Monocytes Absolute: 0.8 10*3/uL (ref 0.1–0.9)
Monocytes: 8 %
Neutrophils Absolute: 4.1 10*3/uL (ref 1.4–7.0)
Neutrophils: 44 %
Platelets: 403 10*3/uL (ref 150–450)
RBC: 4.68 x10E6/uL (ref 4.14–5.80)
RDW: 12.7 % (ref 11.6–15.4)
WBC: 9.1 10*3/uL (ref 3.4–10.8)

## 2019-12-30 NOTE — Telephone Encounter (Signed)
Pt returned call and verbalized understanding  

## 2019-12-31 DIAGNOSIS — J1282 Pneumonia due to coronavirus disease 2019: Secondary | ICD-10-CM | POA: Diagnosis not present

## 2019-12-31 DIAGNOSIS — J9601 Acute respiratory failure with hypoxia: Secondary | ICD-10-CM | POA: Diagnosis not present

## 2019-12-31 DIAGNOSIS — J45901 Unspecified asthma with (acute) exacerbation: Secondary | ICD-10-CM | POA: Diagnosis not present

## 2019-12-31 DIAGNOSIS — U071 COVID-19: Secondary | ICD-10-CM | POA: Diagnosis not present

## 2019-12-31 DIAGNOSIS — J449 Chronic obstructive pulmonary disease, unspecified: Secondary | ICD-10-CM | POA: Diagnosis not present

## 2019-12-31 DIAGNOSIS — R531 Weakness: Secondary | ICD-10-CM | POA: Diagnosis not present

## 2020-01-01 DIAGNOSIS — U071 COVID-19: Secondary | ICD-10-CM | POA: Diagnosis not present

## 2020-01-01 DIAGNOSIS — J9601 Acute respiratory failure with hypoxia: Secondary | ICD-10-CM | POA: Diagnosis not present

## 2020-01-01 DIAGNOSIS — R531 Weakness: Secondary | ICD-10-CM | POA: Diagnosis not present

## 2020-01-01 DIAGNOSIS — J449 Chronic obstructive pulmonary disease, unspecified: Secondary | ICD-10-CM | POA: Diagnosis not present

## 2020-01-01 DIAGNOSIS — J1282 Pneumonia due to coronavirus disease 2019: Secondary | ICD-10-CM | POA: Diagnosis not present

## 2020-01-01 DIAGNOSIS — J45901 Unspecified asthma with (acute) exacerbation: Secondary | ICD-10-CM | POA: Diagnosis not present

## 2020-01-04 DIAGNOSIS — Z23 Encounter for immunization: Secondary | ICD-10-CM | POA: Diagnosis not present

## 2020-01-04 DIAGNOSIS — J45901 Unspecified asthma with (acute) exacerbation: Secondary | ICD-10-CM | POA: Diagnosis not present

## 2020-01-04 DIAGNOSIS — J449 Chronic obstructive pulmonary disease, unspecified: Secondary | ICD-10-CM | POA: Diagnosis not present

## 2020-01-04 DIAGNOSIS — R531 Weakness: Secondary | ICD-10-CM | POA: Diagnosis not present

## 2020-01-04 DIAGNOSIS — U071 COVID-19: Secondary | ICD-10-CM | POA: Diagnosis not present

## 2020-01-04 DIAGNOSIS — J1282 Pneumonia due to coronavirus disease 2019: Secondary | ICD-10-CM | POA: Diagnosis not present

## 2020-01-04 DIAGNOSIS — J9601 Acute respiratory failure with hypoxia: Secondary | ICD-10-CM | POA: Diagnosis not present

## 2020-01-05 DIAGNOSIS — U071 COVID-19: Secondary | ICD-10-CM | POA: Diagnosis not present

## 2020-01-05 DIAGNOSIS — J45901 Unspecified asthma with (acute) exacerbation: Secondary | ICD-10-CM | POA: Diagnosis not present

## 2020-01-05 DIAGNOSIS — J1282 Pneumonia due to coronavirus disease 2019: Secondary | ICD-10-CM | POA: Diagnosis not present

## 2020-01-05 DIAGNOSIS — R531 Weakness: Secondary | ICD-10-CM | POA: Diagnosis not present

## 2020-01-05 DIAGNOSIS — J449 Chronic obstructive pulmonary disease, unspecified: Secondary | ICD-10-CM | POA: Diagnosis not present

## 2020-01-05 DIAGNOSIS — J9601 Acute respiratory failure with hypoxia: Secondary | ICD-10-CM | POA: Diagnosis not present

## 2020-01-06 ENCOUNTER — Other Ambulatory Visit: Payer: Self-pay | Admitting: Family Medicine

## 2020-01-06 DIAGNOSIS — J449 Chronic obstructive pulmonary disease, unspecified: Secondary | ICD-10-CM | POA: Diagnosis not present

## 2020-01-06 DIAGNOSIS — J1282 Pneumonia due to coronavirus disease 2019: Secondary | ICD-10-CM | POA: Diagnosis not present

## 2020-01-06 DIAGNOSIS — R531 Weakness: Secondary | ICD-10-CM | POA: Diagnosis not present

## 2020-01-06 DIAGNOSIS — J9601 Acute respiratory failure with hypoxia: Secondary | ICD-10-CM | POA: Diagnosis not present

## 2020-01-06 DIAGNOSIS — J45901 Unspecified asthma with (acute) exacerbation: Secondary | ICD-10-CM | POA: Diagnosis not present

## 2020-01-06 DIAGNOSIS — U071 COVID-19: Secondary | ICD-10-CM | POA: Diagnosis not present

## 2020-01-06 NOTE — Telephone Encounter (Signed)
6 months

## 2020-01-06 NOTE — Telephone Encounter (Signed)
Last med check 7/21

## 2020-01-07 DIAGNOSIS — R531 Weakness: Secondary | ICD-10-CM | POA: Diagnosis not present

## 2020-01-07 DIAGNOSIS — J1282 Pneumonia due to coronavirus disease 2019: Secondary | ICD-10-CM | POA: Diagnosis not present

## 2020-01-07 DIAGNOSIS — U071 COVID-19: Secondary | ICD-10-CM | POA: Diagnosis not present

## 2020-01-07 DIAGNOSIS — J9601 Acute respiratory failure with hypoxia: Secondary | ICD-10-CM | POA: Diagnosis not present

## 2020-01-07 DIAGNOSIS — J45901 Unspecified asthma with (acute) exacerbation: Secondary | ICD-10-CM | POA: Diagnosis not present

## 2020-01-07 DIAGNOSIS — J449 Chronic obstructive pulmonary disease, unspecified: Secondary | ICD-10-CM | POA: Diagnosis not present

## 2020-01-08 DIAGNOSIS — J1282 Pneumonia due to coronavirus disease 2019: Secondary | ICD-10-CM | POA: Diagnosis not present

## 2020-01-08 DIAGNOSIS — J9601 Acute respiratory failure with hypoxia: Secondary | ICD-10-CM | POA: Diagnosis not present

## 2020-01-08 DIAGNOSIS — J45901 Unspecified asthma with (acute) exacerbation: Secondary | ICD-10-CM | POA: Diagnosis not present

## 2020-01-08 DIAGNOSIS — I471 Supraventricular tachycardia: Secondary | ICD-10-CM | POA: Diagnosis not present

## 2020-01-08 DIAGNOSIS — F329 Major depressive disorder, single episode, unspecified: Secondary | ICD-10-CM | POA: Diagnosis not present

## 2020-01-08 DIAGNOSIS — G40909 Epilepsy, unspecified, not intractable, without status epilepticus: Secondary | ICD-10-CM | POA: Diagnosis not present

## 2020-01-08 DIAGNOSIS — F411 Generalized anxiety disorder: Secondary | ICD-10-CM | POA: Diagnosis not present

## 2020-01-08 DIAGNOSIS — Z79891 Long term (current) use of opiate analgesic: Secondary | ICD-10-CM | POA: Diagnosis not present

## 2020-01-08 DIAGNOSIS — Z9981 Dependence on supplemental oxygen: Secondary | ICD-10-CM | POA: Diagnosis not present

## 2020-01-08 DIAGNOSIS — R531 Weakness: Secondary | ICD-10-CM | POA: Diagnosis not present

## 2020-01-08 DIAGNOSIS — M545 Low back pain: Secondary | ICD-10-CM | POA: Diagnosis not present

## 2020-01-08 DIAGNOSIS — J449 Chronic obstructive pulmonary disease, unspecified: Secondary | ICD-10-CM | POA: Diagnosis not present

## 2020-01-08 DIAGNOSIS — Z89511 Acquired absence of right leg below knee: Secondary | ICD-10-CM | POA: Diagnosis not present

## 2020-01-08 DIAGNOSIS — U071 COVID-19: Secondary | ICD-10-CM | POA: Diagnosis not present

## 2020-01-11 DIAGNOSIS — U071 COVID-19: Secondary | ICD-10-CM | POA: Diagnosis not present

## 2020-01-11 DIAGNOSIS — J1282 Pneumonia due to coronavirus disease 2019: Secondary | ICD-10-CM | POA: Diagnosis not present

## 2020-01-11 DIAGNOSIS — J45901 Unspecified asthma with (acute) exacerbation: Secondary | ICD-10-CM | POA: Diagnosis not present

## 2020-01-11 DIAGNOSIS — R531 Weakness: Secondary | ICD-10-CM | POA: Diagnosis not present

## 2020-01-11 DIAGNOSIS — J9601 Acute respiratory failure with hypoxia: Secondary | ICD-10-CM | POA: Diagnosis not present

## 2020-01-11 DIAGNOSIS — J449 Chronic obstructive pulmonary disease, unspecified: Secondary | ICD-10-CM | POA: Diagnosis not present

## 2020-01-12 DIAGNOSIS — J1282 Pneumonia due to coronavirus disease 2019: Secondary | ICD-10-CM | POA: Diagnosis not present

## 2020-01-12 DIAGNOSIS — J45901 Unspecified asthma with (acute) exacerbation: Secondary | ICD-10-CM | POA: Diagnosis not present

## 2020-01-12 DIAGNOSIS — U071 COVID-19: Secondary | ICD-10-CM | POA: Diagnosis not present

## 2020-01-12 DIAGNOSIS — J449 Chronic obstructive pulmonary disease, unspecified: Secondary | ICD-10-CM | POA: Diagnosis not present

## 2020-01-12 DIAGNOSIS — R531 Weakness: Secondary | ICD-10-CM | POA: Diagnosis not present

## 2020-01-12 DIAGNOSIS — J9601 Acute respiratory failure with hypoxia: Secondary | ICD-10-CM | POA: Diagnosis not present

## 2020-01-13 ENCOUNTER — Other Ambulatory Visit: Payer: Self-pay | Admitting: Family Medicine

## 2020-01-13 DIAGNOSIS — R531 Weakness: Secondary | ICD-10-CM | POA: Diagnosis not present

## 2020-01-13 DIAGNOSIS — J449 Chronic obstructive pulmonary disease, unspecified: Secondary | ICD-10-CM | POA: Diagnosis not present

## 2020-01-13 DIAGNOSIS — J45901 Unspecified asthma with (acute) exacerbation: Secondary | ICD-10-CM | POA: Diagnosis not present

## 2020-01-13 DIAGNOSIS — J9601 Acute respiratory failure with hypoxia: Secondary | ICD-10-CM | POA: Diagnosis not present

## 2020-01-13 DIAGNOSIS — U071 COVID-19: Secondary | ICD-10-CM | POA: Diagnosis not present

## 2020-01-13 DIAGNOSIS — J1282 Pneumonia due to coronavirus disease 2019: Secondary | ICD-10-CM | POA: Diagnosis not present

## 2020-01-14 DIAGNOSIS — J449 Chronic obstructive pulmonary disease, unspecified: Secondary | ICD-10-CM | POA: Diagnosis not present

## 2020-01-14 DIAGNOSIS — J9601 Acute respiratory failure with hypoxia: Secondary | ICD-10-CM | POA: Diagnosis not present

## 2020-01-14 DIAGNOSIS — R531 Weakness: Secondary | ICD-10-CM | POA: Diagnosis not present

## 2020-01-14 DIAGNOSIS — J45901 Unspecified asthma with (acute) exacerbation: Secondary | ICD-10-CM | POA: Diagnosis not present

## 2020-01-14 DIAGNOSIS — U071 COVID-19: Secondary | ICD-10-CM | POA: Diagnosis not present

## 2020-01-14 DIAGNOSIS — J1282 Pneumonia due to coronavirus disease 2019: Secondary | ICD-10-CM | POA: Diagnosis not present

## 2020-01-19 ENCOUNTER — Ambulatory Visit (INDEPENDENT_AMBULATORY_CARE_PROVIDER_SITE_OTHER): Payer: Medicare Other | Admitting: Family Medicine

## 2020-01-19 ENCOUNTER — Other Ambulatory Visit: Payer: Self-pay

## 2020-01-19 DIAGNOSIS — J45901 Unspecified asthma with (acute) exacerbation: Secondary | ICD-10-CM | POA: Diagnosis not present

## 2020-01-19 DIAGNOSIS — R531 Weakness: Secondary | ICD-10-CM | POA: Diagnosis not present

## 2020-01-19 DIAGNOSIS — U071 COVID-19: Secondary | ICD-10-CM | POA: Diagnosis not present

## 2020-01-19 DIAGNOSIS — Z89512 Acquired absence of left leg below knee: Secondary | ICD-10-CM | POA: Diagnosis not present

## 2020-01-19 DIAGNOSIS — E7849 Other hyperlipidemia: Secondary | ICD-10-CM | POA: Diagnosis not present

## 2020-01-19 DIAGNOSIS — J449 Chronic obstructive pulmonary disease, unspecified: Secondary | ICD-10-CM | POA: Diagnosis not present

## 2020-01-19 DIAGNOSIS — F411 Generalized anxiety disorder: Secondary | ICD-10-CM

## 2020-01-19 DIAGNOSIS — J9601 Acute respiratory failure with hypoxia: Secondary | ICD-10-CM | POA: Diagnosis not present

## 2020-01-19 DIAGNOSIS — J1282 Pneumonia due to coronavirus disease 2019: Secondary | ICD-10-CM | POA: Diagnosis not present

## 2020-01-19 MED ORDER — HYDROCODONE-ACETAMINOPHEN 5-325 MG PO TABS: ORAL_TABLET | ORAL | 0 refills | Status: DC

## 2020-01-19 NOTE — Progress Notes (Signed)
Subjective:    Patient ID: [name], male    DOB: [birth year], 69 y.o.   MRN: [id number] Virtual visit audiovisual HPI  Patient calls for a follow up on COPD/Pneumonia. Patient states his breathing is much better but his heart rate is still up some. This patient was seen today for chronic pain  The medication list was reviewed and updated.   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  Took it earlier today  The patient was advised the importance of maintaining medication and not using illegal substances with these.  Here for refills and follow up  The patient was educated that we can provide 3 monthly scripts for their medication, it is their responsibility to follow the instructions.  Side effects or complications from medications: Denies any side effects or problems  Patient is aware that pain medications are meant to minimize the severity of the pain to allow their pain levels to improve to allow for better function. They are aware of that pain medications cannot totally remove their pain.  Due for UDT ( at least once per year) : Later this year      Virtual Visit via Video Note  I connected with [name] on 01/19/20 at 10:00 AM EST by a video enabled telemedicine application and verified that I am speaking with the correct person using two identifiers.  Location: Patient: home Provider: office   I discussed the limitations of evaluation and management by telemedicine and the availability of in person appointments. The patient expressed understanding and agreed to proceed.  History of Present Illness:    Observations/Objective:   Assessment and Plan:   Follow Up  Instructions:    I discussed the assessment and treatment plan with the patient. The patient was provided an opportunity to ask questions and all were answered. The patient agreed with the plan and demonstrated an understanding of the instructions.   The patient was advised to call back or seek an in-person evaluation if the symptoms worsen or if the condition fails to improve as anticipated.  I provided 20 minutes of non-face-to-face time during this encounter.      Review of Systems  Constitutional: Negative for activity change, appetite change and fatigue.  HENT: Negative for congestion and rhinorrhea.   Respiratory: Negative for cough and shortness of breath.   Cardiovascular: Negative for chest pain and leg swelling.  Gastrointestinal: Negative for abdominal pain, nausea and vomiting.  Neurological: Negative for dizziness and headaches.  Psychiatric/Behavioral: Negative for agitation and behavioral problems.       Objective:   Physical Exam   Today's visit was virtual physical exam was not possible for this visit      Assessment & Plan:  Slight elevation of the heart rate is not unusual after Covid this could be partly due to autonomic dysfunction it does not sound like he is dealing with atrial fibrillation we will geared toward doing a in person visit in April with lab work annual wellness as well as chronic health issues  Patient was encouraged to increase physical activity and exercise within the confines of the assisted living facility  The patient was seen in followup for chronic  pain. A review over at their current pain status was discussed. Drug registry was checked. Prescriptions were given. Discussion was held regarding the importance of compliance with medication as well as pain medication contract.  Time for questions regarding pain management plan occurred. Importance of regular followup visits was discussed. Patient was informed that medication may cause  drowsiness and should not be combined  with other medications/alcohol or street drugs. Patient was cautioned that medication could cause drowsiness. If the patient feels medication is causing altered alertness then do not drive or operate dangerous equipment.

## 2020-01-20 DIAGNOSIS — J45901 Unspecified asthma with (acute) exacerbation: Secondary | ICD-10-CM | POA: Diagnosis not present

## 2020-01-20 DIAGNOSIS — J9601 Acute respiratory failure with hypoxia: Secondary | ICD-10-CM | POA: Diagnosis not present

## 2020-01-20 DIAGNOSIS — J449 Chronic obstructive pulmonary disease, unspecified: Secondary | ICD-10-CM | POA: Diagnosis not present

## 2020-01-20 DIAGNOSIS — B351 Tinea unguium: Secondary | ICD-10-CM | POA: Diagnosis not present

## 2020-01-20 DIAGNOSIS — M79675 Pain in left toe(s): Secondary | ICD-10-CM | POA: Diagnosis not present

## 2020-01-20 DIAGNOSIS — U071 COVID-19: Secondary | ICD-10-CM | POA: Diagnosis not present

## 2020-01-20 DIAGNOSIS — R531 Weakness: Secondary | ICD-10-CM | POA: Diagnosis not present

## 2020-01-20 DIAGNOSIS — J1282 Pneumonia due to coronavirus disease 2019: Secondary | ICD-10-CM | POA: Diagnosis not present

## 2020-01-23 DIAGNOSIS — J45901 Unspecified asthma with (acute) exacerbation: Secondary | ICD-10-CM | POA: Diagnosis not present

## 2020-01-23 DIAGNOSIS — U071 COVID-19: Secondary | ICD-10-CM | POA: Diagnosis not present

## 2020-01-23 DIAGNOSIS — R531 Weakness: Secondary | ICD-10-CM | POA: Diagnosis not present

## 2020-01-23 DIAGNOSIS — J1282 Pneumonia due to coronavirus disease 2019: Secondary | ICD-10-CM | POA: Diagnosis not present

## 2020-01-23 DIAGNOSIS — J449 Chronic obstructive pulmonary disease, unspecified: Secondary | ICD-10-CM | POA: Diagnosis not present

## 2020-01-23 DIAGNOSIS — J9601 Acute respiratory failure with hypoxia: Secondary | ICD-10-CM | POA: Diagnosis not present

## 2020-02-01 DIAGNOSIS — Z23 Encounter for immunization: Secondary | ICD-10-CM | POA: Diagnosis not present

## 2020-02-04 ENCOUNTER — Other Ambulatory Visit: Payer: Self-pay

## 2020-02-04 ENCOUNTER — Ambulatory Visit (INDEPENDENT_AMBULATORY_CARE_PROVIDER_SITE_OTHER): Payer: Medicare Other | Admitting: Family Medicine

## 2020-02-04 DIAGNOSIS — R109 Unspecified abdominal pain: Secondary | ICD-10-CM

## 2020-02-04 MED ORDER — HYOSCYAMINE SULFATE SL 0.125 MG SL SUBL: SUBLINGUAL_TABLET | SUBLINGUAL | 2 refills | Status: DC

## 2020-02-04 NOTE — Addendum Note (Signed)
Addended by: Vicente Males on: 02/04/2020 01:05 PM   Modules accepted: Orders

## 2020-02-04 NOTE — Progress Notes (Signed)
   Subjective:  Audio only  Patient ID: [name], male    DOB: Mar 08, 1951, 69 y.o.   MRN: [id number]  Abdominal Pain This is a new problem. Episode onset: one month. The pain is located in the generalized abdominal region. The abdominal pain radiates to the chest and back.  Sunday Spillers at [employer] pt informed her of abdominal cramping this morning. Pt states it happens about 3-4 times a week. Has been going on about a month.  Virtual Visit via Telephone Note  I connected with [name] on 02/04/20 at 11:00 AM EST by telephone and verified that I am speaking with the correct person using two identifiers.  Location: Patient: home Provider: office   I discussed the limitations, risks, security and privacy concerns of performing an evaluation and management service by telephone and the availability of in person appointments. I also discussed with the patient that there may be a patient responsible charge related to this service. The patient expressed understanding and agreed to proceed.   History of Present Illness:    Observations/Objective:   Assessment and Plan:   Follow Up Instructions:    I discussed the assessment and treatment plan with the patient. The patient was provided an opportunity to ask questions and all were answered. The patient agreed with the plan and demonstrated an understanding of the instructions.   The patient was advised to call back or seek an in-person evaluation if the symptoms worsen or if the condition fails to improve as anticipated.  I provided 22 minutes of non-face-to-face time during this encounter.   Patient describes intermittent abdominal cramps.  [place] from the low to mid abdomen.  Radiate around to his back.  Moderately substantial in nature.  Slight nausea at most.  Comes and goes over the past month.  Will have days where he is completely fine.  No fever no chills no vomiting no change in appetite.  No difficulty with  constipation despite multiple narcotic tablets per day     Review of Systems  Gastrointestinal: Positive for abdominal pain.       Objective:   Physical Exam  Virtual      Assessment & Plan:  Impression intermittent abdominal cramps.  Etiology unclear.  Will give trial of Levsin SL.  Warning signs discussed with both patient and nurse.  If persists will need to get back with [place] for further work-up

## 2020-03-01 DIAGNOSIS — Z20828 Contact with and (suspected) exposure to other viral communicable diseases: Secondary | ICD-10-CM | POA: Diagnosis not present

## 2020-03-01 DIAGNOSIS — U071 COVID-19: Secondary | ICD-10-CM | POA: Diagnosis not present

## 2020-03-14 ENCOUNTER — Other Ambulatory Visit: Payer: Self-pay | Admitting: Family Medicine

## 2020-03-14 DIAGNOSIS — Z20828 Contact with and (suspected) exposure to other viral communicable diseases: Secondary | ICD-10-CM | POA: Diagnosis not present

## 2020-03-14 DIAGNOSIS — U071 COVID-19: Secondary | ICD-10-CM | POA: Diagnosis not present

## 2020-03-14 NOTE — Telephone Encounter (Signed)
6 months on refill please

## 2020-03-17 ENCOUNTER — Telehealth: Payer: Self-pay | Admitting: Family Medicine

## 2020-03-17 NOTE — Telephone Encounter (Signed)
PA attempted for patient Alprazolam 0.5 mg tablets. PA is approved until 12/02/2020. Left message to return call

## 2020-03-21 DIAGNOSIS — U071 COVID-19: Secondary | ICD-10-CM | POA: Diagnosis not present

## 2020-03-21 DIAGNOSIS — Z20828 Contact with and (suspected) exposure to other viral communicable diseases: Secondary | ICD-10-CM | POA: Diagnosis not present

## 2020-03-22 DIAGNOSIS — Z20828 Contact with and (suspected) exposure to other viral communicable diseases: Secondary | ICD-10-CM | POA: Diagnosis not present

## 2020-03-22 DIAGNOSIS — U071 COVID-19: Secondary | ICD-10-CM | POA: Diagnosis not present

## 2020-03-23 ENCOUNTER — Telehealth: Payer: Self-pay | Admitting: Family Medicine

## 2020-03-23 NOTE — Telephone Encounter (Signed)
Form was signed thank you

## 2020-03-23 NOTE — Telephone Encounter (Signed)
Please advise. Thank you

## 2020-03-23 NOTE — Telephone Encounter (Signed)
[facility] dropped off orders to be signed. Placed in providers office.

## 2020-04-06 ENCOUNTER — Other Ambulatory Visit: Payer: Self-pay | Admitting: Family Medicine

## 2020-04-15 ENCOUNTER — Other Ambulatory Visit: Payer: Self-pay | Admitting: Family Medicine

## 2020-04-19 ENCOUNTER — Telehealth: Payer: Self-pay | Admitting: Family Medicine

## 2020-04-19 ENCOUNTER — Other Ambulatory Visit: Payer: Self-pay | Admitting: Family Medicine

## 2020-04-19 NOTE — Telephone Encounter (Signed)
[facility] pharmacist called requesting refill on patient pain medication hydrocodone 5/325 will be out tomorrow.[facility] Please advise

## 2020-04-19 NOTE — Telephone Encounter (Signed)
Pt last seen 01/19/20 for pain management. Please advise. Thank you

## 2020-04-20 ENCOUNTER — Telehealth: Payer: Self-pay | Admitting: Family Medicine

## 2020-04-20 NOTE — Telephone Encounter (Signed)
Pt has f to f scheduled 6/8

## 2020-04-20 NOTE — Telephone Encounter (Signed)
Forms are in D.[place] in box.

## 2020-04-20 NOTE — Telephone Encounter (Signed)
[facility] needing order for his prosthetic leg to be put on in the am be removed at [facility] by self administering and needing order for 0/2 at 2 liters PRN this was faxed over 5/12 and they called yesterday about this needing it as soon as possible. I explained out of office until 5/24 in your folder. Please advised if you need a another provider to address

## 2020-04-20 NOTE — Telephone Encounter (Signed)
Did the form get removed?  Thx,   Dr. Darene Lamer

## 2020-04-20 NOTE — Telephone Encounter (Signed)
Form in dr taylor's folder

## 2020-04-20 NOTE — Telephone Encounter (Signed)
Appt made for in office and notified med sent to pharm.

## 2020-04-20 NOTE — Telephone Encounter (Signed)
Please be aware I did send in a refill today.  Patient does need to do a follow-up visit within the next 3 weeks please I would prefer in person if his rest home will allow that otherwise it can be done virtual

## 2020-05-10 ENCOUNTER — Encounter: Payer: Self-pay | Admitting: Family Medicine

## 2020-05-10 ENCOUNTER — Ambulatory Visit (INDEPENDENT_AMBULATORY_CARE_PROVIDER_SITE_OTHER): Payer: Medicare Other | Admitting: Family Medicine

## 2020-05-10 ENCOUNTER — Other Ambulatory Visit: Payer: Self-pay

## 2020-05-10 VITALS — BP 128/86 | Temp 97.8°F | Wt 178.4 lb

## 2020-05-10 DIAGNOSIS — Z1211 Encounter for screening for malignant neoplasm of colon: Secondary | ICD-10-CM

## 2020-05-10 DIAGNOSIS — Z79891 Long term (current) use of opiate analgesic: Secondary | ICD-10-CM | POA: Diagnosis not present

## 2020-05-10 DIAGNOSIS — F411 Generalized anxiety disorder: Secondary | ICD-10-CM

## 2020-05-10 DIAGNOSIS — Z89512 Acquired absence of left leg below knee: Secondary | ICD-10-CM | POA: Diagnosis not present

## 2020-05-10 DIAGNOSIS — Z23 Encounter for immunization: Secondary | ICD-10-CM | POA: Diagnosis not present

## 2020-05-10 DIAGNOSIS — M5440 Lumbago with sciatica, unspecified side: Secondary | ICD-10-CM

## 2020-05-10 DIAGNOSIS — R1084 Generalized abdominal pain: Secondary | ICD-10-CM

## 2020-05-10 DIAGNOSIS — Z125 Encounter for screening for malignant neoplasm of prostate: Secondary | ICD-10-CM

## 2020-05-10 DIAGNOSIS — G8929 Other chronic pain: Secondary | ICD-10-CM | POA: Insufficient documentation

## 2020-05-10 LAB — MED LIST ATTACHED SEPARATELY

## 2020-05-10 MED ORDER — TEMAZEPAM 15 MG PO CAPS: 15.0000 mg | ORAL_CAPSULE | Freq: Every day | ORAL | 5 refills | Status: DC

## 2020-05-10 MED ORDER — ALPRAZOLAM 0.5 MG PO TABS: 0.5000 mg | ORAL_TABLET | Freq: Three times a day (TID) | ORAL | 4 refills | Status: DC

## 2020-05-10 MED ORDER — HYDROCODONE-ACETAMINOPHEN 5-325 MG PO TABS: ORAL_TABLET | ORAL | 0 refills | Status: DC

## 2020-05-10 MED ORDER — ZOSTER VAC RECOMB ADJUVANTED 50 MCG/0.5ML IM SUSR: 0.5000 mL | Freq: Once | INTRAMUSCULAR | 1 refills | Status: AC

## 2020-05-10 MED ORDER — ALPRAZOLAM 0.5 MG PO TABS: ORAL_TABLET | ORAL | 4 refills | Status: DC

## 2020-05-10 NOTE — Progress Notes (Signed)
Subjective:    Patient ID: [name], male    DOB: Jan 01, 1951, 69 y.o.   MRN: [id number]  HPI  This patient was seen today for chronic pain  The medication list was reviewed and updated.   -Compliance with medication: yes  - Number patient states they take daily: 3 per day  -when was the last dose patient took? 9:00 this morning  The patient was advised the importance of maintaining medication and not using illegal substances with these.  Here for refills and follow up  The patient was educated that we can provide 3 monthly scripts for their medication, it is their responsibility to follow the instructions.  Side effects or complications from medications: none  Patient is aware that pain medications are meant to minimize the severity of the pain to allow their pain levels to improve to allow for better function. They are aware of that pain medications cannot totally remove their pain.  Due for UDT ( at least once per year) : done today  Fall Risk  10/28/2019 06/14/2014  Falls in the past year? 0 No  Comment Emmi Telephone Survey: data to providers prior to load -      Review of Systems  Constitutional: Negative for activity change.  HENT: Negative for congestion and rhinorrhea.   Respiratory: Negative for cough and shortness of breath.   Cardiovascular: Negative for chest pain.  Gastrointestinal: Negative for abdominal pain, diarrhea, nausea and vomiting.  Genitourinary: Negative for dysuria and hematuria.  Neurological: Negative for weakness and headaches.  Psychiatric/Behavioral: Negative for behavioral problems and confusion.       Objective:   Physical Exam Vitals reviewed.  Constitutional:      General: He is not in acute distress. HENT:     Head: Normocephalic and atraumatic.  Eyes:     General:        Right eye: No discharge.        Left eye: No discharge.  Neck:     Trachea: No tracheal deviation.  Cardiovascular:     Rate and Rhythm: Normal  rate and regular rhythm.     Heart sounds: Normal heart sounds. No murmur.  Pulmonary:     Effort: Pulmonary effort is normal. No respiratory distress.     Breath sounds: Normal breath sounds.  Lymphadenopathy:     Cervical: No cervical adenopathy.  Skin:    General: Skin is warm and dry.  Neurological:     Mental Status: He is alert.     Coordination: Coordination normal.  Psychiatric:        Behavior: Behavior normal.           Assessment & Plan:  1. Encounter for long-term opiate analgesic use The patient was seen in followup for chronic pain. A review over at their current pain status was discussed. Drug registry was checked. Prescriptions were given.  Regular follow-up recommended. Discussion was held regarding the importance of compliance with medication as well as pain medication contract.  Patient was informed that medication may cause drowsiness and should not be combined  with other medications/alcohol or street drugs. If the patient feels medication is causing altered alertness then do not drive or operate dangerous equipment.  Medication does allow him to function better urine drug screen ordered today - ToxASSURE Select 13 (MW), Urine  2. Generalized anxiety disorder Significant anxiety disorder this been going on for years was under care of psychiatry we will decrease his Xanax 10-2 times per day  but uses Xanax is necessary in this patient  3. Chronic bilateral low back pain with sciatica, sciatica laterality unspecified Pain medication does help keep this under control stretching exercises recommended  4. Encounter for chronic pain management Please see above  5. History of left below knee amputation (HCC) No ulcers on the stump at this point time prosthetic doing well  6. Generalized abdominal pain Intermittent generalized abdominal discomfort lab work ordered.  Await results - Hepatic function panel - CBC with Differential/Platelet - Lipase - Basic  metabolic panel  7. Screen for colon cancer Referral for colonoscopy - Ambulatory referral to Gastroenterology  8. Need for vaccination Pneumonia vaccine today - Pneumococcal polysaccharide vaccine 23-valent greater than or equal to 2yo subcutaneous/IM  9. Screening for prostate cancer PSA ordered  Follow-up 3 months - PSA

## 2020-05-11 ENCOUNTER — Encounter: Payer: Self-pay | Admitting: Family Medicine

## 2020-05-11 LAB — CBC WITH DIFFERENTIAL/PLATELET
Basophils Absolute: 0 10*3/uL (ref 0.0–0.2)
Basos: 0 %
EOS (ABSOLUTE): 0.4 10*3/uL (ref 0.0–0.4)
Eos: 4 %
Hematocrit: 47.2 % (ref 37.5–51.0)
Hemoglobin: 16.2 g/dL (ref 13.0–17.7)
Immature Grans (Abs): 0 10*3/uL (ref 0.0–0.1)
Immature Granulocytes: 0 %
Lymphocytes Absolute: 3.2 10*3/uL — ABNORMAL HIGH (ref 0.7–3.1)
Lymphs: 32 %
MCH: 31.6 pg (ref 26.6–33.0)
MCHC: 34.3 g/dL (ref 31.5–35.7)
MCV: 92 fL (ref 79–97)
Monocytes Absolute: 0.8 10*3/uL (ref 0.1–0.9)
Monocytes: 8 %
Neutrophils Absolute: 5.6 10*3/uL (ref 1.4–7.0)
Neutrophils: 56 %
Platelets: 256 10*3/uL (ref 150–450)
RBC: 5.12 x10E6/uL (ref 4.14–5.80)
RDW: 13.2 % (ref 11.6–15.4)
WBC: 10.1 10*3/uL (ref 3.4–10.8)

## 2020-05-11 LAB — BASIC METABOLIC PANEL
BUN/Creatinine Ratio: 15 (ref 10–24)
BUN: 12 mg/dL (ref 8–27)
CO2: 27 mmol/L (ref 20–29)
Calcium: 9.7 mg/dL (ref 8.6–10.2)
Chloride: 96 mmol/L (ref 96–106)
Creatinine, Ser: 0.82 mg/dL (ref 0.76–1.27)
GFR calc Af Amer: 107 mL/min/{1.73_m2} (ref >59)
GFR calc non Af Amer: 92 mL/min/{1.73_m2} (ref >59)
Glucose: 93 mg/dL (ref 65–99)
Potassium: 5.2 mmol/L (ref 3.5–5.2)
Sodium: 138 mmol/L (ref 134–144)

## 2020-05-11 LAB — HEPATIC FUNCTION PANEL
ALT: 60 IU/L — ABNORMAL HIGH (ref 0–44)
AST: 23 IU/L (ref 0–40)
Albumin: 4.6 g/dL (ref 3.8–4.8)
Alkaline Phosphatase: 138 IU/L — ABNORMAL HIGH (ref 48–121)
Bilirubin Total: 0.3 mg/dL (ref 0.0–1.2)
Bilirubin, Direct: 0.12 mg/dL (ref 0.00–0.40)
Total Protein: 7.7 g/dL (ref 6.0–8.5)

## 2020-05-11 LAB — PSA: Prostate Specific Ag, Serum: 0.8 ng/mL (ref 0.0–4.0)

## 2020-05-11 LAB — LIPASE: Lipase: 16 U/L (ref 13–78)

## 2020-05-12 ENCOUNTER — Other Ambulatory Visit: Payer: Self-pay | Admitting: Family Medicine

## 2020-05-12 DIAGNOSIS — Z79899 Other long term (current) drug therapy: Secondary | ICD-10-CM

## 2020-05-14 LAB — TOXASSURE SELECT 13 (MW), URINE

## 2020-05-14 LAB — SPECIMEN STATUS REPORT

## 2020-05-16 ENCOUNTER — Other Ambulatory Visit: Payer: Self-pay | Admitting: *Deleted

## 2020-05-20 ENCOUNTER — Encounter: Payer: Self-pay | Admitting: Family Medicine

## 2020-05-24 ENCOUNTER — Encounter: Payer: Self-pay | Admitting: Internal Medicine

## 2020-06-02 ENCOUNTER — Telehealth: Payer: Self-pay | Admitting: Family Medicine

## 2020-06-02 NOTE — Telephone Encounter (Signed)
[facility] dropped off form to be signed.   CB#[phone]

## 2020-06-02 NOTE — Telephone Encounter (Signed)
Form in provider office. Please advise. Thank you. 

## 2020-06-03 NOTE — Telephone Encounter (Signed)
Forms picked up by [facility]

## 2020-06-15 ENCOUNTER — Other Ambulatory Visit: Payer: Self-pay | Admitting: Family Medicine

## 2020-06-17 DIAGNOSIS — Z79899 Other long term (current) drug therapy: Secondary | ICD-10-CM | POA: Diagnosis not present

## 2020-06-18 LAB — HEPATIC FUNCTION PANEL
ALT: 18 IU/L (ref 0–44)
AST: 17 IU/L (ref 0–40)
Albumin: 4.3 g/dL (ref 3.8–4.8)
Alkaline Phosphatase: 99 IU/L (ref 48–121)
Bilirubin Total: 0.5 mg/dL (ref 0.0–1.2)
Bilirubin, Direct: 0.15 mg/dL (ref 0.00–0.40)
Total Protein: 7.2 g/dL (ref 6.0–8.5)

## 2020-06-19 ENCOUNTER — Encounter: Payer: Self-pay | Admitting: Family Medicine

## 2020-07-19 DIAGNOSIS — U071 COVID-19: Secondary | ICD-10-CM | POA: Diagnosis not present

## 2020-07-19 DIAGNOSIS — Z20828 Contact with and (suspected) exposure to other viral communicable diseases: Secondary | ICD-10-CM | POA: Diagnosis not present

## 2020-07-21 ENCOUNTER — Ambulatory Visit: Payer: Medicare Other | Admitting: Nurse Practitioner

## 2020-07-26 DIAGNOSIS — U071 COVID-19: Secondary | ICD-10-CM | POA: Diagnosis not present

## 2020-07-26 DIAGNOSIS — Z20828 Contact with and (suspected) exposure to other viral communicable diseases: Secondary | ICD-10-CM | POA: Diagnosis not present

## 2020-07-27 DIAGNOSIS — U071 COVID-19: Secondary | ICD-10-CM | POA: Diagnosis not present

## 2020-07-27 DIAGNOSIS — Z20828 Contact with and (suspected) exposure to other viral communicable diseases: Secondary | ICD-10-CM | POA: Diagnosis not present

## 2020-07-28 DIAGNOSIS — Z20828 Contact with and (suspected) exposure to other viral communicable diseases: Secondary | ICD-10-CM | POA: Diagnosis not present

## 2020-07-28 DIAGNOSIS — U071 COVID-19: Secondary | ICD-10-CM | POA: Diagnosis not present

## 2020-07-29 DIAGNOSIS — M79675 Pain in left toe(s): Secondary | ICD-10-CM | POA: Diagnosis not present

## 2020-07-29 DIAGNOSIS — B351 Tinea unguium: Secondary | ICD-10-CM | POA: Diagnosis not present

## 2020-08-02 DIAGNOSIS — Z20828 Contact with and (suspected) exposure to other viral communicable diseases: Secondary | ICD-10-CM | POA: Diagnosis not present

## 2020-08-02 DIAGNOSIS — U071 COVID-19: Secondary | ICD-10-CM | POA: Diagnosis not present

## 2020-08-04 DIAGNOSIS — U071 COVID-19: Secondary | ICD-10-CM | POA: Diagnosis not present

## 2020-08-04 DIAGNOSIS — Z20828 Contact with and (suspected) exposure to other viral communicable diseases: Secondary | ICD-10-CM | POA: Diagnosis not present

## 2020-08-09 DIAGNOSIS — Z20828 Contact with and (suspected) exposure to other viral communicable diseases: Secondary | ICD-10-CM | POA: Diagnosis not present

## 2020-08-09 DIAGNOSIS — U071 COVID-19: Secondary | ICD-10-CM | POA: Diagnosis not present

## 2020-08-10 ENCOUNTER — Ambulatory Visit (INDEPENDENT_AMBULATORY_CARE_PROVIDER_SITE_OTHER): Payer: Medicare Other | Admitting: Family Medicine

## 2020-08-10 ENCOUNTER — Other Ambulatory Visit: Payer: Self-pay

## 2020-08-10 ENCOUNTER — Encounter: Payer: Self-pay | Admitting: Family Medicine

## 2020-08-10 VITALS — BP 126/82 | Temp 98.0°F | Ht 72.0 in | Wt 180.2 lb

## 2020-08-10 DIAGNOSIS — E7849 Other hyperlipidemia: Secondary | ICD-10-CM

## 2020-08-10 DIAGNOSIS — G8929 Other chronic pain: Secondary | ICD-10-CM

## 2020-08-10 DIAGNOSIS — J449 Chronic obstructive pulmonary disease, unspecified: Secondary | ICD-10-CM | POA: Diagnosis not present

## 2020-08-10 DIAGNOSIS — U071 COVID-19: Secondary | ICD-10-CM | POA: Diagnosis not present

## 2020-08-10 DIAGNOSIS — F411 Generalized anxiety disorder: Secondary | ICD-10-CM | POA: Diagnosis not present

## 2020-08-10 DIAGNOSIS — G4700 Insomnia, unspecified: Secondary | ICD-10-CM | POA: Diagnosis not present

## 2020-08-10 DIAGNOSIS — M5440 Lumbago with sciatica, unspecified side: Secondary | ICD-10-CM

## 2020-08-10 DIAGNOSIS — Z20828 Contact with and (suspected) exposure to other viral communicable diseases: Secondary | ICD-10-CM | POA: Diagnosis not present

## 2020-08-10 NOTE — Progress Notes (Signed)
   Subjective:    Patient ID: [name], male    DOB: [birth year], 69 y.o.   MRN: [id number]  HPI This patient was seen today for chronic pain  The medication list was reviewed and updated.   -Compliance with medication: yes  - Number patient states they take daily: 3  -when was the last dose patient took? This am  The patient was advised the importance of maintaining medication and not using illegal substances with these.  Here for refills and follow up  The patient was educated that we can provide 3 monthly scripts for their medication, it is their responsibility to follow the instructions.  Side effects or complications from medications: none  Patient is aware that pain medications are meant to minimize the severity of the pain to allow their pain levels to improve to allow for better function. They are aware of that pain medications cannot totally remove their pain.  Due for UDT ( at least once per year) : completed 05/2020       Review of Systems     Objective:   Physical Exam   Results for orders placed or performed in visit on 05/12/20  Hepatic function panel  Result Value Ref Range   Total Protein 7.2 6.0 - 8.5 g/dL   Albumin 4.3 3.8 - 4.8 g/dL   Bilirubin Total 0.5 0.0 - 1.2 mg/dL   Bilirubin, Direct 0.15 0.00 - 0.40 mg/dL   Alkaline Phosphatase 99 48 - 121 IU/L   AST 17 0 - 40 IU/L   ALT 18 0 - 44 IU/L        Assessment & Plan:  1. Other hyperlipidemia Patient to watch diet stay physically active  2. COPD mixed type [facility]) Patient is stay away from smoking may continue inhalers  3. Chronic bilateral low back pain with sciatica, sciatica laterality unspecified Patient with chronic back pain The patient was seen in followup for chronic pain. A review over at their current pain status was discussed. Drug registry was checked. Prescriptions were given.  Regular follow-up recommended. Discussion was held regarding the importance of compliance with  medication as well as pain medication contract.  Patient was informed that medication may cause drowsiness and should not be combined  with other medications/alcohol or street drugs. If the patient feels medication is causing altered alertness then do not drive or operate dangerous equipment.  May continue pain medication as is very important for patient to do a virtual visit in 3 months and a in person in 6 months  4. Insomnia, unspecified type Temazepam is fine but it is not permissible for him to take Xanax at nighttime he understands this but he is rather put off by it  5. Generalized anxiety disorder May continue Xanax during the day.  Tylenol only when necessary for pain may be added to what he is taking  Virtual visit 3 months

## 2020-08-11 DIAGNOSIS — U071 COVID-19: Secondary | ICD-10-CM | POA: Diagnosis not present

## 2020-08-11 DIAGNOSIS — Z20828 Contact with and (suspected) exposure to other viral communicable diseases: Secondary | ICD-10-CM | POA: Diagnosis not present

## 2020-08-15 DIAGNOSIS — Z20828 Contact with and (suspected) exposure to other viral communicable diseases: Secondary | ICD-10-CM | POA: Diagnosis not present

## 2020-08-15 DIAGNOSIS — U071 COVID-19: Secondary | ICD-10-CM | POA: Diagnosis not present

## 2020-08-17 ENCOUNTER — Other Ambulatory Visit: Payer: Self-pay | Admitting: Family Medicine

## 2020-08-18 DIAGNOSIS — U071 COVID-19: Secondary | ICD-10-CM | POA: Diagnosis not present

## 2020-08-18 DIAGNOSIS — Z20828 Contact with and (suspected) exposure to other viral communicable diseases: Secondary | ICD-10-CM | POA: Diagnosis not present

## 2020-08-23 DIAGNOSIS — U071 COVID-19: Secondary | ICD-10-CM | POA: Diagnosis not present

## 2020-08-23 DIAGNOSIS — Z20828 Contact with and (suspected) exposure to other viral communicable diseases: Secondary | ICD-10-CM | POA: Diagnosis not present

## 2020-08-25 DIAGNOSIS — Z20828 Contact with and (suspected) exposure to other viral communicable diseases: Secondary | ICD-10-CM | POA: Diagnosis not present

## 2020-08-25 DIAGNOSIS — U071 COVID-19: Secondary | ICD-10-CM | POA: Diagnosis not present

## 2020-08-30 DIAGNOSIS — U071 COVID-19: Secondary | ICD-10-CM | POA: Diagnosis not present

## 2020-08-30 DIAGNOSIS — Z20828 Contact with and (suspected) exposure to other viral communicable diseases: Secondary | ICD-10-CM | POA: Diagnosis not present

## 2020-09-01 DIAGNOSIS — U071 COVID-19: Secondary | ICD-10-CM | POA: Diagnosis not present

## 2020-09-01 DIAGNOSIS — Z20828 Contact with and (suspected) exposure to other viral communicable diseases: Secondary | ICD-10-CM | POA: Diagnosis not present

## 2020-09-06 ENCOUNTER — Other Ambulatory Visit: Payer: Self-pay | Admitting: Family Medicine

## 2020-09-06 DIAGNOSIS — U071 COVID-19: Secondary | ICD-10-CM | POA: Diagnosis not present

## 2020-09-06 DIAGNOSIS — Z20828 Contact with and (suspected) exposure to other viral communicable diseases: Secondary | ICD-10-CM | POA: Diagnosis not present

## 2020-09-07 MED ORDER — BUDESONIDE-FORMOTEROL FUMARATE 80-4.5 MCG/ACT IN AERO: INHALATION_SPRAY | RESPIRATORY_TRACT | 5 refills | Status: DC

## 2020-09-07 NOTE — Addendum Note (Signed)
Addended by: Dairl Ponder on: 09/07/2020 04:48 PM   Modules accepted: Orders

## 2020-09-08 DIAGNOSIS — U071 COVID-19: Secondary | ICD-10-CM | POA: Diagnosis not present

## 2020-09-08 DIAGNOSIS — Z20828 Contact with and (suspected) exposure to other viral communicable diseases: Secondary | ICD-10-CM | POA: Diagnosis not present

## 2020-09-12 ENCOUNTER — Other Ambulatory Visit: Payer: Self-pay | Admitting: Family Medicine

## 2020-09-13 DIAGNOSIS — U071 COVID-19: Secondary | ICD-10-CM | POA: Diagnosis not present

## 2020-09-13 DIAGNOSIS — Z20828 Contact with and (suspected) exposure to other viral communicable diseases: Secondary | ICD-10-CM | POA: Diagnosis not present

## 2020-09-14 ENCOUNTER — Other Ambulatory Visit: Payer: Self-pay | Admitting: Family Medicine

## 2020-09-15 ENCOUNTER — Other Ambulatory Visit: Payer: Medicare Other

## 2020-09-15 DIAGNOSIS — U071 COVID-19: Secondary | ICD-10-CM | POA: Diagnosis not present

## 2020-09-15 DIAGNOSIS — Z20828 Contact with and (suspected) exposure to other viral communicable diseases: Secondary | ICD-10-CM | POA: Diagnosis not present

## 2020-09-20 DIAGNOSIS — U071 COVID-19: Secondary | ICD-10-CM | POA: Diagnosis not present

## 2020-09-20 DIAGNOSIS — Z20828 Contact with and (suspected) exposure to other viral communicable diseases: Secondary | ICD-10-CM | POA: Diagnosis not present

## 2020-09-22 DIAGNOSIS — Z23 Encounter for immunization: Secondary | ICD-10-CM | POA: Diagnosis not present

## 2020-09-22 DIAGNOSIS — Z20828 Contact with and (suspected) exposure to other viral communicable diseases: Secondary | ICD-10-CM | POA: Diagnosis not present

## 2020-09-22 DIAGNOSIS — U071 COVID-19: Secondary | ICD-10-CM | POA: Diagnosis not present

## 2020-09-26 ENCOUNTER — Telehealth: Payer: Self-pay | Admitting: Family Medicine

## 2020-09-26 NOTE — Telephone Encounter (Signed)
Signatures have been placed on the prescription thank you

## 2020-09-26 NOTE — Telephone Encounter (Signed)
[facility] requesting orders for O2 at 2L prn and order to put on and remove prosthesis. Orders written out, awaiting signature. Will fax after signing. Please advise. Thank you.

## 2020-09-27 DIAGNOSIS — U071 COVID-19: Secondary | ICD-10-CM | POA: Diagnosis not present

## 2020-09-27 DIAGNOSIS — Z20828 Contact with and (suspected) exposure to other viral communicable diseases: Secondary | ICD-10-CM | POA: Diagnosis not present

## 2020-09-27 NOTE — Telephone Encounter (Signed)
Orders faxed

## 2020-09-29 DIAGNOSIS — Z20828 Contact with and (suspected) exposure to other viral communicable diseases: Secondary | ICD-10-CM | POA: Diagnosis not present

## 2020-09-29 DIAGNOSIS — U071 COVID-19: Secondary | ICD-10-CM | POA: Diagnosis not present

## 2020-09-30 DIAGNOSIS — M79675 Pain in left toe(s): Secondary | ICD-10-CM | POA: Diagnosis not present

## 2020-09-30 DIAGNOSIS — B351 Tinea unguium: Secondary | ICD-10-CM | POA: Diagnosis not present

## 2020-10-04 DIAGNOSIS — U071 COVID-19: Secondary | ICD-10-CM | POA: Diagnosis not present

## 2020-10-04 DIAGNOSIS — Z20828 Contact with and (suspected) exposure to other viral communicable diseases: Secondary | ICD-10-CM | POA: Diagnosis not present

## 2020-10-05 ENCOUNTER — Telehealth: Payer: Self-pay | Admitting: Family Medicine

## 2020-10-05 NOTE — Telephone Encounter (Signed)
[facility] contacted. O2 is at 93% at this time.Pt is eating/drinking, no shortness of breath. Pt was in hospital back in Dec 2020 for COVID and [facility] states they believe he had the infusion there. I did look in chart but I do not see anything regarding infusion. Please advise. Thank you.

## 2020-10-05 NOTE — Telephone Encounter (Signed)
Sunday Spillers from [facility] contacted office and states that patient tested positive for COVID on 09/30/20. Pt was feeling fine when tested positive but is now having cough and not feeling 100%. No fever, no shortness of breath. [facility] director wanting to know if anything can be called in to [facility]. Please advise. Thank you

## 2020-10-05 NOTE — Telephone Encounter (Signed)
Question-do they have a O2 sat meter at the facility?  If so what is his O2 sat? Does he feel short of breath?  Able to eat and drink? Typically what is done for this is recommendation for monoclonal antibody infusion-this is through infusion center in [place] Please discuss this with the facility Nurses-go ahead and call the monoclonal antibody hotline to give them patient information-when you give that information please indicate that he is living at assisted living facility and it would be most appropriate to get the phone number at the assisted living facility Phone number for infusion center [phone]

## 2020-10-06 NOTE — Telephone Encounter (Signed)
So that was infusion back then We recommend an infusion now Please go ahead and submit the information to the infusion center I am willing to work the patient into the schedule near 3:30 tomorrow as a back door visit Given his age and underlying health issues I believe that is a good idea If for some odd reason they cannot transport him here let me know if necessary I will go there tomorrow-that is Friday thank you

## 2020-10-07 ENCOUNTER — Ambulatory Visit: Payer: Medicare Other | Admitting: Family Medicine

## 2020-10-07 ENCOUNTER — Other Ambulatory Visit: Payer: Self-pay

## 2020-10-07 DIAGNOSIS — R062 Wheezing: Secondary | ICD-10-CM | POA: Diagnosis not present

## 2020-10-07 DIAGNOSIS — U071 COVID-19: Secondary | ICD-10-CM

## 2020-10-07 NOTE — Progress Notes (Signed)
[phone] 

## 2020-10-07 NOTE — Telephone Encounter (Signed)
Sunday Spillers returned call and states that she spoke with patient. Pt is not wanting to do the infusion. States he is just feeling tired and weak. [facility] is unable to bring pt to office at 3:30. Sunday Spillers states that if provider can come to facility, they can bring pt to front foyer. Please advise. Thank you

## 2020-10-07 NOTE — Telephone Encounter (Signed)
Contacted Sylvia at [facility]. Sunday Spillers request to take to the patient first regarding the infusion. Informed Sunday Spillers that provider would work pt in at 3:30. Sunday Spillers unsure if they can get him here but she will check and give [place] a call back. Informed Sunday Spillers that provider is willing to go to nursing center if unable to come to office

## 2020-10-08 NOTE — Progress Notes (Signed)
   Subjective:    Patient ID: [name], male    DOB: 09-May-1951, 69 y.o.   MRN: [id number] This was essentially a home visit in person HPI This gentleman is at [facility] rest home.  He has Covid.  This patient has had Covid before.  Apparently he was diagnosed 10 days previous.  He is having some cough and wheeze but no high fevers no vomiting no diarrhea.  He states he feels weak.  He has been vaccinated.  Denies shortness of breath.  No vomiting or diarrhea has underlying COPD.   Review of Systems Please see above    Objective:   Physical Exam Temperature is normal O2 saturation 93% Bilateral expiratory wheezes not respiratory distress heart regular Extremities no edema skin warm dry       Assessment & Plan:  Reactive airway Covid infection Albuterol 2 puffs every 4 hours during the day and every 4 hours in the evening as needed No antibiotics or steroids necessary currently If progressive troubles or worse to follow-up Call if any issues. Give [place] update this coming Monday on how he is doing

## 2020-10-10 NOTE — Telephone Encounter (Signed)
Pt seen at facility

## 2020-10-13 ENCOUNTER — Other Ambulatory Visit: Payer: Self-pay | Admitting: Family Medicine

## 2020-10-16 ENCOUNTER — Other Ambulatory Visit: Payer: Self-pay | Admitting: Family Medicine

## 2020-11-09 ENCOUNTER — Telehealth (INDEPENDENT_AMBULATORY_CARE_PROVIDER_SITE_OTHER): Payer: Medicare Other | Admitting: Family Medicine

## 2020-11-09 ENCOUNTER — Telehealth: Payer: Self-pay | Admitting: *Deleted

## 2020-11-09 ENCOUNTER — Other Ambulatory Visit: Payer: Self-pay

## 2020-11-09 ENCOUNTER — Other Ambulatory Visit: Payer: Self-pay | Admitting: *Deleted

## 2020-11-09 DIAGNOSIS — M5440 Lumbago with sciatica, unspecified side: Secondary | ICD-10-CM

## 2020-11-09 DIAGNOSIS — F411 Generalized anxiety disorder: Secondary | ICD-10-CM

## 2020-11-09 DIAGNOSIS — G8929 Other chronic pain: Secondary | ICD-10-CM | POA: Diagnosis not present

## 2020-11-09 DIAGNOSIS — J449 Chronic obstructive pulmonary disease, unspecified: Secondary | ICD-10-CM | POA: Diagnosis not present

## 2020-11-09 DIAGNOSIS — Z89512 Acquired absence of left leg below knee: Secondary | ICD-10-CM | POA: Diagnosis not present

## 2020-11-09 DIAGNOSIS — G4700 Insomnia, unspecified: Secondary | ICD-10-CM | POA: Diagnosis not present

## 2020-11-09 MED ORDER — BUDESONIDE-FORMOTEROL FUMARATE 160-4.5 MCG/ACT IN AERO: 2.0000 | INHALATION_SPRAY | Freq: Two times a day (BID) | RESPIRATORY_TRACT | 3 refills | Status: DC

## 2020-11-09 MED ORDER — TEMAZEPAM 15 MG PO CAPS
15.0000 mg | ORAL_CAPSULE | Freq: Every day | ORAL | 5 refills | Status: DC
Start: 2020-11-09 — End: 2021-03-12

## 2020-11-09 MED ORDER — NORTRIPTYLINE HCL 50 MG PO CAPS
100.0000 mg | ORAL_CAPSULE | Freq: Every day | ORAL | 5 refills | Status: DC
Start: 2020-11-09 — End: 2020-12-22

## 2020-11-09 MED ORDER — OMEPRAZOLE 40 MG PO CPDR
40.0000 mg | DELAYED_RELEASE_CAPSULE | Freq: Every day | ORAL | 5 refills | Status: DC
Start: 2020-11-09 — End: 2021-06-13

## 2020-11-09 NOTE — Telephone Encounter (Signed)
silvia from [facility] calling to clarify pt's inhaler. States at phone visit today they thought dr Nicki Reaper was going to change symbicort dose since he was using albuterol q 4 hours. They called pharm and asked about it and pharm said nothing was sent in and that he was already on the highest dosage of symbicort. silvia states she did not know if dr scott knew he was doing 2 puffs bid or not but he is taking 2 puffs bid.   [facility] phone number [phone]

## 2020-11-09 NOTE — Telephone Encounter (Signed)
So [name] is very on top of things I have written out orders for [name], and because they were a phone visit I taped them to the door At this point the nurse will need to take these orders and put them in Please clarify with [facility] my chart says he is on Symbicort 80-4 0.5 and my understanding there is a stronger dose of Symbicort 160-4.5 2 puffs twice daily?  Please clarify is that so?  If he is on the 80 dose then he can be changed to the 160 dose  Also to the pain medicines will need to be put in I told her I would send over documentation of this but she needs to understand that documentation is not done instantaneously Documentation often is at evening time so therefore we will have everything faxed over to them tomorrow

## 2020-11-09 NOTE — Telephone Encounter (Signed)
Called [facility] and symbicort does come in a higher strength 160. New script sent to pharm and pain meds pended for dr scott to sign. [facility] notified.

## 2020-11-09 NOTE — Progress Notes (Signed)
Subjective:    Patient ID: [name], male    DOB: [birth year], 69 y.o.   MRN: [id number]  HPI This patient was seen today for chronic pain  The medication list was reviewed and updated.   -Compliance with medication: yes  - Number patient states they take daily: 3  day  -when was the last dose patient took? today  The patient was advised the importance of maintaining medication and not using illegal substances with these.  Here for refills and follow up  The patient was educated that we can provide 3 monthly scripts for their medication, it is their responsibility to follow the instructions.  Side effects or complications from medications: none  Patient is aware that pain medications are meant to minimize the severity of the pain to allow their pain levels to improve to allow for better function. They are aware of that pain medications cannot totally remove their pain.  Due for UDT ( at least once per year) : 05/10/20  Insomnia, unspecified type  COPD mixed type ([place])  Encounter for chronic pain management  Chronic bilateral low back pain with sciatica, sciatica laterality unspecified  History of left below knee amputation (HCC)  Generalized anxiety disorder    Should he stay on albuterol 4 times a day he finds that his breathing intermittently is having a rough time other times is doing better-we did discuss his COPD.  He is on a lower dose Spiriva we will bump up the dose of this   Virtual Visit via Video Note  I connected with [name] on 11/09/20 at 10:00 AM EST by a video enabled telemedicine application and verified that I am speaking with the correct person using two identifiers.  Location: Patient: home Provider: office   I discussed the limitations of evaluation and management by telemedicine and the availability of in person appointments. The patient expressed understanding and agreed to proceed.  History of Present Illness:     Observations/Objective:   Assessment and Plan:   Follow Up Instructions:    I discussed the assessment and treatment plan with the patient. The patient was provided an opportunity to ask questions and all were answered. The patient agreed with the plan and demonstrated an understanding of the instructions.   The patient was advised to call back or seek an in-person evaluation if the symptoms worsen or if the condition fails to improve as anticipated.  I provided 25 including chart review documentation and discussion with the patient minutes of non-face-to-face time during this encounter.      Review of Systems  Constitutional: Negative for activity change, fatigue and fever.  HENT: Negative for congestion and rhinorrhea.   Respiratory: Negative for cough and shortness of breath.   Cardiovascular: Negative for chest pain and leg swelling.  Gastrointestinal: Negative for abdominal pain, diarrhea and nausea.  Genitourinary: Negative for dysuria and hematuria.  Neurological: Negative for weakness and headaches.  Psychiatric/Behavioral: Negative for agitation and behavioral problems.       Objective:   Physical Exam  Today's visit was via telephone Physical exam was not possible for this visit       Assessment & Plan:  We will fax documentation to [facility] [phone] 1. Insomnia, unspecified type Takes his temazepam to help with sleep.  It is necessary for him to utilize this medicine.  Without it he does not sleep.  2. COPD mixed type (HCC) We will increase the strength of the Symbicort twice daily hopefully this  will lessen how often he has to use the albuterol.  Recheck again in 3 months time  3. Encounter for chronic pain management The patient was seen in followup for chronic pain. A review over at their current pain status was discussed. Drug registry was checked. Prescriptions were given.  Regular follow-up recommended. Discussion was held regarding the  importance of compliance with medication as well as pain medication contract.  Patient was informed that medication may cause drowsiness and should not be combined  with other medications/alcohol or street drugs. If the patient feels medication is causing altered alertness then do not drive or operate dangerous equipment.  Drug registry checked 3 scripts were sent into the pharmacy for monthly distribution  4. Chronic bilateral low back pain with sciatica, sciatica laterality unspecified Uses pain medication.  Does help allow him to function better  5. History of left below knee amputation ([place]) Uses pain medicine allows him to function better denies abusing the medicine.  The center helps him stay with it  Moods are overall doing well.  Uses alprazolam and is been on it for years.  We have tapered it down to the lowest dose tolerable.  Next visit will be in 3 months in person lab work at that time  Patient defers on colonoscopy we will discuss other measures on his follow-up

## 2020-11-10 ENCOUNTER — Other Ambulatory Visit: Payer: Self-pay | Admitting: Family Medicine

## 2020-11-11 ENCOUNTER — Telehealth: Payer: Self-pay | Admitting: Family Medicine

## 2020-11-11 ENCOUNTER — Other Ambulatory Visit: Payer: Self-pay | Admitting: Nurse Practitioner

## 2020-11-11 MED ORDER — GUAIFENESIN-DM 100-10 MG/5ML PO SYRP: 5.0000 mL | ORAL_SOLUTION | ORAL | 0 refills | Status: DC | PRN

## 2020-11-11 NOTE — Telephone Encounter (Signed)
Order for Robitussin DM sent in to [facility]

## 2020-11-11 NOTE — Telephone Encounter (Signed)
Sunday Spillers from [facility] calling in to see if we can sent in a prescription for patients cough. Please advise. Thank you

## 2020-11-11 NOTE — Telephone Encounter (Signed)
Please check with [facility] pharmacy I sent them 3 prescriptions I have no idea why they are requesting refills, but if they are I can send another one to them-thank you

## 2020-11-12 MED ORDER — HYDROCODONE-ACETAMINOPHEN 5-325 MG PO TABS: ORAL_TABLET | ORAL | 0 refills | Status: DC

## 2020-11-12 MED ORDER — HYDROCODONE-ACETAMINOPHEN 5-325 MG PO TABS
ORAL_TABLET | ORAL | 0 refills | Status: DC
Start: 2020-11-12 — End: 2021-02-24

## 2020-11-12 NOTE — Addendum Note (Signed)
Addended by: Sallee Lange A on: 11/12/2020 08:44 AM   Modules accepted: Orders

## 2020-12-01 ENCOUNTER — Other Ambulatory Visit: Payer: Self-pay | Admitting: Family Medicine

## 2020-12-01 NOTE — Telephone Encounter (Signed)
Seen 11/29/20 for pain management

## 2020-12-16 DIAGNOSIS — B351 Tinea unguium: Secondary | ICD-10-CM | POA: Diagnosis not present

## 2020-12-16 DIAGNOSIS — M79675 Pain in left toe(s): Secondary | ICD-10-CM | POA: Diagnosis not present

## 2020-12-19 ENCOUNTER — Other Ambulatory Visit: Payer: Self-pay | Admitting: Family Medicine

## 2021-01-03 DIAGNOSIS — U071 COVID-19: Secondary | ICD-10-CM | POA: Diagnosis not present

## 2021-01-03 DIAGNOSIS — Z20828 Contact with and (suspected) exposure to other viral communicable diseases: Secondary | ICD-10-CM | POA: Diagnosis not present

## 2021-01-05 DIAGNOSIS — Z20828 Contact with and (suspected) exposure to other viral communicable diseases: Secondary | ICD-10-CM | POA: Diagnosis not present

## 2021-01-05 DIAGNOSIS — U071 COVID-19: Secondary | ICD-10-CM | POA: Diagnosis not present

## 2021-01-10 DIAGNOSIS — Z20828 Contact with and (suspected) exposure to other viral communicable diseases: Secondary | ICD-10-CM | POA: Diagnosis not present

## 2021-01-10 DIAGNOSIS — U071 COVID-19: Secondary | ICD-10-CM | POA: Diagnosis not present

## 2021-01-12 DIAGNOSIS — Z20828 Contact with and (suspected) exposure to other viral communicable diseases: Secondary | ICD-10-CM | POA: Diagnosis not present

## 2021-01-12 DIAGNOSIS — U071 COVID-19: Secondary | ICD-10-CM | POA: Diagnosis not present

## 2021-01-13 DIAGNOSIS — Z23 Encounter for immunization: Secondary | ICD-10-CM | POA: Diagnosis not present

## 2021-01-17 DIAGNOSIS — U071 COVID-19: Secondary | ICD-10-CM | POA: Diagnosis not present

## 2021-01-17 DIAGNOSIS — Z20828 Contact with and (suspected) exposure to other viral communicable diseases: Secondary | ICD-10-CM | POA: Diagnosis not present

## 2021-01-19 DIAGNOSIS — U071 COVID-19: Secondary | ICD-10-CM | POA: Diagnosis not present

## 2021-01-19 DIAGNOSIS — Z20828 Contact with and (suspected) exposure to other viral communicable diseases: Secondary | ICD-10-CM | POA: Diagnosis not present

## 2021-01-24 DIAGNOSIS — U071 COVID-19: Secondary | ICD-10-CM | POA: Diagnosis not present

## 2021-01-24 DIAGNOSIS — Z20828 Contact with and (suspected) exposure to other viral communicable diseases: Secondary | ICD-10-CM | POA: Diagnosis not present

## 2021-01-26 DIAGNOSIS — Z20828 Contact with and (suspected) exposure to other viral communicable diseases: Secondary | ICD-10-CM | POA: Diagnosis not present

## 2021-01-26 DIAGNOSIS — U071 COVID-19: Secondary | ICD-10-CM | POA: Diagnosis not present

## 2021-01-31 DIAGNOSIS — Z20828 Contact with and (suspected) exposure to other viral communicable diseases: Secondary | ICD-10-CM | POA: Diagnosis not present

## 2021-01-31 DIAGNOSIS — U071 COVID-19: Secondary | ICD-10-CM | POA: Diagnosis not present

## 2021-02-02 DIAGNOSIS — Z20828 Contact with and (suspected) exposure to other viral communicable diseases: Secondary | ICD-10-CM | POA: Diagnosis not present

## 2021-02-02 DIAGNOSIS — U071 COVID-19: Secondary | ICD-10-CM | POA: Diagnosis not present

## 2021-02-07 DIAGNOSIS — U071 COVID-19: Secondary | ICD-10-CM | POA: Diagnosis not present

## 2021-02-07 DIAGNOSIS — Z20828 Contact with and (suspected) exposure to other viral communicable diseases: Secondary | ICD-10-CM | POA: Diagnosis not present

## 2021-02-08 ENCOUNTER — Telehealth: Payer: Self-pay | Admitting: Family Medicine

## 2021-02-08 NOTE — Telephone Encounter (Signed)
Form from [place] regarding oxygen. Form in provider office. Pt last seen 11/03/20 for insomnia. Please advise. Thank you

## 2021-02-14 ENCOUNTER — Other Ambulatory Visit: Payer: Self-pay | Admitting: Family Medicine

## 2021-02-14 DIAGNOSIS — U071 COVID-19: Secondary | ICD-10-CM | POA: Diagnosis not present

## 2021-02-14 DIAGNOSIS — Z20828 Contact with and (suspected) exposure to other viral communicable diseases: Secondary | ICD-10-CM | POA: Diagnosis not present

## 2021-02-16 NOTE — Telephone Encounter (Signed)
Form was completed thank you

## 2021-02-21 DIAGNOSIS — Z20828 Contact with and (suspected) exposure to other viral communicable diseases: Secondary | ICD-10-CM | POA: Diagnosis not present

## 2021-02-21 DIAGNOSIS — U071 COVID-19: Secondary | ICD-10-CM | POA: Diagnosis not present

## 2021-02-24 ENCOUNTER — Other Ambulatory Visit: Payer: Self-pay

## 2021-02-24 ENCOUNTER — Encounter: Payer: Self-pay | Admitting: Family Medicine

## 2021-02-24 ENCOUNTER — Ambulatory Visit (INDEPENDENT_AMBULATORY_CARE_PROVIDER_SITE_OTHER): Payer: Medicare Other | Admitting: Family Medicine

## 2021-02-24 VITALS — BP 134/80 | HR 102 | Temp 97.9°F | Ht 72.0 in | Wt 177.8 lb

## 2021-02-24 DIAGNOSIS — E7849 Other hyperlipidemia: Secondary | ICD-10-CM | POA: Diagnosis not present

## 2021-02-24 DIAGNOSIS — J449 Chronic obstructive pulmonary disease, unspecified: Secondary | ICD-10-CM

## 2021-02-24 DIAGNOSIS — F411 Generalized anxiety disorder: Secondary | ICD-10-CM | POA: Diagnosis not present

## 2021-02-24 DIAGNOSIS — G8929 Other chronic pain: Secondary | ICD-10-CM

## 2021-02-24 DIAGNOSIS — Z125 Encounter for screening for malignant neoplasm of prostate: Secondary | ICD-10-CM

## 2021-02-24 DIAGNOSIS — Z79899 Other long term (current) drug therapy: Secondary | ICD-10-CM | POA: Diagnosis not present

## 2021-02-24 DIAGNOSIS — Z136 Encounter for screening for cardiovascular disorders: Secondary | ICD-10-CM | POA: Diagnosis not present

## 2021-02-24 DIAGNOSIS — M5440 Lumbago with sciatica, unspecified side: Secondary | ICD-10-CM

## 2021-02-24 DIAGNOSIS — Z89512 Acquired absence of left leg below knee: Secondary | ICD-10-CM | POA: Diagnosis not present

## 2021-02-24 DIAGNOSIS — M79604 Pain in right leg: Secondary | ICD-10-CM | POA: Diagnosis not present

## 2021-02-24 DIAGNOSIS — I700 Atherosclerosis of aorta: Secondary | ICD-10-CM

## 2021-02-24 MED ORDER — HYDROCODONE-ACETAMINOPHEN 5-325 MG PO TABS: ORAL_TABLET | ORAL | 0 refills | Status: DC

## 2021-02-24 MED ORDER — HYDROCODONE-ACETAMINOPHEN 5-325 MG PO TABS
ORAL_TABLET | ORAL | 0 refills | Status: DC
Start: 2021-02-24 — End: 2021-02-27

## 2021-02-24 NOTE — Progress Notes (Signed)
Subjective:    Patient ID: [name], male    DOB: November 26, 1951, 70 y.o.   MRN: [id number]  HPI This patient was seen today for chronic pain  The medication list was reviewed and updated.   -Compliance with medication: takes 3 a day  - Number patient states they take daily: 3 a day  -when was the last dose patient took?  today  The patient was advised the importance of maintaining medication and not using illegal substances with these.  Here for refills and follow up  The patient was educated that we can provide 3 monthly scripts for their medication, it is their responsibility to follow the instructions.  Side effects or complications from medications:  None  Patient is aware that pain medications are meant to minimize the severity of the pain to allow their pain levels to improve to allow for better function. They are aware of that pain medications cannot totally remove their pain.  Due for UDT ( at least once per year) : last one 05/10/20  Scale of 1 to 10 ( 1 is least 10 is most) Your pain level without the medicine: 7 Your pain level with medication: 3  Scale 1 to 10 ( 1-helps very little, 10 helps very well) How well does your pain medication reduce your pain so you can function better through out the day?  7  Patient does have underlying anxiety takes his Xanax on a regular basis he was under the care of psychiatry is no longer under the care of psychiatry lives at assisted living.  He has been on this medication for years we will maintain as is  He still smokes he has been encouraged to quit smoking.  He is on Symbicort to help him with his lungs.      Review of Systems  Constitutional: Negative for activity change, fatigue and fever.  HENT: Negative for congestion and rhinorrhea.   Respiratory: Negative for cough and shortness of breath.   Cardiovascular: Negative for chest pain and leg swelling.  Gastrointestinal: Negative for abdominal pain, diarrhea and  nausea.  Genitourinary: Negative for dysuria and hematuria.  Neurological: Negative for weakness and headaches.  Psychiatric/Behavioral: Negative for agitation and behavioral problems.       Objective:   Physical Exam Vitals reviewed.  Cardiovascular:     Rate and Rhythm: Normal rate and regular rhythm.     Heart sounds: Normal heart sounds. No murmur heard.   Pulmonary:     Effort: Pulmonary effort is normal.     Breath sounds: Normal breath sounds.  Lymphadenopathy:     Cervical: No cervical adenopathy.  Neurological:     Mental Status: He is alert.  Psychiatric:        Behavior: Behavior normal.           Assessment & Plan:  1. Chronic bilateral low back pain with sciatica, sciatica laterality unspecified The patient was seen in followup for chronic pain. A review over at their current pain status was discussed. Drug registry was checked. Prescriptions were given.  Regular follow-up recommended. Discussion was held regarding the importance of compliance with medication as well as pain medication contract.  Patient was informed that medication may cause drowsiness and should not be combined  with other medications/alcohol or street drugs. If the patient feels medication is causing altered alertness then do not drive or operate dangerous equipment.    2. History of left below knee amputation ([place]) So far his stump  as well as his prosthesis are doing well  3. Chronic pain of right lower extremity Patient takes his hydrocodone 3 times daily due to phantom pains with the stump as well as his back he has been on this for years very stable does not abuse the medicine it is dispensed by the rest home medical staff Drug registry checked  4. Generalized anxiety disorder Patient has severe anxiety he has had this for years well beyond when I first started taking care of him.  Initially he was going to psychiatry who has had him on this medicine but now he follows through  with [place] under no counseling currently  5. Other hyperlipidemia History hyperlipidemia check lipid profile  6. COPD mixed type ([place]) Strongly encouraged him to quit smoking unfortunately the likelihood of him doing so is low he is down to 2 cigarettes/day which is a plus  7. Screening for AAA (abdominal aortic aneurysm) Screening for aneurysm Recheck for wellness a.m. pain medicine in 3 months  Patient will do his lab work.  Strong consideration for starting statins upon follow-up of labs

## 2021-02-26 ENCOUNTER — Encounter: Payer: Self-pay | Admitting: Family Medicine

## 2021-02-26 DIAGNOSIS — I700 Atherosclerosis of aorta: Secondary | ICD-10-CM | POA: Insufficient documentation

## 2021-02-26 HISTORY — DX: Atherosclerosis of aorta: I70.0

## 2021-02-27 ENCOUNTER — Other Ambulatory Visit: Payer: Self-pay | Admitting: *Deleted

## 2021-02-27 MED ORDER — HYDROCODONE-ACETAMINOPHEN 5-325 MG PO TABS: ORAL_TABLET | ORAL | 0 refills | Status: DC

## 2021-02-27 MED ORDER — HYDROCODONE-ACETAMINOPHEN 5-325 MG PO TABS
ORAL_TABLET | ORAL | 0 refills | Status: DC
Start: 2021-02-27 — End: 2021-07-24

## 2021-02-27 NOTE — Telephone Encounter (Signed)
Phillip from [facility] calling because pain meds were sent [facility]. I canceled at [place] and pended 3 scripts for you to resend.

## 2021-02-28 ENCOUNTER — Telehealth: Payer: Self-pay | Admitting: Family Medicine

## 2021-03-02 DIAGNOSIS — Z20828 Contact with and (suspected) exposure to other viral communicable diseases: Secondary | ICD-10-CM | POA: Diagnosis not present

## 2021-03-02 DIAGNOSIS — U071 COVID-19: Secondary | ICD-10-CM | POA: Diagnosis not present

## 2021-03-07 DIAGNOSIS — M79675 Pain in left toe(s): Secondary | ICD-10-CM | POA: Diagnosis not present

## 2021-03-07 DIAGNOSIS — U071 COVID-19: Secondary | ICD-10-CM | POA: Diagnosis not present

## 2021-03-07 DIAGNOSIS — Z20828 Contact with and (suspected) exposure to other viral communicable diseases: Secondary | ICD-10-CM | POA: Diagnosis not present

## 2021-03-07 DIAGNOSIS — B351 Tinea unguium: Secondary | ICD-10-CM | POA: Diagnosis not present

## 2021-03-09 ENCOUNTER — Ambulatory Visit (HOSPITAL_COMMUNITY)
Admission: RE | Admit: 2021-03-09 | Discharge: 2021-03-09 | Disposition: A | Discharge status: Home / Self Care | Payer: Medicare Other | Source: Ambulatory Visit | Attending: Family Medicine | Admitting: Family Medicine

## 2021-03-09 ENCOUNTER — Ambulatory Visit (HOSPITAL_COMMUNITY): Payer: Medicare Other

## 2021-03-09 ENCOUNTER — Telehealth: Payer: Self-pay

## 2021-03-09 DIAGNOSIS — Z136 Encounter for screening for cardiovascular disorders: Secondary | ICD-10-CM | POA: Insufficient documentation

## 2021-03-09 DIAGNOSIS — E785 Hyperlipidemia, unspecified: Secondary | ICD-10-CM | POA: Diagnosis not present

## 2021-03-09 NOTE — Telephone Encounter (Signed)
[employer] called and said that insurance is deny oxygen because on diagnoses was Covid and they don't have where he has had SAP test.   Kimber Relic from [place]  [phone]

## 2021-03-09 NOTE — Telephone Encounter (Signed)
Returned call to [employer] with [employer] , currently unavailable will call back at a later time today.

## 2021-03-09 NOTE — Telephone Encounter (Signed)
Patient states he has not been on oxygen in over a year [facility] notified and stated they will work on getting oxygen picked up

## 2021-03-09 NOTE — Telephone Encounter (Signed)
Signed the form it is ready thank you

## 2021-03-09 NOTE — Telephone Encounter (Signed)
[facility] states they need a d/c order for oxygen. Script written out and on provider door for signature. Please advise. Thank you

## 2021-03-09 NOTE — Telephone Encounter (Signed)
If we need to write an order please do so thank you

## 2021-03-10 ENCOUNTER — Encounter: Payer: Self-pay | Admitting: Family Medicine

## 2021-03-10 ENCOUNTER — Other Ambulatory Visit: Payer: Self-pay | Admitting: Family Medicine

## 2021-03-10 NOTE — Telephone Encounter (Signed)
Order faxed to [facility]

## 2021-03-10 NOTE — Telephone Encounter (Signed)
Seen 02/24/21

## 2021-03-14 ENCOUNTER — Telehealth: Payer: Self-pay | Admitting: Family Medicine

## 2021-03-14 DIAGNOSIS — Z20828 Contact with and (suspected) exposure to other viral communicable diseases: Secondary | ICD-10-CM | POA: Diagnosis not present

## 2021-03-14 DIAGNOSIS — U071 COVID-19: Secondary | ICD-10-CM | POA: Diagnosis not present

## 2021-03-14 NOTE — Telephone Encounter (Signed)
Your ultrasound of the aorta looks normal.  This is good news.  We will see you at your next visit.  [facility]-Dr. Nicki Reaper (from 03/10/21 letter sent to patient)  Patient notified and verbalized understanding.

## 2021-03-14 NOTE — Telephone Encounter (Signed)
Patient wanting results of ultra sound that was done 4/7 at [facility]

## 2021-03-21 DIAGNOSIS — U071 COVID-19: Secondary | ICD-10-CM | POA: Diagnosis not present

## 2021-03-21 DIAGNOSIS — Z20828 Contact with and (suspected) exposure to other viral communicable diseases: Secondary | ICD-10-CM | POA: Diagnosis not present

## 2021-03-28 DIAGNOSIS — Z20828 Contact with and (suspected) exposure to other viral communicable diseases: Secondary | ICD-10-CM | POA: Diagnosis not present

## 2021-03-28 DIAGNOSIS — U071 COVID-19: Secondary | ICD-10-CM | POA: Diagnosis not present

## 2021-04-04 DIAGNOSIS — U071 COVID-19: Secondary | ICD-10-CM | POA: Diagnosis not present

## 2021-04-04 DIAGNOSIS — Z20828 Contact with and (suspected) exposure to other viral communicable diseases: Secondary | ICD-10-CM | POA: Diagnosis not present

## 2021-04-07 ENCOUNTER — Ambulatory Visit (HOSPITAL_COMMUNITY)
Admission: RE | Admit: 2021-04-07 | Discharge: 2021-04-07 | Disposition: A | Discharge status: Home / Self Care | Payer: Medicare Other | Source: Ambulatory Visit | Attending: Family Medicine | Admitting: Family Medicine

## 2021-04-07 ENCOUNTER — Ambulatory Visit (INDEPENDENT_AMBULATORY_CARE_PROVIDER_SITE_OTHER): Payer: Medicare Other | Admitting: Family Medicine

## 2021-04-07 ENCOUNTER — Other Ambulatory Visit: Payer: Self-pay

## 2021-04-07 ENCOUNTER — Encounter: Payer: Self-pay | Admitting: Family Medicine

## 2021-04-07 VITALS — HR 115 | Temp 99.1°F | Ht 72.0 in | Wt 175.0 lb

## 2021-04-07 DIAGNOSIS — R058 Other specified cough: Secondary | ICD-10-CM

## 2021-04-07 DIAGNOSIS — R0689 Other abnormalities of breathing: Secondary | ICD-10-CM

## 2021-04-07 DIAGNOSIS — J019 Acute sinusitis, unspecified: Secondary | ICD-10-CM | POA: Insufficient documentation

## 2021-04-07 DIAGNOSIS — J209 Acute bronchitis, unspecified: Secondary | ICD-10-CM | POA: Insufficient documentation

## 2021-04-07 DIAGNOSIS — J40 Bronchitis, not specified as acute or chronic: Secondary | ICD-10-CM | POA: Insufficient documentation

## 2021-04-07 DIAGNOSIS — R059 Cough, unspecified: Secondary | ICD-10-CM | POA: Diagnosis not present

## 2021-04-07 DIAGNOSIS — J440 Chronic obstructive pulmonary disease with acute lower respiratory infection: Secondary | ICD-10-CM | POA: Insufficient documentation

## 2021-04-07 MED ORDER — AMOXICILLIN-POT CLAVULANATE 875-125 MG PO TABS: 1.0000 | ORAL_TABLET | Freq: Two times a day (BID) | ORAL | 0 refills | Status: DC

## 2021-04-07 NOTE — Progress Notes (Signed)
Patient ID: MOUSA PROUT, male    DOB: [birth year], 70 y.o.   MRN: [id number]   Chief Complaint  Patient presents with  . Cough    With congestion, headache x 1 week, tired, weakness, lack of appetite   Subjective:  CC: cough, congestion, headache, fatigue, low appetitie  This is a new problem.  Presents today for an acute visit with a complaint of cough, congestion, headache, extreme fatigue, weakness, and decreased appetite.  Symptoms have been present for 1 week.  Lives at an assisted living facility, gets COVID tested twice per week.  No known COVID outbreaks currently at facility.  See review of systems.  Medications include albuterol inhaler Symbicort inhaler, reports wheezing and productive sputum.    Medical History Laren has a past medical history of Anxiety disorder, Aortic atherosclerosis ([place]) (02/26/2021), Chronic insomnia, Chronic low back pain, COPD (chronic obstructive pulmonary disease) ([place]), GERD (gastroesophageal reflux disease) (08/29/2017), right BKA ([place]), and Hyperlipidemia.   Outpatient Encounter Medications as of 04/07/2021  Medication Sig  . amoxicillin-clavulanate (AUGMENTIN) 875-125 MG tablet Take 1 tablet by mouth 2 (two) times daily.  Marland Kitchen ALPRAZolam (XANAX) 0.5 MG tablet TAKE 1 TABLET BY MOUTH 2 TIMES A DAY. (1 AT 8AM & 1 AT 2PM.)  . budesonide-formoterol (SYMBICORT) 160-4.5 MCG/ACT inhaler Inhale 2 puffs into the lungs 2 (two) times daily.  Marland Kitchen guaiFENesin-dextromethorphan (ROBITUSSIN DM) 100-10 MG/5ML syrup Take 5 mLs by mouth every 4 (four) hours as needed for cough.  Marland Kitchen HYDROcodone-acetaminophen (NORCO/VICODIN) 5-325 MG tablet TAKE 1 TABLET BY MOUTH 3 TIMES DAILY AT 8am, 2pm & 8pm.  . HYDROcodone-acetaminophen (NORCO/VICODIN) 5-325 MG tablet One at 8am,2pm,8pm  . HYDROcodone-acetaminophen (NORCO/VICODIN) 5-325 MG tablet TAKE 1 TABLET BY MOUTH 3 TIMES DAILY AT 8am, 2pm & 8pm.  . hyoscyamine (LEVSIN SL) 0.125 MG SL tablet PLACE 1 TABLET UNDER THE TONGUE EVERY 6  HOURS AS NEEDED FOR CRAMPS  . nortriptyline (PAMELOR) 50 MG capsule TAKE 2 CAPSULES BY MOUTH AT BEDTIME.  Marland Kitchen omeprazole (PRILOSEC) 40 MG capsule Take 1 capsule (40 mg total) by mouth daily.  Marland Kitchen PAIN RELIEF EXTRA STRENGTH 500 MG tablet TAKE 1 TABLET BY MOUTH EVERY 6 HOURS AS NEEDED FOR PAIN, OR HEADACHE.DO NOT EXCEED 3 A DAY.  Marland Kitchen temazepam (RESTORIL) 15 MG capsule TAKE 1 CAPSULE BY MOUTH AT BEDTIME.  . VENTOLIN HFA 108 (90 Base) MCG/ACT inhaler INHALE 2 PUFFS INTO THE LUNGS EVERY 4 HOURS AS NEEDED FOR WHEEZING.   No facility-administered encounter medications on file as of 04/07/2021.     Review of Systems  Constitutional: Positive for appetite change, chills and fatigue. Negative for fever.  HENT: Positive for congestion. Negative for ear pain, sinus pressure, sinus pain and sore throat.   Respiratory: Positive for cough and shortness of breath. Negative for wheezing.        Current smoker, gets SOB.   Cardiovascular: Negative for chest pain and leg swelling.  Gastrointestinal: Negative for abdominal pain.  Genitourinary: Negative for dysuria.  Musculoskeletal: Negative for myalgias.  Neurological: Positive for headaches.     Vitals Pulse (!) 115   Temp 99.1 F (37.3 C)   Ht 6' (1.829 m)   Wt 175 lb (79.4 kg)   SpO2 90%   BMI 23.73 kg/m   Objective:   Physical Exam Vitals reviewed.  Constitutional:      Appearance: He is ill-appearing.  HENT:     Right Ear: Tympanic membrane normal.     Left Ear: Tympanic membrane normal.  Nose:     Right Sinus: Frontal sinus tenderness present.     Left Sinus: Frontal sinus tenderness present.     Mouth/Throat:     Pharynx: Posterior oropharyngeal erythema present.  Cardiovascular:     Rate and Rhythm: Normal rate and regular rhythm.     Heart sounds: Normal heart sounds.  Pulmonary:     Effort: Pulmonary effort is normal.     Breath sounds: Examination of the right-middle field reveals wheezing. Examination of the left-middle  field reveals wheezing. Examination of the right-lower field reveals decreased breath sounds. Examination of the left-lower field reveals decreased breath sounds. Decreased breath sounds and wheezing present.     Comments: Productive sputum: yellow, gray, green  Skin:    General: Skin is warm and dry.  Neurological:     General: No focal deficit present.     Mental Status: He is alert.  Psychiatric:        Behavior: Behavior normal.      Assessment and Plan   1. Decreased breath sounds - DG Chest 2 View  2. Cough productive of purulent sputum - DG Chest 2 View  3. Acute rhinosinusitis - amoxicillin-clavulanate (AUGMENTIN) 875-125 MG tablet; Take 1 tablet by mouth 2 (two) times daily.  Dispense: 20 tablet; Refill: 0   Physical exam reveals diminished breath sounds throughout and expiratory wheezing in the mid lobe bilaterally.  Will send to [facility] for stat chest x-ray to rule out pneumonia.  Some frontal sinus tenderness we will treat for acute rhinosinusitis with antibiotics for 10 days.  Update: Chest x-ray findings include interstitial thickening throughout lungs bilaterally, no edema or airspace opacity.  Chronic bronchitis.  Agrees with plan of care discussed today. Understands warning signs to seek further care: chest pain, shortness of breath, any significant change in health.  Understands to follow-up if symptoms worsen or do not improve.    Pecolia Ades, NP 04/07/2021

## 2021-04-11 DIAGNOSIS — U071 COVID-19: Secondary | ICD-10-CM | POA: Diagnosis not present

## 2021-04-11 DIAGNOSIS — Z20828 Contact with and (suspected) exposure to other viral communicable diseases: Secondary | ICD-10-CM | POA: Diagnosis not present

## 2021-04-18 DIAGNOSIS — Z20828 Contact with and (suspected) exposure to other viral communicable diseases: Secondary | ICD-10-CM | POA: Diagnosis not present

## 2021-04-18 DIAGNOSIS — U071 COVID-19: Secondary | ICD-10-CM | POA: Diagnosis not present

## 2021-04-25 DIAGNOSIS — Z20828 Contact with and (suspected) exposure to other viral communicable diseases: Secondary | ICD-10-CM | POA: Diagnosis not present

## 2021-04-25 DIAGNOSIS — U071 COVID-19: Secondary | ICD-10-CM | POA: Diagnosis not present

## 2021-05-03 DIAGNOSIS — Z20828 Contact with and (suspected) exposure to other viral communicable diseases: Secondary | ICD-10-CM | POA: Diagnosis not present

## 2021-05-03 DIAGNOSIS — U071 COVID-19: Secondary | ICD-10-CM | POA: Diagnosis not present

## 2021-05-04 DIAGNOSIS — M5440 Lumbago with sciatica, unspecified side: Secondary | ICD-10-CM | POA: Diagnosis not present

## 2021-05-04 DIAGNOSIS — Z89512 Acquired absence of left leg below knee: Secondary | ICD-10-CM | POA: Diagnosis not present

## 2021-05-04 DIAGNOSIS — Z136 Encounter for screening for cardiovascular disorders: Secondary | ICD-10-CM | POA: Diagnosis not present

## 2021-05-04 DIAGNOSIS — Z125 Encounter for screening for malignant neoplasm of prostate: Secondary | ICD-10-CM | POA: Diagnosis not present

## 2021-05-04 DIAGNOSIS — E7849 Other hyperlipidemia: Secondary | ICD-10-CM | POA: Diagnosis not present

## 2021-05-04 DIAGNOSIS — M79604 Pain in right leg: Secondary | ICD-10-CM | POA: Diagnosis not present

## 2021-05-04 DIAGNOSIS — F411 Generalized anxiety disorder: Secondary | ICD-10-CM | POA: Diagnosis not present

## 2021-05-04 DIAGNOSIS — G8929 Other chronic pain: Secondary | ICD-10-CM | POA: Diagnosis not present

## 2021-05-04 DIAGNOSIS — Z79899 Other long term (current) drug therapy: Secondary | ICD-10-CM | POA: Diagnosis not present

## 2021-05-04 DIAGNOSIS — J449 Chronic obstructive pulmonary disease, unspecified: Secondary | ICD-10-CM | POA: Diagnosis not present

## 2021-05-05 ENCOUNTER — Encounter: Payer: Self-pay | Admitting: Family Medicine

## 2021-05-05 LAB — LIPID PANEL
Chol/HDL Ratio: 5.2 ratio — ABNORMAL HIGH (ref 0.0–5.0)
Cholesterol, Total: 187 mg/dL (ref 100–199)
HDL: 36 mg/dL — ABNORMAL LOW (ref >39)
LDL Chol Calc (NIH): 111 mg/dL — ABNORMAL HIGH (ref 0–99)
Triglycerides: 232 mg/dL — ABNORMAL HIGH (ref 0–149)
VLDL Cholesterol Cal: 40 mg/dL (ref 5–40)

## 2021-05-05 LAB — HEPATIC FUNCTION PANEL
ALT: 9 IU/L (ref 0–44)
AST: 14 IU/L (ref 0–40)
Albumin: 4.5 g/dL (ref 3.8–4.8)
Alkaline Phosphatase: 91 IU/L (ref 44–121)
Bilirubin Total: 0.5 mg/dL (ref 0.0–1.2)
Bilirubin, Direct: 0.14 mg/dL (ref 0.00–0.40)
Total Protein: 7.8 g/dL (ref 6.0–8.5)

## 2021-05-05 LAB — BASIC METABOLIC PANEL
BUN/Creatinine Ratio: 13 (ref 10–24)
BUN: 11 mg/dL (ref 8–27)
CO2: 27 mmol/L (ref 20–29)
Calcium: 9.8 mg/dL (ref 8.6–10.2)
Chloride: 95 mmol/L — ABNORMAL LOW (ref 96–106)
Creatinine, Ser: 0.83 mg/dL (ref 0.76–1.27)
Glucose: 119 mg/dL — ABNORMAL HIGH (ref 65–99)
Potassium: 4.8 mmol/L (ref 3.5–5.2)
Sodium: 139 mmol/L (ref 134–144)
eGFR: 96 mL/min/{1.73_m2} (ref >59)

## 2021-05-05 LAB — PSA: Prostate Specific Ag, Serum: 0.7 ng/mL (ref 0.0–4.0)

## 2021-05-09 DIAGNOSIS — U071 COVID-19: Secondary | ICD-10-CM | POA: Diagnosis not present

## 2021-05-09 DIAGNOSIS — Z20828 Contact with and (suspected) exposure to other viral communicable diseases: Secondary | ICD-10-CM | POA: Diagnosis not present

## 2021-05-09 DIAGNOSIS — B351 Tinea unguium: Secondary | ICD-10-CM | POA: Diagnosis not present

## 2021-05-09 DIAGNOSIS — M79675 Pain in left toe(s): Secondary | ICD-10-CM | POA: Diagnosis not present

## 2021-05-11 ENCOUNTER — Encounter: Payer: Medicare Other | Admitting: Family Medicine

## 2021-05-12 ENCOUNTER — Other Ambulatory Visit: Payer: Self-pay | Admitting: Family Medicine

## 2021-05-16 DIAGNOSIS — U071 COVID-19: Secondary | ICD-10-CM | POA: Diagnosis not present

## 2021-05-16 DIAGNOSIS — Z20828 Contact with and (suspected) exposure to other viral communicable diseases: Secondary | ICD-10-CM | POA: Diagnosis not present

## 2021-05-23 DIAGNOSIS — Z20828 Contact with and (suspected) exposure to other viral communicable diseases: Secondary | ICD-10-CM | POA: Diagnosis not present

## 2021-05-23 DIAGNOSIS — U071 COVID-19: Secondary | ICD-10-CM | POA: Diagnosis not present

## 2021-05-30 DIAGNOSIS — Z20828 Contact with and (suspected) exposure to other viral communicable diseases: Secondary | ICD-10-CM | POA: Diagnosis not present

## 2021-05-30 DIAGNOSIS — U071 COVID-19: Secondary | ICD-10-CM | POA: Diagnosis not present

## 2021-06-06 DIAGNOSIS — U071 COVID-19: Secondary | ICD-10-CM | POA: Diagnosis not present

## 2021-06-06 DIAGNOSIS — Z20828 Contact with and (suspected) exposure to other viral communicable diseases: Secondary | ICD-10-CM | POA: Diagnosis not present

## 2021-06-13 ENCOUNTER — Other Ambulatory Visit: Payer: Self-pay

## 2021-06-13 ENCOUNTER — Telehealth (INDEPENDENT_AMBULATORY_CARE_PROVIDER_SITE_OTHER): Payer: Medicare Other | Admitting: Family Medicine

## 2021-06-13 DIAGNOSIS — Z20828 Contact with and (suspected) exposure to other viral communicable diseases: Secondary | ICD-10-CM | POA: Diagnosis not present

## 2021-06-13 DIAGNOSIS — Z79891 Long term (current) use of opiate analgesic: Secondary | ICD-10-CM | POA: Diagnosis not present

## 2021-06-13 DIAGNOSIS — U071 COVID-19: Secondary | ICD-10-CM | POA: Diagnosis not present

## 2021-06-13 DIAGNOSIS — G8929 Other chronic pain: Secondary | ICD-10-CM | POA: Diagnosis not present

## 2021-06-13 MED ORDER — NORTRIPTYLINE HCL 50 MG PO CAPS: 100.0000 mg | ORAL_CAPSULE | Freq: Every day | ORAL | 5 refills | Status: AC

## 2021-06-13 MED ORDER — TEMAZEPAM 15 MG PO CAPS: 15.0000 mg | ORAL_CAPSULE | Freq: Every day | ORAL | 3 refills | Status: DC

## 2021-06-13 MED ORDER — ALPRAZOLAM 0.5 MG PO TABS: ORAL_TABLET | ORAL | 5 refills | Status: DC

## 2021-06-13 MED ORDER — OMEPRAZOLE 40 MG PO CPDR: 40.0000 mg | DELAYED_RELEASE_CAPSULE | Freq: Every day | ORAL | 5 refills | Status: AC

## 2021-06-13 MED ORDER — HYDROCODONE-ACETAMINOPHEN 5-325 MG PO TABS: ORAL_TABLET | ORAL | 0 refills | Status: DC

## 2021-06-13 NOTE — Progress Notes (Addendum)
   Subjective:    Patient ID: [name], male    DOB: 08-Nov-1951, 70 y.o.   MRN: [id number]  HPI  Virtual Visit via Video Note  I connected with [name] on 07/19/21 at  1:30 PM EDT by a video enabled telemedicine application and verified that I am speaking with the correct person using two identifiers.  Location: Patient: rest home Provider: office   I discussed the limitations of evaluation and management by telemedicine and the availability of in person appointments. The patient expressed understanding and agreed to proceed.  History of Present Illness:    Observations/Objective:   Assessment and Plan:   Follow Up Instructions:    I discussed the assessment and treatment plan with the patient. The patient was provided an opportunity to ask questions and all were answered. The patient agreed with the plan and demonstrated an understanding of the instructions.   The patient was advised to call back or seek an in-person evaluation if the symptoms worsen or if the condition fails to improve as anticipated.  I provided 15 minutes of non-face-to-face time during this encounter.   Gerald Lange, MD  This patient was seen today for chronic pain  The medication list was reviewed and updated.  Location of Pain for which the patient has been treated with regarding narcotics: legs and back  Onset of this pain: years   -Compliance with medication: yes  - Number patient states they take daily: 3 a day  -when was the last dose patient took? At lunch  The patient was advised the importance of maintaining medication and not using illegal substances with these.  Here for refills and follow up  The patient was educated that we can provide 3 monthly scripts for their medication, it is their responsibility to follow the instructions.  Side effects or complications from medications: no  Patient is aware that pain medications are meant to minimize the severity of the pain  to allow their pain levels to improve to allow for better function. They are aware of that pain medications cannot totally remove their pain.  Due for UDT ( at least once per year) : phone visit  Scale of 1 to 10 ( 1 is least 10 is most) Your pain level without the medicine: 6-7 Your pain level with medication 1-2  Scale 1 to 10 ( 1-helps very little, 10 helps very well) How well does your pain medication reduce your pain so you can function better through out the day? Helps alot  Quality of the pain: ache  Persistence of the pain: most of the time  Modifying factors: medication helps        Review of Systems     Objective:   Physical Exam Today's visit was via telephone Physical exam was not possible for this visit        Assessment & Plan:  The patient was seen in followup for chronic pain. A review over at their current pain status was discussed. Drug registry was checked. Prescriptions were given.  Regular follow-up recommended. Discussion was held regarding the importance of compliance with medication as well as pain medication contract.  Patient was informed that medication may cause drowsiness and should not be combined  with other medications/alcohol or street drugs. If the patient feels medication is causing altered alertness then do not drive or operate dangerous equipment.  Patient has wellness exam in August he will keep this appointment  Telephone visit

## 2021-06-20 DIAGNOSIS — Z20828 Contact with and (suspected) exposure to other viral communicable diseases: Secondary | ICD-10-CM | POA: Diagnosis not present

## 2021-06-20 DIAGNOSIS — U071 COVID-19: Secondary | ICD-10-CM | POA: Diagnosis not present

## 2021-06-27 DIAGNOSIS — U071 COVID-19: Secondary | ICD-10-CM | POA: Diagnosis not present

## 2021-06-27 DIAGNOSIS — Z20828 Contact with and (suspected) exposure to other viral communicable diseases: Secondary | ICD-10-CM | POA: Diagnosis not present

## 2021-07-04 ENCOUNTER — Other Ambulatory Visit: Payer: Self-pay

## 2021-07-04 ENCOUNTER — Emergency Department (HOSPITAL_COMMUNITY): Payer: Medicare Other

## 2021-07-04 ENCOUNTER — Encounter (HOSPITAL_COMMUNITY): Payer: Self-pay | Admitting: *Deleted

## 2021-07-04 ENCOUNTER — Emergency Department (HOSPITAL_COMMUNITY)
Admission: EM | Admit: 2021-07-04 | Discharge: 2021-07-04 | Disposition: A | Discharge status: Home / Self Care | Payer: Medicare Other | Attending: Emergency Medicine | Admitting: Emergency Medicine

## 2021-07-04 ENCOUNTER — Telehealth: Payer: Self-pay | Admitting: Family Medicine

## 2021-07-04 DIAGNOSIS — F17210 Nicotine dependence, cigarettes, uncomplicated: Secondary | ICD-10-CM | POA: Diagnosis not present

## 2021-07-04 DIAGNOSIS — Z20822 Contact with and (suspected) exposure to covid-19: Secondary | ICD-10-CM | POA: Insufficient documentation

## 2021-07-04 DIAGNOSIS — K219 Gastro-esophageal reflux disease without esophagitis: Secondary | ICD-10-CM | POA: Diagnosis not present

## 2021-07-04 DIAGNOSIS — Z8616 Personal history of COVID-19: Secondary | ICD-10-CM | POA: Insufficient documentation

## 2021-07-04 DIAGNOSIS — J181 Lobar pneumonia, unspecified organism: Secondary | ICD-10-CM | POA: Insufficient documentation

## 2021-07-04 DIAGNOSIS — R079 Chest pain, unspecified: Secondary | ICD-10-CM | POA: Diagnosis not present

## 2021-07-04 DIAGNOSIS — R059 Cough, unspecified: Secondary | ICD-10-CM | POA: Diagnosis not present

## 2021-07-04 DIAGNOSIS — U071 COVID-19: Secondary | ICD-10-CM | POA: Diagnosis not present

## 2021-07-04 DIAGNOSIS — R0789 Other chest pain: Secondary | ICD-10-CM | POA: Diagnosis not present

## 2021-07-04 DIAGNOSIS — R000 Tachycardia, unspecified: Secondary | ICD-10-CM | POA: Diagnosis not present

## 2021-07-04 DIAGNOSIS — J449 Chronic obstructive pulmonary disease, unspecified: Secondary | ICD-10-CM | POA: Diagnosis not present

## 2021-07-04 DIAGNOSIS — J189 Pneumonia, unspecified organism: Secondary | ICD-10-CM | POA: Diagnosis not present

## 2021-07-04 DIAGNOSIS — Z20828 Contact with and (suspected) exposure to other viral communicable diseases: Secondary | ICD-10-CM | POA: Diagnosis not present

## 2021-07-04 LAB — HEPATIC FUNCTION PANEL
ALT: 12 U/L (ref 0–44)
AST: 20 U/L (ref 15–41)
Albumin: 3.3 g/dL — ABNORMAL LOW (ref 3.5–5.0)
Alkaline Phosphatase: 89 U/L (ref 38–126)
Bilirubin, Direct: 0.1 mg/dL (ref 0.0–0.2)
Indirect Bilirubin: 0.4 mg/dL (ref 0.3–0.9)
Total Bilirubin: 0.5 mg/dL (ref 0.3–1.2)
Total Protein: 8.1 g/dL (ref 6.5–8.1)

## 2021-07-04 LAB — BASIC METABOLIC PANEL
Anion gap: 10 (ref 5–15)
BUN: 9 mg/dL (ref 8–23)
CO2: 29 mmol/L (ref 22–32)
Calcium: 9.1 mg/dL (ref 8.9–10.3)
Chloride: 97 mmol/L — ABNORMAL LOW (ref 98–111)
Creatinine, Ser: 0.73 mg/dL (ref 0.61–1.24)
GFR, Estimated: >60 mL/min (ref >60)
Glucose, Bld: 126 mg/dL — ABNORMAL HIGH (ref 70–99)
Potassium: 3.7 mmol/L (ref 3.5–5.1)
Sodium: 136 mmol/L (ref 135–145)

## 2021-07-04 LAB — CBC
HCT: 41.3 % (ref 39.0–52.0)
Hemoglobin: 13.9 g/dL (ref 13.0–17.0)
MCH: 31.7 pg (ref 26.0–34.0)
MCHC: 33.7 g/dL (ref 30.0–36.0)
MCV: 94.3 fL (ref 80.0–100.0)
Platelets: 433 10*3/uL — ABNORMAL HIGH (ref 150–400)
RBC: 4.38 MIL/uL (ref 4.22–5.81)
RDW: 12.2 % (ref 11.5–15.5)
WBC: 11.5 10*3/uL — ABNORMAL HIGH (ref 4.0–10.5)
nRBC: 0 % (ref 0.0–0.2)

## 2021-07-04 LAB — RESP PANEL BY RT-PCR (FLU A&B, COVID) ARPGX2
Influenza A by PCR: NEGATIVE
Influenza B by PCR: NEGATIVE
SARS Coronavirus 2 by RT PCR: NEGATIVE

## 2021-07-04 LAB — TROPONIN I (HIGH SENSITIVITY)
Troponin I (High Sensitivity): 2 ng/L (ref <18)
Troponin I (High Sensitivity): 2 ng/L (ref <18)

## 2021-07-04 LAB — LIPASE, BLOOD: Lipase: 21 U/L (ref 11–51)

## 2021-07-04 LAB — D-DIMER, QUANTITATIVE: D-Dimer, Quant: 1.35 ug/mL-FEU — ABNORMAL HIGH (ref 0.00–0.50)

## 2021-07-04 MED ORDER — AZITHROMYCIN 250 MG PO TABS
500.0000 mg | ORAL_TABLET | Freq: Once | ORAL | Status: AC
  Administered 2021-07-04: 500 mg via ORAL
  Filled 2021-07-04: qty 2

## 2021-07-04 MED ORDER — AMOXICILLIN-POT CLAVULANATE 875-125 MG PO TABS: 1.0000 | ORAL_TABLET | Freq: Two times a day (BID) | ORAL | 0 refills | Status: DC

## 2021-07-04 MED ORDER — AZITHROMYCIN 250 MG PO TABS: 250.0000 mg | ORAL_TABLET | Freq: Every day | ORAL | 0 refills | Status: AC

## 2021-07-04 MED ORDER — AMOXICILLIN-POT CLAVULANATE 875-125 MG PO TABS
1.0000 | ORAL_TABLET | Freq: Once | ORAL | Status: AC
  Administered 2021-07-04: 1 via ORAL
  Filled 2021-07-04: qty 1

## 2021-07-04 NOTE — Discharge Instructions (Addendum)
As discussed, you have been diagnosed with pneumonia.  Please take all medication as directed and monitor your condition carefully.  Although you have elected to not have the CT scan performed, do not hesitate to return here if you develop any new, or concerning changes in your condition.  Otherwise follow-up with your physician.

## 2021-07-04 NOTE — Telephone Encounter (Signed)
[name] from [facility] calling in to request appt. Pt is having cough, hurting in chest, congestion, no appetite, feeling weak and having some shortness of breath. Pt was tested for COVID this morning. Glenetta Hew to have patient go to ER or Urgent Care since we are booked today and tomorrow. [name] verbalized understanding.

## 2021-07-04 NOTE — ED Triage Notes (Signed)
Cramping pain for 2-3 months

## 2021-07-04 NOTE — ED Provider Notes (Signed)
[facility] EMERGENCY DEPARTMENT Provider Note   CSN: [id number] Arrival date & time: 07/04/21  1501     History Chief Complaint  Patient presents with   Chest Pain    [name] is a 70 y.o. male.  HPI Adult male with history of smoking, COPD, multiple other medical issues presents with concern for ongoing chest pain, dyspnea, and new hematemesis.  Patient notes symptoms of been present for weeks, possibly months, but until the past day or 2 does not seem as though he had hemoptysis.  His discomfort is bilateral lower thoracic, circumferential, tight, sore, with associated congestion, cough.  No relief with OTC or prescribed medication from his primary care physician. No fever, no syncope, no vomiting, no diarrhea.  There is some nausea.  He notes that he is only smoking 3 to 4 cigarettes daily. admitted for his hemoptysis, nor any clear alleviating or exacerbating factors.    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   Hyperlipidemia     Patient Active Problem List   Diagnosis Date Noted   Decreased breath sounds 04/07/2021   Cough productive of purulent sputum 04/07/2021   Bronchial irritation 04/07/2021   Acute bronchitis with COPD ([place]) 04/07/2021   Acute rhinosinusitis 04/07/2021   Aortic atherosclerosis ([place]) 02/26/2021   Encounter for chronic pain management 05/10/2020   Pneumonia due to COVID-19 virus 12/02/2019   COPD mixed type ([place]) 02/02/2018   GERD (gastroesophageal reflux disease) 08/29/2017   Chronic leg pain 12/15/2015   History of left below knee amputation ([place]) 08/31/2015   Hyperlipidemia 02/18/2014   Chronic back pain 07/10/2013   Insomnia 07/10/2013   Generalized  anxiety disorder 07/10/2013    History reviewed. No pertinent surgical history.     Family History  Problem Relation Age of Onset   Healthy Mother    Healthy Father     Social History   Tobacco Use   Smoking status: Light Smoker    Types: Cigarettes   Smokeless tobacco: Never  Substance Use Topics   Alcohol use: No    Home Medications Prior to Admission medications   Medication Sig Start Date End Date Taking? Authorizing Provider  amoxicillin-clavulanate (AUGMENTIN) 875-125 MG tablet Take 1 tablet by mouth every 12 (twelve) hours. 07/04/21  Yes Carmin Muskrat, MD  azithromycin (ZITHROMAX) 250 MG tablet Take 1 tablet (250 mg total) by mouth daily for 4 days. Take 1 every day until finished. 07/04/21 07/08/21 Yes Carmin Muskrat, MD  ALPRAZolam Duanne Moron) 0.5 MG tablet TAKE 1 TABLET BY MOUTH 2 TIMES A DAY. (1 AT 8AM & 1 AT 2PM.) 06/13/21   [name], MD  budesonide-formoterol (SYMBICORT) 160-4.5 MCG/ACT inhaler Inhale 2 puffs into the lungs 2 (two) times daily. 11/09/20   [name], MD  guaiFENesin-dextromethorphan (ROBITUSSIN DM) 100-10 MG/5ML syrup Take 5 mLs by mouth every 4 (four) hours as needed for cough. 11/11/20   Nilda Simmer, NP  HYDROcodone-acetaminophen (NORCO/VICODIN) 5-325 MG tablet TAKE 1 TABLET BY MOUTH 3 TIMES DAILY AT 8am, 2pm & 8pm. 02/27/21   [name], MD  HYDROcodone-acetaminophen (NORCO/VICODIN) 5-325 MG tablet One at 8am,2pm,8pm 06/13/21   Luking,  Scott A, MD  HYDROcodone-acetaminophen (NORCO/VICODIN) 5-325 MG tablet TAKE 1 TABLET BY MOUTH 3 TIMES DAILY AT 8am, 2pm & 8pm. 06/13/21   Luking, Elayne Snare, MD  hyoscyamine (LEVSIN SL) 0.125 MG SL tablet PLACE 1 TABLET UNDER THE TONGUE EVERY 6 HOURS AS NEEDED FOR CRAMPS 09/18/20   [name], MD  nortriptyline (PAMELOR) 50 MG capsule Take 2 capsules (100 mg total) by mouth at bedtime. 06/13/21   [name], MD  omeprazole (PRILOSEC) 40 MG capsule Take 1 capsule (40 mg total) by mouth daily.  06/13/21   [name], MD  PAIN RELIEF EXTRA STRENGTH 500 MG tablet TAKE 1 TABLET BY MOUTH EVERY 6 HOURS AS NEEDED FOR PAIN OR HEADACHE. DO NOT EXCEED 3 DOSES PER DAY. 05/13/21   [name], MD  temazepam (RESTORIL) 15 MG capsule Take 1 capsule (15 mg total) by mouth at bedtime. 06/13/21   [name], MD  VENTOLIN HFA 108 (90 Base) MCG/ACT inhaler INHALE 2 PUFFS INTO THE LUNGS EVERY 4 HOURS AS NEEDED FOR WHEEZING. 04/07/20   [name], MD    Allergies    Asa [aspirin] and Benadryl [diphenhydramine hcl]  Review of Systems   Review of Systems  Constitutional:        Per HPI, otherwise negative  HENT:         Per HPI, otherwise negative  Respiratory:         Per HPI, otherwise negative  Cardiovascular:        Per HPI, otherwise negative  Gastrointestinal:  Negative for vomiting.  Endocrine:       Negative aside from HPI  Genitourinary:        Neg aside from HPI   Musculoskeletal:        Per HPI, otherwise negative  Skin: Negative.   Neurological:  Negative for syncope.   Physical Exam Updated Vital Signs BP 135/78 (BP Location: Left Arm)   Pulse 98   Temp 98.4 F (36.9 C) (Oral)   Resp 16   Ht 6' (1.829 m)   Wt 75.8 kg   SpO2 93%   BMI 22.65 kg/m   Physical Exam Vitals and nursing note reviewed.  Constitutional:      General: He is not in acute distress.    Appearance: He is well-developed.  HENT:     Head: Normocephalic and atraumatic.  Eyes:     Conjunctiva/sclera: Conjunctivae normal.  Cardiovascular:     Rate and Rhythm: Normal rate and regular rhythm.  Pulmonary:     Effort: Pulmonary effort is normal. Tachypnea present. No respiratory distress.     Breath sounds: No stridor.  Abdominal:     General: There is no distension.  Skin:    General: Skin is warm and dry.  Neurological:     Mental Status: He is alert and oriented to person, place, and time.    ED Results / Procedures / Treatments   Labs (all labs ordered are listed, but only  abnormal results are displayed) Labs Reviewed  BASIC METABOLIC PANEL - Abnormal; Notable for the following components:      Result Value   Chloride 97 (*)    Glucose, Bld 126 (*)    All other components within normal limits  CBC - Abnormal; Notable for the following components:   WBC 11.5 (*)    Platelets 433 (*)    All other components within normal limits  D-DIMER, QUANTITATIVE - Abnormal; Notable for the following  components:   D-Dimer, Quant 1.35 (*)    All other components within normal limits  HEPATIC FUNCTION PANEL - Abnormal; Notable for the following components:   Albumin 3.3 (*)    All other components within normal limits  RESP PANEL BY RT-PCR (FLU A&B, COVID) ARPGX2  LIPASE, BLOOD  TROPONIN I (HIGH SENSITIVITY)  TROPONIN I (HIGH SENSITIVITY)    EKG EKG Interpretation  Date/Time:  Tuesday July 04 2021 15:13:08 EDT Ventricular Rate:  108 PR Interval:  124 QRS Duration: 88 QT Interval:  334 QTC Calculation: 447 R Axis:   74 Text Interpretation: Sinus tachycardia Artifact ST-t wave abnormality Abnormal ECG Confirmed by Carmin Muskrat [phone]) on 07/04/2021 3:36:04 PM  Radiology DG Chest 2 View  Result Date: 07/04/2021 CLINICAL DATA:  Chest pain, cough EXAM: CHEST - 2 VIEW COMPARISON:  04/07/2021 FINDINGS: Heart is normal size. Increasing right lower lobe airspace opacity concerning for pneumonia. Left lung clear. No effusions or acute bony abnormality. Mild compression fracture in the lower thoracic spine, stable. IMPRESSION: Right lower lobe airspace opacity concerning for pneumonia. Electronically Signed   By: Rolm Baptise M.D.   On: 07/04/2021 16:06    Procedures Procedures   Medications Ordered in ED Medications  azithromycin (ZITHROMAX) tablet 500 mg (has no administration in time range)  amoxicillin-clavulanate (AUGMENTIN) 875-125 MG per tablet 1 tablet (has no administration in time range)    ED Course  I have reviewed the triage vital signs and the  nursing notes.  Pertinent labs & imaging results that were available during my care of the patient were reviewed by me and considered in my medical decision making (see chart for details).  Cardiac 100s sinus tach abnormal Pulse ox 90% room air abnormal   7:35 PM On repeat exam patient awake, alert, sitting upright.  No increased work of breathing, oxygen 93% on room air.  We discussed all findings, including indication for CT scan, but the patient declines this.  States he is comfortable with initiating antibiotics for pneumonia, as demonstrated on x-ray, which I reviewed.  Labs reassuring aside from slight elevation in D-dimer, which is consistent for the patient, but as above, CT angio to rule out PE was indicated, though deferred by the patient. Patient understands importance of taking antibiotics, following up with physician, return here for concerning changes. MDM Rules/Calculators/A&P MDM Number of Diagnoses or Management Options Community acquired pneumonia of right lower lobe of lung: new, needed workup   Amount and/or Complexity of Data Reviewed Clinical lab tests: ordered and reviewed Tests in the radiology section of CPT: ordered and reviewed Tests in the medicine section of CPT: reviewed and ordered Decide to obtain previous medical records or to obtain history from someone other than the patient: yes Review and summarize past medical records: yes Independent visualization of images, tracings, or specimens: yes  Risk of Complications, Morbidity, and/or Mortality Presenting problems: high Diagnostic procedures: high Management options: high  Critical Care Total time providing critical care: < 30 minutes  Patient Progress Patient progress: stable   Final Clinical Impression(s) / ED Diagnoses Final diagnoses:  Community acquired pneumonia of right lower lobe of lung    Rx / DC Orders ED Discharge Orders          Ordered    azithromycin (ZITHROMAX) 250 MG tablet   Daily        07/04/21 1935    amoxicillin-clavulanate (AUGMENTIN) 875-125 MG tablet  Every 12 hours        07/04/21 1935  Carmin Muskrat, MD 07/04/21 [phone]

## 2021-07-04 NOTE — ED Triage Notes (Signed)
Also states he has been coughing up blood

## 2021-07-05 NOTE — Telephone Encounter (Signed)
Patient was seen in the ER, ER diagnosis pneumonia, needs follow-up in 7 to 10 days Please arrange follow-up toward the end of next week thank you

## 2021-07-05 NOTE — Telephone Encounter (Signed)
Please schedule patient for follow up toward the end of next week. Thank you!

## 2021-07-07 ENCOUNTER — Other Ambulatory Visit: Payer: Self-pay | Admitting: Family Medicine

## 2021-07-11 DIAGNOSIS — U071 COVID-19: Secondary | ICD-10-CM | POA: Diagnosis not present

## 2021-07-11 DIAGNOSIS — Z20828 Contact with and (suspected) exposure to other viral communicable diseases: Secondary | ICD-10-CM | POA: Diagnosis not present

## 2021-07-13 ENCOUNTER — Other Ambulatory Visit: Payer: Self-pay

## 2021-07-13 ENCOUNTER — Encounter: Payer: Self-pay | Admitting: Family Medicine

## 2021-07-13 ENCOUNTER — Ambulatory Visit (INDEPENDENT_AMBULATORY_CARE_PROVIDER_SITE_OTHER): Payer: Medicare Other | Admitting: Family Medicine

## 2021-07-13 VITALS — BP 128/83 | HR 114 | Temp 96.4°F | Wt 166.6 lb

## 2021-07-13 DIAGNOSIS — D692 Other nonthrombocytopenic purpura: Secondary | ICD-10-CM | POA: Diagnosis not present

## 2021-07-13 DIAGNOSIS — R634 Abnormal weight loss: Secondary | ICD-10-CM

## 2021-07-13 DIAGNOSIS — J189 Pneumonia, unspecified organism: Secondary | ICD-10-CM | POA: Diagnosis not present

## 2021-07-13 DIAGNOSIS — M6281 Muscle weakness (generalized): Secondary | ICD-10-CM

## 2021-07-13 DIAGNOSIS — R5383 Other fatigue: Secondary | ICD-10-CM | POA: Diagnosis not present

## 2021-07-13 MED ORDER — AZITHROMYCIN 250 MG PO TABS: ORAL_TABLET | ORAL | 0 refills | Status: DC

## 2021-07-13 MED ORDER — AZITHROMYCIN 250 MG PO TABS: ORAL_TABLET | ORAL | 0 refills | Status: AC

## 2021-07-13 NOTE — Progress Notes (Signed)
   Subjective:    Patient ID: [name], male    DOB: 04/26/1951, 70 y.o.   MRN: [id number]  HPI Pt went to ER on 07/04/21 with dx of pneumonia in right lung. Pt states he has no energy, decreased appetite and the only pleasure he gets is going to bed at night. 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  Patient relates a lot of fatigue tiredness not eating well.  Losing weight.  This been going on for several weeks recently diagnosed with pneumonia finishing up antibiotic  Review of Systems     Objective:   Physical Exam  General-in no acute distress Eyes-no discharge Lungs-respiratory rate normal, CTA CV-no murmurs,RRR Abd soft No jaundice Neuro grossly normal Behavior normal, alert Has bruising on his arms      Assessment & Plan:  1. Community acquired pneumonia of right lower lobe of lung We will do a follow-up chest x-ray next week if he continues to lose weight may need to do CT scan abdomen as well as chest - DG Chest 2 View - CBC with Differential - Basic Metabolic Panel (BMET) - Sed Rate (ESR) - C-reactive protein - Hepatic function panel  2. Weight loss Weight loss-it could be a manifestation of stress but we also need to make sure there is not underlying illness triggering this - DG Chest 2 View - CBC with Differential - Basic Metabolic Panel (BMET) - Sed Rate (ESR) - C-reactive protein - Hepatic function panel  3. Other fatigue Significant fatigue related to his recent illness hopefully this will build up over the next few weeks and get his energy back he will recheck in approximately 10 to 14 days - DG Chest 2 View - CBC with Differential - Basic Metabolic Panel (BMET) - Sed Rate (ESR) - C-reactive protein - Hepatic function panel  4. Muscle weakness Check inflammatory markers I doubt polymyalgia rheumatica but that is a possibility - DG Chest 2 View - CBC with Differential - Basic Metabolic Panel (BMET) - Sed Rate (ESR) -  C-reactive protein - Hepatic function panel  5. Senile purpura (HCC) Bruising check CBC - DG Chest 2 View - CBC with Differential - Basic Metabolic Panel (BMET) - Sed Rate (ESR) - C-reactive protein - Hepatic function panel

## 2021-07-14 ENCOUNTER — Other Ambulatory Visit: Payer: Self-pay | Admitting: Family Medicine

## 2021-07-14 LAB — C-REACTIVE PROTEIN: CRP: 78 mg/L — ABNORMAL HIGH (ref 0–10)

## 2021-07-14 LAB — HEPATIC FUNCTION PANEL
ALT: 8 IU/L (ref 0–44)
AST: 24 IU/L (ref 0–40)
Albumin: 4.1 g/dL (ref 3.8–4.8)
Alkaline Phosphatase: 117 IU/L (ref 44–121)
Bilirubin Total: 0.3 mg/dL (ref 0.0–1.2)
Bilirubin, Direct: <0.1 mg/dL (ref 0.00–0.40)
Total Protein: 7.9 g/dL (ref 6.0–8.5)

## 2021-07-14 LAB — SEDIMENTATION RATE: Sed Rate: 81 mm/hr — ABNORMAL HIGH (ref 0–30)

## 2021-07-14 LAB — CBC WITH DIFFERENTIAL/PLATELET
Basophils Absolute: 0 10*3/uL (ref 0.0–0.2)
Basos: 0 %
EOS (ABSOLUTE): 0.2 10*3/uL (ref 0.0–0.4)
Eos: 2 %
Hematocrit: 45.1 % (ref 37.5–51.0)
Hemoglobin: 15.6 g/dL (ref 13.0–17.7)
Immature Grans (Abs): 0.1 10*3/uL (ref 0.0–0.1)
Immature Granulocytes: 1 %
Lymphocytes Absolute: 2 10*3/uL (ref 0.7–3.1)
Lymphs: 20 %
MCH: 31.5 pg (ref 26.6–33.0)
MCHC: 34.6 g/dL (ref 31.5–35.7)
MCV: 91 fL (ref 79–97)
Monocytes Absolute: 0.8 10*3/uL (ref 0.1–0.9)
Monocytes: 8 %
Neutrophils Absolute: 6.9 10*3/uL (ref 1.4–7.0)
Neutrophils: 69 %
Platelets: 378 10*3/uL (ref 150–450)
RBC: 4.95 x10E6/uL (ref 4.14–5.80)
RDW: 11.8 % (ref 11.6–15.4)
WBC: 10 10*3/uL (ref 3.4–10.8)

## 2021-07-14 LAB — BASIC METABOLIC PANEL
BUN/Creatinine Ratio: 14 (ref 10–24)
BUN: 11 mg/dL (ref 8–27)
CO2: 26 mmol/L (ref 20–29)
Calcium: 9.8 mg/dL (ref 8.6–10.2)
Chloride: 94 mmol/L — ABNORMAL LOW (ref 96–106)
Creatinine, Ser: 0.8 mg/dL (ref 0.76–1.27)
Glucose: 91 mg/dL (ref 65–99)
Potassium: 4.8 mmol/L (ref 3.5–5.2)
Sodium: 137 mmol/L (ref 134–144)
eGFR: 97 mL/min/{1.73_m2} (ref >59)

## 2021-07-14 MED ORDER — PREDNISONE 5 MG PO TABS: ORAL_TABLET | ORAL | 0 refills | Status: DC

## 2021-07-18 DIAGNOSIS — U071 COVID-19: Secondary | ICD-10-CM | POA: Diagnosis not present

## 2021-07-18 DIAGNOSIS — Z20828 Contact with and (suspected) exposure to other viral communicable diseases: Secondary | ICD-10-CM | POA: Diagnosis not present

## 2021-07-18 DIAGNOSIS — B351 Tinea unguium: Secondary | ICD-10-CM | POA: Diagnosis not present

## 2021-07-18 DIAGNOSIS — M79675 Pain in left toe(s): Secondary | ICD-10-CM | POA: Diagnosis not present

## 2021-07-20 ENCOUNTER — Telehealth: Payer: Self-pay | Admitting: Family Medicine

## 2021-07-20 NOTE — Telephone Encounter (Signed)
Patient is refusing to get scan and [place] is what you to talk with patient to get him to do it. [phone]

## 2021-07-21 ENCOUNTER — Emergency Department (HOSPITAL_COMMUNITY)
Admission: EM | Admit: 2021-07-21 | Discharge: 2021-07-21 | Disposition: A | Discharge status: Home / Self Care | Payer: Medicare Other | Attending: Emergency Medicine | Admitting: Emergency Medicine

## 2021-07-21 ENCOUNTER — Emergency Department (HOSPITAL_COMMUNITY): Payer: Medicare Other

## 2021-07-21 ENCOUNTER — Encounter (HOSPITAL_COMMUNITY): Payer: Self-pay | Admitting: *Deleted

## 2021-07-21 DIAGNOSIS — Z8616 Personal history of COVID-19: Secondary | ICD-10-CM | POA: Insufficient documentation

## 2021-07-21 DIAGNOSIS — C3492 Malignant neoplasm of unspecified part of left bronchus or lung: Secondary | ICD-10-CM | POA: Diagnosis not present

## 2021-07-21 DIAGNOSIS — F17210 Nicotine dependence, cigarettes, uncomplicated: Secondary | ICD-10-CM | POA: Diagnosis not present

## 2021-07-21 DIAGNOSIS — J439 Emphysema, unspecified: Secondary | ICD-10-CM | POA: Diagnosis not present

## 2021-07-21 DIAGNOSIS — R1032 Left lower quadrant pain: Secondary | ICD-10-CM | POA: Diagnosis not present

## 2021-07-21 DIAGNOSIS — R000 Tachycardia, unspecified: Secondary | ICD-10-CM | POA: Diagnosis not present

## 2021-07-21 DIAGNOSIS — K8020 Calculus of gallbladder without cholecystitis without obstruction: Secondary | ICD-10-CM | POA: Diagnosis not present

## 2021-07-21 DIAGNOSIS — C3491 Malignant neoplasm of unspecified part of right bronchus or lung: Secondary | ICD-10-CM

## 2021-07-21 DIAGNOSIS — Z7951 Long term (current) use of inhaled steroids: Secondary | ICD-10-CM | POA: Diagnosis not present

## 2021-07-21 DIAGNOSIS — R630 Anorexia: Secondary | ICD-10-CM | POA: Diagnosis not present

## 2021-07-21 DIAGNOSIS — R634 Abnormal weight loss: Secondary | ICD-10-CM | POA: Diagnosis not present

## 2021-07-21 DIAGNOSIS — R079 Chest pain, unspecified: Secondary | ICD-10-CM | POA: Diagnosis not present

## 2021-07-21 DIAGNOSIS — J449 Chronic obstructive pulmonary disease, unspecified: Secondary | ICD-10-CM | POA: Insufficient documentation

## 2021-07-21 DIAGNOSIS — R52 Pain, unspecified: Secondary | ICD-10-CM | POA: Diagnosis not present

## 2021-07-21 DIAGNOSIS — M549 Dorsalgia, unspecified: Secondary | ICD-10-CM | POA: Diagnosis not present

## 2021-07-21 DIAGNOSIS — K449 Diaphragmatic hernia without obstruction or gangrene: Secondary | ICD-10-CM | POA: Diagnosis not present

## 2021-07-21 DIAGNOSIS — I700 Atherosclerosis of aorta: Secondary | ICD-10-CM | POA: Diagnosis not present

## 2021-07-21 LAB — COMPREHENSIVE METABOLIC PANEL
ALT: 13 U/L (ref 0–44)
AST: 23 U/L (ref 15–41)
Albumin: 3.8 g/dL (ref 3.5–5.0)
Alkaline Phosphatase: 107 U/L (ref 38–126)
Anion gap: 8 (ref 5–15)
BUN: 10 mg/dL (ref 8–23)
CO2: 29 mmol/L (ref 22–32)
Calcium: 9.3 mg/dL (ref 8.9–10.3)
Chloride: 99 mmol/L (ref 98–111)
Creatinine, Ser: 0.81 mg/dL (ref 0.61–1.24)
GFR, Estimated: >60 mL/min (ref >60)
Glucose, Bld: 107 mg/dL — ABNORMAL HIGH (ref 70–99)
Potassium: 4.4 mmol/L (ref 3.5–5.1)
Sodium: 136 mmol/L (ref 135–145)
Total Bilirubin: 0.5 mg/dL (ref 0.3–1.2)
Total Protein: 8.6 g/dL — ABNORMAL HIGH (ref 6.5–8.1)

## 2021-07-21 LAB — CBC WITH DIFFERENTIAL/PLATELET
Abs Immature Granulocytes: 0.12 10*3/uL — ABNORMAL HIGH (ref 0.00–0.07)
Basophils Absolute: 0 10*3/uL (ref 0.0–0.1)
Basophils Relative: 0 %
Eosinophils Absolute: 0 10*3/uL (ref 0.0–0.5)
Eosinophils Relative: 0 %
HCT: 46.3 % (ref 39.0–52.0)
Hemoglobin: 15.6 g/dL (ref 13.0–17.0)
Immature Granulocytes: 1 %
Lymphocytes Relative: 16 %
Lymphs Abs: 1.7 10*3/uL (ref 0.7–4.0)
MCH: 31.4 pg (ref 26.0–34.0)
MCHC: 33.7 g/dL (ref 30.0–36.0)
MCV: 93.2 fL (ref 80.0–100.0)
Monocytes Absolute: 1 10*3/uL (ref 0.1–1.0)
Monocytes Relative: 9 %
Neutro Abs: 7.7 10*3/uL (ref 1.7–7.7)
Neutrophils Relative %: 74 %
Platelets: 365 10*3/uL (ref 150–400)
RBC: 4.97 MIL/uL (ref 4.22–5.81)
RDW: 12.7 % (ref 11.5–15.5)
WBC: 10.5 10*3/uL (ref 4.0–10.5)
nRBC: 0 % (ref 0.0–0.2)

## 2021-07-21 MED ORDER — IOHEXOL 350 MG/ML SOLN
85.0000 mL | Freq: Once | INTRAVENOUS | Status: AC | PRN
  Administered 2021-07-21: 85 mL via INTRAVENOUS

## 2021-07-21 MED ORDER — SODIUM CHLORIDE 0.9 % IV SOLN: INTRAVENOUS | Status: DC

## 2021-07-21 NOTE — Telephone Encounter (Signed)
As in he is refusing the chest x-ray? He has an appointment with me on 822 at 1:30 PM we can always discuss further at that point

## 2021-07-21 NOTE — ED Provider Notes (Signed)
[facility] EMERGENCY DEPARTMENT Provider Note   CSN: [id number] Arrival date & time: 07/21/21  1546     History Chief Complaint  Patient presents with   Flank Pain    [name] is a 70 y.o. male.  HPI He presents for evaluation of pain in his left side, near the pelvic room, on and off for several days.  No known trauma.  He states the pain in this area is worse with ambulation.  He is recovering from a pneumonia, diagnosed and treated earlier this month.  He sees his doctor regularly.  He has been losing some weight over the last couple months.  He was due to have a follow-up chest x-ray to make sure the pneumonia was improving but has not been done yet.  He denies cough, shortness of breath, fever, chills, nausea or vomiting.  He has not noticed a change in bowels or urine.  There are no other known modifying factors.    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   Hyperlipidemia     Patient Active Problem List   Diagnosis Date Noted   Decreased breath sounds 04/07/2021   Cough productive of purulent sputum 04/07/2021   Bronchial irritation 04/07/2021   Acute bronchitis with COPD ([place]) 04/07/2021   Acute rhinosinusitis 04/07/2021   Aortic atherosclerosis ([place]) 02/26/2021   Encounter for chronic pain management 05/10/2020   Pneumonia due to COVID-19 virus 12/02/2019   COPD mixed type ([place]) 02/02/2018   GERD (gastroesophageal reflux disease) 08/29/2017   Chronic leg pain 12/15/2015   History of left below knee amputation ([place]) 08/31/2015   Hyperlipidemia 02/18/2014   Chronic back pain 07/10/2013   Insomnia 07/10/2013   Generalized anxiety disorder 07/10/2013    History reviewed. No  pertinent surgical history.     Family History  Problem Relation Age of Onset   Healthy Mother    Healthy Father     Social History   Tobacco Use   Smoking status: Light Smoker    Types: Cigarettes   Smokeless tobacco: Never  Substance Use Topics   Alcohol use: No    Home Medications Prior to Admission medications   Medication Sig Start Date End Date Taking? Authorizing Provider  ALPRAZolam (XANAX) 0.5 MG tablet TAKE 1 TABLET BY MOUTH 2 TIMES A DAY. (1 AT 8AM & 1 AT 2PM.) 06/13/21   [name], MD  budesonide-formoterol (SYMBICORT) 160-4.5 MCG/ACT inhaler Inhale 2 puffs into the lungs 2 (two) times daily. 11/09/20   [name], MD  guaiFENesin-dextromethorphan (ROBITUSSIN DM) 100-10 MG/5ML syrup Take 5 mLs by mouth every 4 (four) hours as needed for cough. 11/11/20   Nilda Simmer, NP  HYDROcodone-acetaminophen (NORCO/VICODIN) 5-325 MG tablet TAKE 1 TABLET BY MOUTH 3 TIMES DAILY AT 8am, 2pm & 8pm. 02/27/21   [name], MD  HYDROcodone-acetaminophen (NORCO/VICODIN) 5-325 MG tablet One at 8am,2pm,8pm 06/13/21   Luking, Elayne Snare, MD  HYDROcodone-acetaminophen (NORCO/VICODIN) 5-325 MG tablet TAKE 1 TABLET BY MOUTH 3 TIMES DAILY AT 8am, 2pm & 8pm. 06/13/21   [name], MD  hyoscyamine (LEVSIN SL) 0.125 MG SL tablet PLACE 1 TABLET UNDER THE  TONGUE EVERY 6 HOURS AS NEEDED FOR CRAMPS 09/18/20   [name], MD  nortriptyline (PAMELOR) 50 MG capsule Take 2 capsules (100 mg total) by mouth at bedtime. 06/13/21   [name], MD  omeprazole (PRILOSEC) 40 MG capsule Take 1 capsule (40 mg total) by mouth daily. 06/13/21   [name], MD  PAIN RELIEF EXTRA STRENGTH 500 MG tablet TAKE 1 TABLET BY MOUTH EVERY 6 HOURS AS NEEDED FOR PAIN OR HEADACHE. DO NOT EXCEED 3 DOSES PER DAY. 07/10/21   [name], MD  predniSONE (DELTASONE) 5 MG tablet Take 3 tablet po daily 07/14/21   Luking, Elayne Snare, MD  temazepam (RESTORIL) 15 MG capsule Take 1 capsule (15 mg total) by mouth  at bedtime. 06/13/21   [name], MD  VENTOLIN HFA 108 (90 Base) MCG/ACT inhaler INHALE 2 PUFFS INTO THE LUNGS EVERY 4 HOURS AS NEEDED FOR WHEEZING. 04/07/20   [name], MD    Allergies    Asa [aspirin] and Benadryl [diphenhydramine hcl]  Review of Systems   Review of Systems  All other systems reviewed and are negative.  Physical Exam Updated Vital Signs BP 140/80   Pulse 98   Temp 98 F (36.7 C) (Oral)   Resp 18   SpO2 94%   Physical Exam Vitals and nursing note reviewed.  Constitutional:      General: He is not in acute distress.    Appearance: He is well-developed. He is not ill-appearing, toxic-appearing or diaphoretic.  HENT:     Head: Normocephalic and atraumatic.     Right Ear: External ear normal.     Left Ear: External ear normal.  Eyes:     Conjunctiva/sclera: Conjunctivae normal.     Pupils: Pupils are equal, round, and reactive to light.  Neck:     Trachea: Phonation normal.  Cardiovascular:     Rate and Rhythm: Normal rate and regular rhythm.     Heart sounds: Normal heart sounds.  Pulmonary:     Effort: Pulmonary effort is normal.     Breath sounds: Normal breath sounds.  Abdominal:     General: There is no distension.     Palpations: Abdomen is soft.     Tenderness: There is abdominal tenderness (Left lower, at pelvic brim; mild).  Musculoskeletal:        General: No swelling, tenderness, deformity or signs of injury. Normal range of motion.     Cervical back: Normal range of motion and neck supple.  Skin:    General: Skin is warm and dry.  Neurological:     Mental Status: He is alert and oriented to person, place, and time.     Cranial Nerves: No cranial nerve deficit.     Sensory: No sensory deficit.     Motor: No abnormal muscle tone.     Coordination: Coordination normal.  Psychiatric:        Mood and Affect: Mood normal.        Behavior: Behavior normal.        Thought Content: Thought content normal.        Judgment: Judgment  normal.    ED Results / Procedures / Treatments   Labs (all labs ordered are listed, but only abnormal results are displayed) Labs Reviewed  COMPREHENSIVE METABOLIC PANEL - Abnormal; Notable for the following components:      Result Value   Glucose, Bld 107 (*)    Total Protein 8.6 (*)  All other components within normal limits  CBC WITH DIFFERENTIAL/PLATELET - Abnormal; Notable for the following components:   Abs Immature Granulocytes 0.12 (*)    All other components within normal limits    EKG None  Radiology CT Chest W Contrast  Result Date: 07/21/2021 CLINICAL DATA:  Unintended weight loss. Left flank pain radiating to the left hip for 2 days. Coughing up blood for 2 weeks. 8 pound unintentional weight loss in 3 weeks. Loss of appetite. EXAM: CT CHEST, ABDOMEN, AND PELVIS WITH CONTRAST TECHNIQUE: Multidetector CT imaging of the chest, abdomen and pelvis was performed following the standard protocol during bolus administration of intravenous contrast. CONTRAST:  59mL OMNIPAQUE IOHEXOL 350 MG/ML SOLN COMPARISON:  12/02/2019 FINDINGS: CT CHEST FINDINGS Cardiovascular: Normal heart size. No pericardial effusions. Normal caliber thoracic aorta. Scattered aortic calcifications. No dissection. Mediastinum/Nodes: Prominent mediastinal lymphadenopathy, new since prior study. Heterogeneous appearance of the lymph nodes with lower attenuation areas consistent with necrosis. A right paratracheal lymph node measures about 3.6 cm in diameter. Subcarinal lymph node measures about 2.4 cm short axis dimension. Esophagus is decompressed. Small esophageal hiatal hernia. Lungs/Pleura: There is a mass lesion arising in or around the right bronchus intermedius, significantly narrowing the bronchus intermedius, and measuring about 3.3 x 4.1 cm. This is likely primary bronchogenic carcinoma. Less likely consideration would be extrinsic compression due to lymphadenopathy, either metastatic or lymphoma.  Additional enlarged right hilar lymph nodes are present, measuring up to 2.2 cm diameter. The mass and hilar lymphadenopathy are new since the previous study. Mild patchy coarse infiltrative areas demonstrated in the right middle lobe and right lower lobe probably represent early postobstructive change. Chronic fibrosis related to previous COVID-19 could also have this appearance. Diffuse emphysematous changes in the lungs. No pleural effusions. No pneumothorax. Musculoskeletal: Mild anterior compression of T11 without a destructive lesions apparent. No focal bone lesion or bone destruction. Mild degenerative changes in the spine. CT ABDOMEN PELVIS FINDINGS Hepatobiliary: No focal liver lesions. Portal veins are patent. Several small stones in the gallbladder. No gallbladder wall thickening or inflammation. No bile duct dilatation. Pancreas: Unremarkable. No pancreatic ductal dilatation or surrounding inflammatory changes. Spleen: Normal in size without focal abnormality. Adrenals/Urinary Tract: Adrenal glands are unremarkable. Kidneys are normal, without renal calculi, focal lesion, or hydronephrosis. Bladder is unremarkable. Stomach/Bowel: Stomach is within normal limits. Appendix appears normal. No evidence of bowel wall thickening, distention, or inflammatory changes. Vascular/Lymphatic: Scattered retroperitoneal lymph nodes are not pathologically enlarged. Nodular prominence of the crus of the diaphragm is unchanged since prior study, likely normal variation. Calcification of the aorta. No aneurysm. Reproductive: Prostate is unremarkable. Other: No free air or free fluid in the abdomen. Abdominal wall musculature appears intact. Musculoskeletal: Mild inferior endplate compression of L1. Degenerative changes most prominent at L5-S1. Degenerative changes in the hips. Productive changes and cortical irregularities in the iliac bones bilaterally likely representing postoperative change. No definite bone metastasis.  Old fracture deformities of the pubic rami. IMPRESSION: 1. 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 Patchy postobstructive changes in the right middle lobe and right lower lung. 2. No evidence of distant metastatic disease in the abdomen or pelvis. 3. Diffuse emphysematous changes in the lungs. 4. Cholelithiasis. 5. Aortic atherosclerosis. Electronically Signed   By: Lucienne Capers M.D.   On: 07/21/2021 19:18   CT Abdomen Pelvis W Contrast  Result Date: 07/21/2021 CLINICAL DATA:  Unintended weight  loss. Left flank pain radiating to the left hip for 2 days. Coughing up blood for 2 weeks. 8 pound unintentional weight loss in 3 weeks. Loss of appetite. EXAM: CT CHEST, ABDOMEN, AND PELVIS WITH CONTRAST TECHNIQUE: Multidetector CT imaging of the chest, abdomen and pelvis was performed following the standard protocol during bolus administration of intravenous contrast. CONTRAST:  66mL OMNIPAQUE IOHEXOL 350 MG/ML SOLN COMPARISON:  12/02/2019 FINDINGS: CT CHEST FINDINGS Cardiovascular: Normal heart size. No pericardial effusions. Normal caliber thoracic aorta. Scattered aortic calcifications. No dissection. Mediastinum/Nodes: Prominent mediastinal lymphadenopathy, new since prior study. Heterogeneous appearance of the lymph nodes with lower attenuation areas consistent with necrosis. A right paratracheal lymph node measures about 3.6 cm in diameter. Subcarinal lymph node measures about 2.4 cm short axis dimension. Esophagus is decompressed. Small esophageal hiatal hernia. Lungs/Pleura: There is a mass lesion arising in or around the right bronchus intermedius, significantly narrowing the bronchus intermedius, and measuring about 3.3 x 4.1 cm. This is likely primary bronchogenic carcinoma. Less likely consideration would be extrinsic compression due to lymphadenopathy,  either metastatic or lymphoma. Additional enlarged right hilar lymph nodes are present, measuring up to 2.2 cm diameter. The mass and hilar lymphadenopathy are new since the previous study. Mild patchy coarse infiltrative areas demonstrated in the right middle lobe and right lower lobe probably represent early postobstructive change. Chronic fibrosis related to previous COVID-19 could also have this appearance. Diffuse emphysematous changes in the lungs. No pleural effusions. No pneumothorax. Musculoskeletal: Mild anterior compression of T11 without a destructive lesions apparent. No focal bone lesion or bone destruction. Mild degenerative changes in the spine. CT ABDOMEN PELVIS FINDINGS Hepatobiliary: No focal liver lesions. Portal veins are patent. Several small stones in the gallbladder. No gallbladder wall thickening or inflammation. No bile duct dilatation. Pancreas: Unremarkable. No pancreatic ductal dilatation or surrounding inflammatory changes. Spleen: Normal in size without focal abnormality. Adrenals/Urinary Tract: Adrenal glands are unremarkable. Kidneys are normal, without renal calculi, focal lesion, or hydronephrosis. Bladder is unremarkable. Stomach/Bowel: Stomach is within normal limits. Appendix appears normal. No evidence of bowel wall thickening, distention, or inflammatory changes. Vascular/Lymphatic: Scattered retroperitoneal lymph nodes are not pathologically enlarged. Nodular prominence of the crus of the diaphragm is unchanged since prior study, likely normal variation. Calcification of the aorta. No aneurysm. Reproductive: Prostate is unremarkable. Other: No free air or free fluid in the abdomen. Abdominal wall musculature appears intact. Musculoskeletal: Mild inferior endplate compression of L1. Degenerative changes most prominent at L5-S1. Degenerative changes in the hips. Productive changes and cortical irregularities in the iliac bones bilaterally likely representing postoperative  change. No definite bone metastasis. Old fracture deformities of the pubic rami. IMPRESSION: 1. 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 Patchy postobstructive changes in the right middle lobe and right lower lung. 2. No evidence of distant metastatic disease in the abdomen or pelvis. 3. Diffuse emphysematous changes in the lungs. 4. Cholelithiasis. 5. Aortic atherosclerosis. Electronically Signed   By: Lucienne Capers M.D.   On: 07/21/2021 19:18    Procedures .Critical Care  Date/Time: 07/21/2021 8:43 PM Performed by: Daleen Bo, MD Authorized by: Daleen Bo, MD   Critical care provider statement:    Critical care time (minutes):  35   Critical care start time:  07/21/2021 4:35 PM   Critical care end time:  07/21/2021 8:43 PM   Critical care time was exclusive of:  Separately billable procedures and  treating other patients   Critical care was necessary to treat or prevent imminent or life-threatening deterioration of the following conditions: Weight loss.   Critical care was time spent personally by me on the following activities:  Blood draw for specimens, development of treatment plan with patient or surrogate, discussions with consultants, evaluation of patient's response to treatment, examination of patient, obtaining history from patient or surrogate, ordering and performing treatments and interventions, ordering and review of laboratory studies, pulse oximetry, re-evaluation of patient's condition, review of old charts and ordering and review of radiographic studies   Medications Ordered in ED Medications  0.9 %  sodium chloride infusion ( Intravenous Stopped 07/21/21 1945)  iohexol (OMNIPAQUE) 350 MG/ML injection 85 mL (85 mLs Intravenous Contrast Given 07/21/21 1816)    ED Course  I have reviewed the triage vital signs and the nursing  notes.  Pertinent labs & imaging results that were available during my care of the patient were reviewed by me and considered in my medical decision making (see chart for details).  Clinical Course as of 07/21/21 2041  Fri Jul 21, 2021  1955 I was getting ready to talk to the patient regarding his abnormal CT, when he left the department without telling anyone.  His nurse is going to call his assisted living facility so that he can be informed of the finding for lung cancer on the chest CT. [EW]  2037 Patient has now returned to the room, with an attendant from his group home, for disposition.  I informed him of probable lung cancer leading to symptoms of weight loss and likely the pneumonia.  At this time he does not appear to have a pneumonia.  He understands he needs to follow-up with pulmonology for diagnosis, and likely oncology for treatment.  I also encouraged him to follow-up with his PCP for further assistance. [EW]    Clinical Course User Index [EW] Daleen Bo, MD   MDM Rules/Calculators/A&P                            Patient Vitals for the past 24 hrs:  BP Temp Temp src Pulse Resp SpO2  07/21/21 1900 140/80 -- -- 98 18 94 %  07/21/21 1800 102/63 -- -- 99 19 92 %  07/21/21 1730 115/79 -- -- (!) 103 -- 94 %  07/21/21 1700 121/78 -- -- (!) 107 18 94 %  07/21/21 1638 121/85 -- -- (!) 112 -- 95 %  07/21/21 1555 (!) 121/55 98 F (36.7 C) Oral (!) 118 18 91 %    8:38 PM Reevaluation with update and discussion. After initial assessment and treatment, an updated evaluation reveals no change in symptomatology.  Patient understands necessity for follow-up with pulmonary, oncology, PCP.  Questions answered.  An aide from his facility is with him. Daleen Bo   Medical Decision Making:  This patient is presenting for evaluation of pain left lower abdomen/pelvis, which does require a range of treatment options, and is a complaint that involves a moderate risk of morbidity and  mortality. The differential diagnoses include: Disorder, UTI, complications of chronic illness. I decided to review old records, and in summary he has an elderly male with recent weight loss, being followed by his PCP after having pneumonia diagnosis and treatment with Augmentin, 2 and half weeks ago.  He does not have a known history of cancer.  He had right leg amputation following traumatic injury, many years ago. I  did not require additional historical information from anyone.  Clinical Laboratory Tests Ordered, included CBC and Metabolic panel. Review indicates essentially normal. Radiologic Tests Ordered, included CT chest, CT abdomen pelvis.  I independently Visualized: Radiograph images, which show likely right-sided bronchogenic carcinoma.  Likely compression fracture, T11.  No evidence for metastases   Critical Interventions-clinical evaluation, laboratory testing, radiography, observation and reassessment  After These Interventions, the Patient was reevaluated and was found with symptomatic right-sided lung cancer.  Doubt active pneumonia.  No evidence for metastases at this time.  This would be a new diagnosis of lung cancer.  He is referred to pulmonology, for diagnosis, PCP for assistance with care, and recommended follow-up with oncology  CRITICAL CARE-yes Performed by: Daleen Bo  Nursing Notes Reviewed/ Care Coordinated Applicable Imaging Reviewed Interpretation of Laboratory Data incorporated into ED treatment  The patient appears reasonably screened and/or stabilized for discharge and I doubt any other medical condition or other [facility] requiring further screening, evaluation, or treatment in the ED at this time prior to discharge.  Plan: Home Medications-continue usual; Home Treatments-rest, fluid; return here if the recommended treatment, does not improve the symptoms; Recommended follow up-pulmonary, oncology, PCP     Final Clinical Impression(s) / ED Diagnoses Final  diagnoses:  Malignant neoplasm of right lung, unspecified part of lung [facility])    Rx / DC Orders ED Discharge Orders     None        Daleen Bo, MD 07/21/21 2044

## 2021-07-21 NOTE — ED Notes (Signed)
Pt left emergency department without notifying staff, Dr. Eulis Foster made aware.

## 2021-07-21 NOTE — ED Triage Notes (Signed)
Sent for evaluation by [facility]. Pain in left flank

## 2021-07-21 NOTE — ED Notes (Signed)
Pt caregiver from [facility] brought pt back to ED, Dr. Eulis Foster notified and asked this RN to move him back to a room so he could discuss findings.

## 2021-07-21 NOTE — ED Notes (Addendum)
This RN called [facility] to give update on pt. Informed nurse there to have family of pt call Dr. Eulis Foster to discuss findings of pts scan.

## 2021-07-21 NOTE — ED Notes (Addendum)
Pt ripped out IV, took off all monitoring devices, refuses to put them back on. States if the doctor does not come in by 2000, he is walking out because his ride was waiting for him. Dr. Eulis Foster notified.

## 2021-07-21 NOTE — Discharge Instructions (Addendum)
The testing today indicates that you have a right-sided lung cancer.  This is probably caused your weight loss, feeling bad, and likely contributed to the pneumonia.  At this time you do not appear to have a pneumonia.  It is important to follow-up with the pulmonologist listed on this document, as well as your PCP.  You will also need to follow-up with the oncologist.  That is usually done after seeing the pulmonologist.

## 2021-07-21 NOTE — Telephone Encounter (Addendum)
Yes - Chest X ray. Advised Dr Nicki Reaper will discuss with patient at office visit 07/24/21

## 2021-07-24 ENCOUNTER — Ambulatory Visit (HOSPITAL_COMMUNITY): Admission: RE | Admit: 2021-07-24 | Payer: Medicare Other | Source: Ambulatory Visit

## 2021-07-24 ENCOUNTER — Other Ambulatory Visit: Payer: Self-pay

## 2021-07-24 ENCOUNTER — Ambulatory Visit (INDEPENDENT_AMBULATORY_CARE_PROVIDER_SITE_OTHER): Payer: Medicare Other | Admitting: Family Medicine

## 2021-07-24 ENCOUNTER — Ambulatory Visit (HOSPITAL_COMMUNITY)
Admission: RE | Admit: 2021-07-24 | Discharge: 2021-07-24 | Disposition: A | Discharge status: Home / Self Care | Payer: Medicare Other | Source: Ambulatory Visit | Attending: Family Medicine | Admitting: Family Medicine

## 2021-07-24 VITALS — BP 134/83 | HR 123 | Temp 97.3°F | Ht 72.0 in | Wt 160.0 lb

## 2021-07-24 DIAGNOSIS — M25552 Pain in left hip: Secondary | ICD-10-CM | POA: Diagnosis not present

## 2021-07-24 DIAGNOSIS — R0989 Other specified symptoms and signs involving the circulatory and respiratory systems: Secondary | ICD-10-CM

## 2021-07-24 DIAGNOSIS — M25559 Pain in unspecified hip: Secondary | ICD-10-CM | POA: Insufficient documentation

## 2021-07-24 DIAGNOSIS — D491 Neoplasm of unspecified behavior of respiratory system: Secondary | ICD-10-CM

## 2021-07-24 DIAGNOSIS — D692 Other nonthrombocytopenic purpura: Secondary | ICD-10-CM | POA: Diagnosis not present

## 2021-07-24 MED ORDER — HYDROCODONE-ACETAMINOPHEN 10-325 MG PO TABS: ORAL_TABLET | ORAL | 0 refills | Status: DC

## 2021-07-24 NOTE — Progress Notes (Signed)
   Subjective:    Patient ID: [name], male    DOB: 02/24/1951, 70 y.o.   MRN: [id number]  HPI ER follow up CT scan results - Pain and not able to sleep through the night referral to oncology and pulmonology Patient unfortunately is having right-sided chest pain also some lower abdominal pain and left hip pain In addition to this he had a recent evaluation in the ER because of right-sided chest pain and hemoptysis this showed a tumor more than likely bronchogenic cancer He has been losing weight On his most recent visit we had discussed doing a follow-up chest x-ray and if that did not show clearing to go forward with CT scan of chest and abdomen ER went ahead and did this We discussed the results today with him and his sisters  Review of Systems     Objective:   Physical Exam Lungs are clear heart regular extremities no edema senile purpura noted on the arms       Assessment & Plan:  1. Hip pain X-ray ordered await the results. - DG HIP UNILAT WITH PELVIS 2-3 VIEWS LEFT  2. Lung tumor This is very concerning for bronchogenic cancer.  Patient would benefit from seeing pulmonary in oncology in rapid fashion.  More than likely will need a PET scan and bronchoscopy with needle biopsy Patient has quit smoking Patient understandably nervous - Ambulatory referral to Pulmonology - Ambulatory referral to Hematology / Oncology  3. Senile purpura (HCC) Bruising on the arms recent labs from ER reviewed  Patient has chronic back pain and knee pain from where he has a previous amputation.  He states hydrocodone is not helping him with his right side chest pain that he is having currently and left pain.  We will go ahead and increase the dose of the hydrocodone to 10 mg/325 1 every 6 hours 3 times daily during the day may take 1 in the middle of the night if necessary if severe pain  Weight loss-related to recent illness recommend purposely eating more than what he feels he  needs  Exposure to COVID some runny nose we will check COVID test

## 2021-07-25 LAB — SPECIMEN STATUS REPORT

## 2021-07-25 LAB — SARS-COV-2, NAA 2 DAY TAT

## 2021-07-25 LAB — NOVEL CORONAVIRUS, NAA: SARS-CoV-2, NAA: DETECTED — AB
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
## 2021-07-26 ENCOUNTER — Inpatient Hospital Stay (HOSPITAL_COMMUNITY): Payer: Medicare Other | Attending: Hematology | Admitting: Hematology

## 2021-07-26 ENCOUNTER — Other Ambulatory Visit: Payer: Self-pay

## 2021-07-26 ENCOUNTER — Encounter (HOSPITAL_COMMUNITY): Payer: Self-pay | Admitting: Hematology

## 2021-07-26 VITALS — BP 124/77 | HR 131 | Temp 98.4°F | Resp 20 | Ht 72.0 in | Wt 165.1 lb

## 2021-07-26 DIAGNOSIS — Z801 Family history of malignant neoplasm of trachea, bronchus and lung: Secondary | ICD-10-CM | POA: Diagnosis not present

## 2021-07-26 DIAGNOSIS — Z89511 Acquired absence of right leg below knee: Secondary | ICD-10-CM | POA: Insufficient documentation

## 2021-07-26 DIAGNOSIS — Z87891 Personal history of nicotine dependence: Secondary | ICD-10-CM | POA: Diagnosis not present

## 2021-07-26 DIAGNOSIS — K8020 Calculus of gallbladder without cholecystitis without obstruction: Secondary | ICD-10-CM | POA: Diagnosis not present

## 2021-07-26 DIAGNOSIS — R634 Abnormal weight loss: Secondary | ICD-10-CM

## 2021-07-26 DIAGNOSIS — J439 Emphysema, unspecified: Secondary | ICD-10-CM | POA: Diagnosis not present

## 2021-07-26 DIAGNOSIS — Z888 Allergy status to other drugs, medicaments and biological substances status: Secondary | ICD-10-CM | POA: Insufficient documentation

## 2021-07-26 DIAGNOSIS — R079 Chest pain, unspecified: Secondary | ICD-10-CM

## 2021-07-26 DIAGNOSIS — R630 Anorexia: Secondary | ICD-10-CM | POA: Diagnosis not present

## 2021-07-26 DIAGNOSIS — C3401 Malignant neoplasm of right main bronchus: Secondary | ICD-10-CM

## 2021-07-26 DIAGNOSIS — Z79899 Other long term (current) drug therapy: Secondary | ICD-10-CM | POA: Diagnosis not present

## 2021-07-26 DIAGNOSIS — R042 Hemoptysis: Secondary | ICD-10-CM

## 2021-07-26 DIAGNOSIS — U071 COVID-19: Secondary | ICD-10-CM

## 2021-07-26 DIAGNOSIS — Z886 Allergy status to analgesic agent status: Secondary | ICD-10-CM | POA: Insufficient documentation

## 2021-07-26 DIAGNOSIS — E785 Hyperlipidemia, unspecified: Secondary | ICD-10-CM | POA: Diagnosis not present

## 2021-07-26 DIAGNOSIS — M47817 Spondylosis without myelopathy or radiculopathy, lumbosacral region: Secondary | ICD-10-CM

## 2021-07-26 DIAGNOSIS — R590 Localized enlarged lymph nodes: Secondary | ICD-10-CM | POA: Diagnosis not present

## 2021-07-26 DIAGNOSIS — K449 Diaphragmatic hernia without obstruction or gangrene: Secondary | ICD-10-CM | POA: Diagnosis not present

## 2021-07-26 DIAGNOSIS — M47816 Spondylosis without myelopathy or radiculopathy, lumbar region: Secondary | ICD-10-CM | POA: Diagnosis not present

## 2021-07-26 DIAGNOSIS — I700 Atherosclerosis of aorta: Secondary | ICD-10-CM | POA: Diagnosis not present

## 2021-07-26 DIAGNOSIS — F419 Anxiety disorder, unspecified: Secondary | ICD-10-CM | POA: Diagnosis not present

## 2021-07-26 DIAGNOSIS — C3490 Malignant neoplasm of unspecified part of unspecified bronchus or lung: Secondary | ICD-10-CM

## 2021-07-26 DIAGNOSIS — M25552 Pain in left hip: Secondary | ICD-10-CM

## 2021-07-26 NOTE — Patient Instructions (Addendum)
[place] at [facility] Discharge Instructions  You were seen and examined today by Dr. Delton Coombes. Dr. Delton Coombes is a medical oncologist, meaning he specializes in cancer diagnoses. Dr. Delton Coombes discussed your past medical history, family history of cancer, and the events that led to you being here today.  Your recent CT scan revealed a mass that is concerning for cancer. Dr. Delton Coombes has recommended a PET scan, which is a specialized CT scan that illuminates where there is cancer present in the body. Dr. Delton Coombes has also recommended a brain MRI, this is standard for any noted lung mass. Dr. Delton Coombes also recommended seeing the Pulmonologist (Lung Doctor) that Dr. Wolfgang Phoenix referred you to. If you have not hear from the Pulmonologist within a week, please call and let our office know.  Follow-up as scheduled.   Thank you for choosing [place] at [facility] to provide your oncology and hematology care.  To afford each patient quality time with our provider, please arrive at least 15 minutes before your scheduled appointment time.   If you have a lab appointment with the [facility] please come in thru the Main Entrance and check in at the main information desk.  You need to re-schedule your appointment should you arrive 10 or more minutes late.  We strive to give you quality time with our providers, and arriving late affects you and other patients whose appointments are after yours.  Also, if you no show three or more times for appointments you may be dismissed from the clinic at the providers discretion.     Again, thank you for choosing [facility].  Our hope is that these requests will decrease the amount of time that you wait before being seen by our physicians.       _____________________________________________________________  Should you have questions after your visit to [facility], please contact our  office at [phone] and follow the prompts.  Our office hours are 8:00 a.m. and 4:30 p.m. Monday - Friday.  Please note that voicemails left after 4:00 p.m. may not be returned until the following business day.  We are closed weekends and major holidays.  You do have access to a nurse 24-7, just call the main number to the clinic [phone] and do not press any options, hold on the line and a nurse will answer the phone.    For prescription refill requests, have your pharmacy contact our office and allow 72 hours.    Due to Covid, you will need to wear a mask upon entering the hospital. If you do not have a mask, a mask will be given to you at the Main Entrance upon arrival. For doctor visits, patients may have 1 support person age 74 or older with them. For treatment visits, patients can not have anyone with them due to social distancing guidelines and our immunocompromised population.

## 2021-07-27 ENCOUNTER — Other Ambulatory Visit: Payer: Self-pay | Admitting: Family Medicine

## 2021-07-27 MED ORDER — OXYCODONE-ACETAMINOPHEN 5-325 MG PO TABS: ORAL_TABLET | ORAL | 0 refills | Status: DC

## 2021-07-27 MED ORDER — NIRMATRELVIR/RITONAVIR (PAXLOVID)TABLET: 3.0000 | ORAL_TABLET | Freq: Two times a day (BID) | ORAL | 0 refills | Status: AC

## 2021-07-27 NOTE — Progress Notes (Signed)
Patient with increasing pain associated with his lung growths.  We will stop hydrocodone and initiate oxycodone med check was spoken with at [facility]  They state his breathing is doing okay but he does have some cough he is COVID-positive we will start Paxlovid kidney function normal.  I did speak with pharmacist to [facility] no drug interactions

## 2021-08-02 ENCOUNTER — Telehealth: Payer: Self-pay | Admitting: Family Medicine

## 2021-08-02 NOTE — Telephone Encounter (Signed)
We will connect with pulmonary directly to try to get this patient scheduled.  Referral channels have been less than optimal for this particular case

## 2021-08-02 NOTE — Telephone Encounter (Signed)
Nurses I was able to schedule his appointment with the pulmonologist He will be seeing Dr.Icard who is a pulmonologist with [facility] in [facility] Appointment date Thursday, September 8 at 9:30 AM He needs to arrive at 9:15 AM Address [address] [place]. [address] I tried to call his Sister [name], I left a message for her to call to get this information Please also go ahead and call [facility] with this information And also tomorrow call [name] again if she does not call back (They were unable to see him here in [place] office plus the type of pulmonologist he needs to see who would do bronchoscopy with biopsy is this particular Dr. In [place]) Very important that he is seen next week to get the ball rolling on all of this.

## 2021-08-03 ENCOUNTER — Other Ambulatory Visit: Payer: Self-pay

## 2021-08-03 ENCOUNTER — Encounter (HOSPITAL_COMMUNITY)
Admission: RE | Admit: 2021-08-03 | Discharge: 2021-08-03 | Disposition: A | Discharge status: Home / Self Care | Payer: Medicare Other | Source: Ambulatory Visit | Attending: Hematology | Admitting: Hematology

## 2021-08-03 DIAGNOSIS — C3490 Malignant neoplasm of unspecified part of unspecified bronchus or lung: Secondary | ICD-10-CM | POA: Diagnosis not present

## 2021-08-03 DIAGNOSIS — J439 Emphysema, unspecified: Secondary | ICD-10-CM | POA: Diagnosis not present

## 2021-08-03 DIAGNOSIS — C3491 Malignant neoplasm of unspecified part of right bronchus or lung: Secondary | ICD-10-CM | POA: Insufficient documentation

## 2021-08-03 DIAGNOSIS — R590 Localized enlarged lymph nodes: Secondary | ICD-10-CM | POA: Insufficient documentation

## 2021-08-03 DIAGNOSIS — C7951 Secondary malignant neoplasm of bone: Secondary | ICD-10-CM | POA: Insufficient documentation

## 2021-08-03 DIAGNOSIS — K8020 Calculus of gallbladder without cholecystitis without obstruction: Secondary | ICD-10-CM | POA: Diagnosis not present

## 2021-08-03 DIAGNOSIS — I2510 Atherosclerotic heart disease of native coronary artery without angina pectoris: Secondary | ICD-10-CM | POA: Diagnosis not present

## 2021-08-03 MED ORDER — FLUDEOXYGLUCOSE F - 18 (FDG) INJECTION
8.5800 | Freq: Once | INTRAVENOUS | Status: AC | PRN
  Administered 2021-08-03: 8.58 via INTRAVENOUS

## 2021-08-03 NOTE — Telephone Encounter (Signed)
Sister [name] returned the call and notified of appt. [facility] notified of appt

## 2021-08-04 ENCOUNTER — Ambulatory Visit (HOSPITAL_COMMUNITY)
Admission: RE | Admit: 2021-08-04 | Discharge: 2021-08-04 | Disposition: A | Discharge status: Home / Self Care | Payer: Medicare Other | Source: Ambulatory Visit | Attending: Hematology | Admitting: Hematology

## 2021-08-04 DIAGNOSIS — C3490 Malignant neoplasm of unspecified part of unspecified bronchus or lung: Secondary | ICD-10-CM | POA: Diagnosis not present

## 2021-08-04 DIAGNOSIS — J019 Acute sinusitis, unspecified: Secondary | ICD-10-CM | POA: Diagnosis not present

## 2021-08-04 DIAGNOSIS — G319 Degenerative disease of nervous system, unspecified: Secondary | ICD-10-CM | POA: Diagnosis not present

## 2021-08-04 MED ORDER — GADOBUTROL 1 MMOL/ML IV SOLN
7.5000 mL | Freq: Once | INTRAVENOUS | Status: AC | PRN
  Administered 2021-08-04: 7.5 mL via INTRAVENOUS

## 2021-08-08 ENCOUNTER — Telehealth: Payer: Self-pay | Admitting: Family Medicine

## 2021-08-08 ENCOUNTER — Other Ambulatory Visit: Payer: Self-pay | Admitting: Family Medicine

## 2021-08-08 MED ORDER — OXYCODONE-ACETAMINOPHEN 5-325 MG PO TABS: ORAL_TABLET | ORAL | 0 refills | Status: DC

## 2021-08-08 NOTE — Telephone Encounter (Signed)
Prescription was approved through [facility]

## 2021-08-08 NOTE — Telephone Encounter (Signed)
[facility] is requesting refill on oxycodone  for patient. Please advise .[facility]

## 2021-08-08 NOTE — Telephone Encounter (Signed)
[facility] contacted and verbalized understanding
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
## 2021-08-09 ENCOUNTER — Other Ambulatory Visit (HOSPITAL_COMMUNITY): Payer: Self-pay

## 2021-08-09 ENCOUNTER — Ambulatory Visit (HOSPITAL_COMMUNITY)
Admission: RE | Admit: 2021-08-09 | Discharge: 2021-08-09 | Disposition: A | Discharge status: Home / Self Care | Payer: Medicare Other | Source: Ambulatory Visit | Attending: Hematology | Admitting: Hematology

## 2021-08-09 ENCOUNTER — Other Ambulatory Visit: Payer: Self-pay

## 2021-08-09 ENCOUNTER — Inpatient Hospital Stay (HOSPITAL_COMMUNITY): Payer: Medicare Other | Attending: Hematology | Admitting: Hematology

## 2021-08-09 VITALS — BP 121/91 | HR 133 | Temp 98.2°F | Resp 20 | Wt 160.0 lb

## 2021-08-09 DIAGNOSIS — J439 Emphysema, unspecified: Secondary | ICD-10-CM | POA: Diagnosis not present

## 2021-08-09 DIAGNOSIS — J189 Pneumonia, unspecified organism: Secondary | ICD-10-CM | POA: Diagnosis not present

## 2021-08-09 DIAGNOSIS — R591 Generalized enlarged lymph nodes: Secondary | ICD-10-CM | POA: Diagnosis not present

## 2021-08-09 DIAGNOSIS — C3490 Malignant neoplasm of unspecified part of unspecified bronchus or lung: Secondary | ICD-10-CM

## 2021-08-09 DIAGNOSIS — R590 Localized enlarged lymph nodes: Secondary | ICD-10-CM | POA: Diagnosis not present

## 2021-08-09 LAB — COMPREHENSIVE METABOLIC PANEL
ALT: 42 U/L (ref 0–44)
AST: 70 U/L — ABNORMAL HIGH (ref 15–41)
Albumin: 3.5 g/dL (ref 3.5–5.0)
Alkaline Phosphatase: 280 U/L — ABNORMAL HIGH (ref 38–126)
Anion gap: 13 (ref 5–15)
BUN: 21 mg/dL (ref 8–23)
CO2: 30 mmol/L (ref 22–32)
Calcium: 9.8 mg/dL (ref 8.9–10.3)
Chloride: 91 mmol/L — ABNORMAL LOW (ref 98–111)
Creatinine, Ser: 0.87 mg/dL (ref 0.61–1.24)
GFR, Estimated: >60 mL/min (ref >60)
Glucose, Bld: 191 mg/dL — ABNORMAL HIGH (ref 70–99)
Potassium: 4.1 mmol/L (ref 3.5–5.1)
Sodium: 134 mmol/L — ABNORMAL LOW (ref 135–145)
Total Bilirubin: 1 mg/dL (ref 0.3–1.2)
Total Protein: 8.2 g/dL — ABNORMAL HIGH (ref 6.5–8.1)

## 2021-08-09 LAB — CBC WITH DIFFERENTIAL/PLATELET
Abs Immature Granulocytes: 0.75 10*3/uL — ABNORMAL HIGH (ref 0.00–0.07)
Basophils Absolute: 0 10*3/uL (ref 0.0–0.1)
Basophils Relative: 0 %
Eosinophils Absolute: 0 10*3/uL (ref 0.0–0.5)
Eosinophils Relative: 0 %
HCT: 50 % (ref 39.0–52.0)
Hemoglobin: 17.1 g/dL — ABNORMAL HIGH (ref 13.0–17.0)
Immature Granulocytes: 6 %
Lymphocytes Relative: 21 %
Lymphs Abs: 2.5 10*3/uL (ref 0.7–4.0)
MCH: 30.6 pg (ref 26.0–34.0)
MCHC: 34.2 g/dL (ref 30.0–36.0)
MCV: 89.6 fL (ref 80.0–100.0)
Monocytes Absolute: 0.4 10*3/uL (ref 0.1–1.0)
Monocytes Relative: 4 %
Neutro Abs: 8.4 10*3/uL — ABNORMAL HIGH (ref 1.7–7.7)
Neutrophils Relative %: 69 %
Platelets: 83 10*3/uL — ABNORMAL LOW (ref 150–400)
RBC: 5.58 MIL/uL (ref 4.22–5.81)
RDW: 12.5 % (ref 11.5–15.5)
WBC: 12.1 10*3/uL — ABNORMAL HIGH (ref 4.0–10.5)
nRBC: 0.6 % — ABNORMAL HIGH (ref 0.0–0.2)

## 2021-08-09 LAB — MAGNESIUM: Magnesium: 2.1 mg/dL (ref 1.7–2.4)

## 2021-08-09 LAB — LACTATE DEHYDROGENASE: LDH: 2665 U/L — ABNORMAL HIGH (ref 98–192)

## 2021-08-09 MED ORDER — OXYCODONE HCL 10 MG PO TABS: 10.0000 mg | ORAL_TABLET | Freq: Four times a day (QID) | ORAL | 0 refills | Status: DC

## 2021-08-09 NOTE — Progress Notes (Signed)
Patient's labs drawn from [facility] using 22G butterfly needle. Patient O2 sats 95% on RA. Patient discharged via wheelchair and in stable condition. Patient to get CXR today.

## 2021-08-09 NOTE — Patient Instructions (Signed)
[place] at [facility] Discharge Instructions  You were seen today by Dr. Delton Coombes. He went over your recent results and scans. Dr. Delton Coombes will see you back in for labs and follow up.   Thank you for choosing [place] at [facility] to provide your oncology and hematology care.  To afford each patient quality time with our provider, please arrive at least 15 minutes before your scheduled appointment time.   If you have a lab appointment with the [facility] please come in thru the Main Entrance and check in at the main information desk  You need to re-schedule your appointment should you arrive 10 or more minutes late.  We strive to give you quality time with our providers, and arriving late affects you and other patients whose appointments are after yours.  Also, if you no show three or more times for appointments you may be dismissed from the clinic at the providers discretion.     Again, thank you for choosing [facility].  Our hope is that these requests will decrease the amount of time that you wait before being seen by our physicians.       _____________________________________________________________  Should you have questions after your visit to [facility], please contact our office at [phone] between the hours of 8:00 a.m. and 4:30 p.m.  Voicemails left after 4:00 p.m. will not be returned until the following business day.  For prescription refill requests, have your pharmacy contact our office and allow 72 hours.    Cancer Center Support Programs:   > Cancer Support Group  2nd Tuesday of the month 1pm-2pm, Journey Room

## 2021-08-10 ENCOUNTER — Encounter: Payer: Self-pay | Admitting: Pulmonary Disease

## 2021-08-10 ENCOUNTER — Ambulatory Visit (INDEPENDENT_AMBULATORY_CARE_PROVIDER_SITE_OTHER): Payer: Medicare Other | Admitting: Pulmonary Disease

## 2021-08-10 ENCOUNTER — Telehealth: Payer: Self-pay | Admitting: Pulmonary Disease

## 2021-08-10 VITALS — BP 112/62 | HR 120 | Temp 98.1°F | Ht 72.0 in | Wt 153.6 lb

## 2021-08-10 DIAGNOSIS — Z720 Tobacco use: Secondary | ICD-10-CM

## 2021-08-10 DIAGNOSIS — R599 Enlarged lymph nodes, unspecified: Secondary | ICD-10-CM

## 2021-08-10 DIAGNOSIS — R918 Other nonspecific abnormal finding of lung field: Secondary | ICD-10-CM

## 2021-08-10 DIAGNOSIS — C7951 Secondary malignant neoplasm of bone: Secondary | ICD-10-CM

## 2021-08-10 NOTE — Telephone Encounter (Signed)
Pt informed of the following:  Covid test 9/16 between 8-3  EBUS 9/20 at 1:00pm; 10:30am arrival time.  Case [phone]

## 2021-08-10 NOTE — Patient Instructions (Signed)
Thank you for visiting Dr. Valeta Harms at [facility]. Today we recommend the following:  Orders Placed This Encounter  Procedures   Procedural/ Surgical Case Request: VIDEO BRONCHOSCOPY WITH ENDOBRONCHIAL ULTRASOUND   Ambulatory referral to Pulmonology   Tentative bronchoscopy date on 08/22/2021  Return in about 4 weeks (around Sep 13, 2021).    Please do your part to reduce the spread of COVID-19.

## 2021-08-10 NOTE — Progress Notes (Signed)
Synopsis: Referred in September 2022 for lung mass by [name], MD  Subjective:   PATIENT ID: [name] GENDER: male DOB: 12/04/1950, MRN: [id number]  Chief Complaint  Patient presents with   Consult    Pt is being referred due to lung nodule seen on imaging.  Pt states that he does have complaints of SOB that can even happen at anytime, cough with occ bloody phlegm, and some chest discomfort.    70 year old gentleman, past medical history of GERD, COPD, tobacco use.  Here today for evaluation of abnormal CT imaging.  Nuclear medicine pet imaging with bulky mediastinal adenopathy, lung mass and osseous metastatic disease concerning for advanced age bronchogenic carcinoma.  Patient has already seen medical oncology.  We are awaiting tissue biopsy for next steps.  We are here today to discuss this.   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  Social History   Socioeconomic History   Marital status: Divorced    Spouse name: Not on file   Number of children: Not on file   Years of education: Not on file   Highest education level: Not on file  Occupational History   Not on file  Tobacco Use   Smoking status: Light Smoker    Packs/day: 1.00    Years: 50.00    Pack years: 50.00    Types: Cigarettes    Start date: 70   Smokeless tobacco: Never   Tobacco comments:    Currently smoking 1cig a day as of 08/10/21 ep  Substance and Sexual Activity   Alcohol use: No   Drug use: Not on file   Sexual activity: Not Currently  Other Topics Concern    Not on file  Social History Narrative   Not on file   Social Determinants of Health   Financial Resource Strain: Low Risk    Difficulty of Paying Living Expenses: Not hard at all  Food Insecurity: No Food Insecurity   Worried About Charity fundraiser in the Last Year: Never true   [place] in the Last Year: Never true  Transportation Needs: No Transportation Needs   Lack of Transportation (Medical): No   Lack of Transportation (Non-Medical): No  Physical Activity: Inactive   Days of Exercise per Week: 0 days   Minutes of Exercise per Session: 0 min  Stress: No Stress Concern Present   Feeling of Stress : Only a little  Social Connections: Unknown   Frequency of Communication with Friends and Family: More than three times a week   Frequency of Social Gatherings with Friends and Family: More than three times a week   Attends Religious Services: Never   Marine scientist or Organizations: No   Attends Archivist Meetings: Never   Marital Status: Not on file  Intimate Partner Violence: Not At Risk   Fear of Current or Ex-Partner: No   Emotionally Abused: No  Physically Abused: No   Sexually Abused: No     Allergies  Allergen Reactions   Asa [Aspirin] Other (See Comments)    Intolerance to aspirin due to ulcer   Benadryl [Diphenhydramine Hcl] Other (See Comments)    Nervousness, insomnia     Outpatient Medications Prior to Visit  Medication Sig Dispense Refill   acetaminophen (TYLENOL) 500 MG tablet Take 500 mg by mouth every 6 (six) hours as needed for mild pain, fever or headache.     ALPRAZolam (XANAX) 0.5 MG tablet TAKE 1 TABLET BY MOUTH 2 TIMES A DAY. (1 AT 8AM & 1 AT 2PM.) 60 tablet 5   budesonide-formoterol (SYMBICORT) 160-4.5 MCG/ACT inhaler Inhale 2 puffs into the lungs 2 (two) times daily. 1 each 3   dextromethorphan-guaiFENesin (TUSSIN DM) 10-100 MG/5ML liquid Take 5 mLs by mouth every 4 (four) hours as needed for cough.     hyoscyamine  (LEVSIN SL) 0.125 MG SL tablet PLACE 1 TABLET UNDER THE TONGUE EVERY 6 HOURS AS NEEDED FOR CRAMPS 30 tablet 5   nortriptyline (PAMELOR) 50 MG capsule Take 2 capsules (100 mg total) by mouth at bedtime. 60 capsule 5   omeprazole (PRILOSEC) 40 MG capsule Take 1 capsule (40 mg total) by mouth daily. 30 capsule 5   Oxycodone HCl 10 MG TABS Take 1 tablet (10 mg total) by mouth every 6 (six) hours. 180 tablet 0   oxyCODONE-acetaminophen (PERCOCET/ROXICET) 5-325 MG tablet 1 tablet every 6 hours for pain hold if drowsy or asleep 60 tablet 0   temazepam (RESTORIL) 15 MG capsule Take 1 capsule (15 mg total) by mouth at bedtime. 30 capsule 3   VENTOLIN HFA 108 (90 Base) MCG/ACT inhaler INHALE 2 PUFFS INTO THE LUNGS EVERY 4 HOURS AS NEEDED FOR WHEEZING. 18 g 5   No facility-administered medications prior to visit.    ROS   Objective:  Physical Exam   Vitals:   08/10/21 0932  BP: 112/62  Pulse: (!) 120  Temp: 98.1 F (36.7 C)  TempSrc: Oral  SpO2: 93%  Weight: 153 lb 9.6 oz (69.7 kg)  Height: 6' (1.829 m)   93% on RA BMI Readings from Last 3 Encounters:  08/10/21 20.83 kg/m  08/09/21 21.70 kg/m  07/26/21 22.39 kg/m   Wt Readings from Last 3 Encounters:  08/10/21 153 lb 9.6 oz (69.7 kg)  08/09/21 160 lb (72.6 kg)  07/26/21 165 lb 1.6 oz (74.9 kg)     CBC    Component Value Date/Time   WBC 12.1 (H) 08/09/2021 1116   RBC 5.58 08/09/2021 1116   HGB 17.1 (H) 08/09/2021 1116   HGB 15.6 07/13/2021 1155   HCT 50.0 08/09/2021 1116   HCT 45.1 07/13/2021 1155   PLT 83 (L) 08/09/2021 1116   PLT 378 07/13/2021 1155   MCV 89.6 08/09/2021 1116   MCV 91 07/13/2021 1155   MCH 30.6 08/09/2021 1116   MCHC 34.2 08/09/2021 1116   RDW 12.5 08/09/2021 1116   RDW 11.8 07/13/2021 1155   LYMPHSABS 2.5 08/09/2021 1116   LYMPHSABS 2.0 07/13/2021 1155   MONOABS 0.4 08/09/2021 1116   EOSABS 0.0 08/09/2021 1116   EOSABS 0.2 07/13/2021 1155   BASOSABS 0.0 08/09/2021 1116   BASOSABS 0.0  07/13/2021 1155     Chest Imaging: 08/03/2021 nuclear medicine pet imaging: Patient has hypermetabolic mass within the mediastinum as well as paratracheal and subcarinal adenopathy concerning for an advanced age bronchogenic carcinoma. The patient's images have been independently reviewed by me.  Pulmonary Functions Testing Results: PFT Results Latest Ref Rng & Units 01/21/2018  FVC-Pre L 2.43  FVC-Predicted Pre % 48  FVC-Post L 3.03  FVC-Predicted Post % 60  Pre FEV1/FVC % % 51  Post FEV1/FCV % % 45  FEV1-Pre L 1.24  FEV1-Predicted Pre % 33  FEV1-Post L 1.36  DLCO uncorrected ml/min/mmHg 16.58  DLCO UNC% % 47  DLCO corrected ml/min/mmHg 16.22  DLCO COR %Predicted % 46  DLVA Predicted % 68  TLC L 9.21  TLC % Predicted % 124  RV % Predicted % 266    FeNO:   Pathology:   Echocardiogram:   Heart Catheterization:     Assessment & Plan:     ICD-10-CM   1. Lung mass  R91.8 Procedural/ Surgical Case Request: VIDEO BRONCHOSCOPY WITH ENDOBRONCHIAL ULTRASOUND    Ambulatory referral to Pulmonology    2. Adenopathy  R59.9 Procedural/ Surgical Case Request: VIDEO BRONCHOSCOPY WITH ENDOBRONCHIAL ULTRASOUND    Ambulatory referral to Pulmonology    3. Tobacco use  Z72.0     4. Bone metastasis ([place])  C79.51       Discussion:  This is a 70 year old gentleman, large lung mass with mediastinal adenopathy concerning for advanced age bronchogenic carcinoma.  Nuclear medicine pet imaging also has lesions within the sternum and then the right arm.  These bony metastasis are also present concerning for stage IV disease.  Plan: Patient needs tissue biopsy of the soft tissue for molecular diagnostics. We discussed today that risk benefits and alternatives of bronchoscopy. Patient is agreeable to proceed. We will plan for this on 08/22/2021. We would like to do this sooner however there is no available endoscopy staff due to current staffing crisis and there is no slot availability  for next week.    Current Outpatient Medications:    acetaminophen (TYLENOL) 500 MG tablet, Take 500 mg by mouth every 6 (six) hours as needed for mild pain, fever or headache., Disp: , Rfl:    ALPRAZolam (XANAX) 0.5 MG tablet, TAKE 1 TABLET BY MOUTH 2 TIMES A DAY. (1 AT 8AM & 1 AT 2PM.), Disp: 60 tablet, Rfl: 5   budesonide-formoterol (SYMBICORT) 160-4.5 MCG/ACT inhaler, Inhale 2 puffs into the lungs 2 (two) times daily., Disp: 1 each, Rfl: 3   dextromethorphan-guaiFENesin (TUSSIN DM) 10-100 MG/5ML liquid, Take 5 mLs by mouth every 4 (four) hours as needed for cough., Disp: , Rfl:    hyoscyamine (LEVSIN SL) 0.125 MG SL tablet, PLACE 1 TABLET UNDER THE TONGUE EVERY 6 HOURS AS NEEDED FOR CRAMPS, Disp: 30 tablet, Rfl: 5   nortriptyline (PAMELOR) 50 MG capsule, Take 2 capsules (100 mg total) by mouth at bedtime., Disp: 60 capsule, Rfl: 5   omeprazole (PRILOSEC) 40 MG capsule, Take 1 capsule (40 mg total) by mouth daily., Disp: 30 capsule, Rfl: 5   Oxycodone HCl 10 MG TABS, Take 1 tablet (10 mg total) by mouth every 6 (six) hours., Disp: 180 tablet, Rfl: 0   oxyCODONE-acetaminophen (PERCOCET/ROXICET) 5-325 MG tablet, 1 tablet every 6 hours for pain hold if drowsy or asleep, Disp: 60 tablet, Rfl: 0   temazepam (RESTORIL) 15 MG capsule, Take 1 capsule (15 mg total) by mouth at bedtime., Disp: 30 capsule, Rfl: 3   VENTOLIN HFA 108 (90 Base) MCG/ACT inhaler, INHALE 2 PUFFS INTO THE LUNGS EVERY 4 HOURS AS NEEDED FOR WHEEZING., Disp: 18 g, Rfl: 5    Garner Nash, DO [facility] 08/10/2021 9:44 AM

## 2021-08-14 ENCOUNTER — Other Ambulatory Visit: Payer: Self-pay

## 2021-08-14 ENCOUNTER — Emergency Department (HOSPITAL_COMMUNITY): Payer: Medicare Other

## 2021-08-14 ENCOUNTER — Inpatient Hospital Stay (HOSPITAL_COMMUNITY)
Admission: EM | Admit: 2021-08-14 | Discharge: 2021-08-18 | DRG: 640 | Disposition: A | Discharge status: Skilled Nursing Facility | Payer: Medicare Other | Attending: Family Medicine | Admitting: Family Medicine

## 2021-08-14 ENCOUNTER — Ambulatory Visit (INDEPENDENT_AMBULATORY_CARE_PROVIDER_SITE_OTHER): Payer: Medicare Other | Admitting: Family Medicine

## 2021-08-14 ENCOUNTER — Encounter (HOSPITAL_COMMUNITY): Payer: Self-pay | Admitting: Emergency Medicine

## 2021-08-14 VITALS — BP 118/76 | HR 133 | Temp 97.3°F | Ht 72.0 in | Wt 151.0 lb

## 2021-08-14 DIAGNOSIS — D696 Thrombocytopenia, unspecified: Secondary | ICD-10-CM

## 2021-08-14 DIAGNOSIS — C3491 Malignant neoplasm of unspecified part of right bronchus or lung: Secondary | ICD-10-CM | POA: Diagnosis not present

## 2021-08-14 DIAGNOSIS — R7401 Elevation of levels of liver transaminase levels: Secondary | ICD-10-CM

## 2021-08-14 DIAGNOSIS — F17210 Nicotine dependence, cigarettes, uncomplicated: Secondary | ICD-10-CM | POA: Diagnosis present

## 2021-08-14 DIAGNOSIS — R221 Localized swelling, mass and lump, neck: Secondary | ICD-10-CM | POA: Diagnosis not present

## 2021-08-14 DIAGNOSIS — R531 Weakness: Secondary | ICD-10-CM | POA: Diagnosis not present

## 2021-08-14 DIAGNOSIS — E872 Acidosis, unspecified: Secondary | ICD-10-CM

## 2021-08-14 DIAGNOSIS — E43 Unspecified severe protein-calorie malnutrition: Secondary | ICD-10-CM | POA: Diagnosis present

## 2021-08-14 DIAGNOSIS — D630 Anemia in neoplastic disease: Secondary | ICD-10-CM | POA: Diagnosis present

## 2021-08-14 DIAGNOSIS — J449 Chronic obstructive pulmonary disease, unspecified: Secondary | ICD-10-CM | POA: Diagnosis present

## 2021-08-14 DIAGNOSIS — R000 Tachycardia, unspecified: Secondary | ICD-10-CM

## 2021-08-14 DIAGNOSIS — Z6821 Body mass index (BMI) 21.0-21.9, adult: Secondary | ICD-10-CM

## 2021-08-14 DIAGNOSIS — R1310 Dysphagia, unspecified: Secondary | ICD-10-CM | POA: Diagnosis present

## 2021-08-14 DIAGNOSIS — E785 Hyperlipidemia, unspecified: Secondary | ICD-10-CM | POA: Diagnosis present

## 2021-08-14 DIAGNOSIS — Z89511 Acquired absence of right leg below knee: Secondary | ICD-10-CM

## 2021-08-14 DIAGNOSIS — R634 Abnormal weight loss: Secondary | ICD-10-CM | POA: Diagnosis not present

## 2021-08-14 DIAGNOSIS — K449 Diaphragmatic hernia without obstruction or gangrene: Secondary | ICD-10-CM | POA: Diagnosis not present

## 2021-08-14 DIAGNOSIS — Z8616 Personal history of COVID-19: Secondary | ICD-10-CM | POA: Diagnosis not present

## 2021-08-14 DIAGNOSIS — E860 Dehydration: Principal | ICD-10-CM | POA: Diagnosis present

## 2021-08-14 DIAGNOSIS — Z79899 Other long term (current) drug therapy: Secondary | ICD-10-CM

## 2021-08-14 DIAGNOSIS — Z66 Do not resuscitate: Secondary | ICD-10-CM | POA: Diagnosis present

## 2021-08-14 DIAGNOSIS — Z20822 Contact with and (suspected) exposure to covid-19: Secondary | ICD-10-CM | POA: Diagnosis not present

## 2021-08-14 DIAGNOSIS — K219 Gastro-esophageal reflux disease without esophagitis: Secondary | ICD-10-CM | POA: Diagnosis present

## 2021-08-14 DIAGNOSIS — G4700 Insomnia, unspecified: Secondary | ICD-10-CM | POA: Diagnosis present

## 2021-08-14 DIAGNOSIS — R918 Other nonspecific abnormal finding of lung field: Secondary | ICD-10-CM | POA: Diagnosis not present

## 2021-08-14 DIAGNOSIS — D6959 Other secondary thrombocytopenia: Secondary | ICD-10-CM | POA: Diagnosis present

## 2021-08-14 DIAGNOSIS — R109 Unspecified abdominal pain: Secondary | ICD-10-CM

## 2021-08-14 DIAGNOSIS — R0902 Hypoxemia: Secondary | ICD-10-CM | POA: Diagnosis not present

## 2021-08-14 DIAGNOSIS — R0602 Shortness of breath: Secondary | ICD-10-CM | POA: Diagnosis not present

## 2021-08-14 DIAGNOSIS — E871 Hypo-osmolality and hyponatremia: Secondary | ICD-10-CM | POA: Diagnosis present

## 2021-08-14 DIAGNOSIS — R739 Hyperglycemia, unspecified: Secondary | ICD-10-CM | POA: Diagnosis present

## 2021-08-14 DIAGNOSIS — R591 Generalized enlarged lymph nodes: Secondary | ICD-10-CM | POA: Diagnosis not present

## 2021-08-14 DIAGNOSIS — F419 Anxiety disorder, unspecified: Secondary | ICD-10-CM | POA: Diagnosis present

## 2021-08-14 DIAGNOSIS — C3490 Malignant neoplasm of unspecified part of unspecified bronchus or lung: Secondary | ICD-10-CM

## 2021-08-14 DIAGNOSIS — Z7952 Long term (current) use of systemic steroids: Secondary | ICD-10-CM

## 2021-08-14 DIAGNOSIS — R042 Hemoptysis: Secondary | ICD-10-CM | POA: Diagnosis present

## 2021-08-14 HISTORY — DX: Malignant (primary) neoplasm, unspecified: C80.1

## 2021-08-14 LAB — CBC WITH DIFFERENTIAL/PLATELET
Abs Immature Granulocytes: 0.32 10*3/uL — ABNORMAL HIGH (ref 0.00–0.07)
Basophils Absolute: 0.1 10*3/uL (ref 0.0–0.1)
Basophils Relative: 1 %
Eosinophils Absolute: 0 10*3/uL (ref 0.0–0.5)
Eosinophils Relative: 0 %
HCT: 44.1 % (ref 39.0–52.0)
Hemoglobin: 15 g/dL (ref 13.0–17.0)
Immature Granulocytes: 6 %
Lymphocytes Relative: 34 %
Lymphs Abs: 1.9 10*3/uL (ref 0.7–4.0)
MCH: 30.4 pg (ref 26.0–34.0)
MCHC: 34 g/dL (ref 30.0–36.0)
MCV: 89.3 fL (ref 80.0–100.0)
Monocytes Absolute: 0.3 10*3/uL (ref 0.1–1.0)
Monocytes Relative: 5 %
Neutro Abs: 3 10*3/uL (ref 1.7–7.7)
Neutrophils Relative %: 54 %
Platelets: 56 10*3/uL — ABNORMAL LOW (ref 150–400)
RBC: 4.94 MIL/uL (ref 4.22–5.81)
RDW: 12.2 % (ref 11.5–15.5)
WBC: 5.6 10*3/uL (ref 4.0–10.5)
nRBC: 1.6 % — ABNORMAL HIGH (ref 0.0–0.2)

## 2021-08-14 LAB — COMPREHENSIVE METABOLIC PANEL
ALT: 34 U/L (ref 0–44)
AST: 58 U/L — ABNORMAL HIGH (ref 15–41)
Albumin: 3 g/dL — ABNORMAL LOW (ref 3.5–5.0)
Alkaline Phosphatase: 256 U/L — ABNORMAL HIGH (ref 38–126)
Anion gap: 12 (ref 5–15)
BUN: 28 mg/dL — ABNORMAL HIGH (ref 8–23)
CO2: 29 mmol/L (ref 22–32)
Calcium: 9.6 mg/dL (ref 8.9–10.3)
Chloride: 90 mmol/L — ABNORMAL LOW (ref 98–111)
Creatinine, Ser: 0.93 mg/dL (ref 0.61–1.24)
GFR, Estimated: >60 mL/min (ref >60)
Glucose, Bld: 142 mg/dL — ABNORMAL HIGH (ref 70–99)
Potassium: 4.5 mmol/L (ref 3.5–5.1)
Sodium: 131 mmol/L — ABNORMAL LOW (ref 135–145)
Total Bilirubin: 0.9 mg/dL (ref 0.3–1.2)
Total Protein: 7.1 g/dL (ref 6.5–8.1)

## 2021-08-14 LAB — PROTIME-INR
INR: 1.2 (ref 0.8–1.2)
Prothrombin Time: 15.2 seconds (ref 11.4–15.2)

## 2021-08-14 LAB — TSH: TSH: 1.903 u[IU]/mL (ref 0.350–4.500)

## 2021-08-14 LAB — APTT: aPTT: 31 seconds (ref 24–36)

## 2021-08-14 LAB — PHOSPHORUS: Phosphorus: 5 mg/dL — ABNORMAL HIGH (ref 2.5–4.6)

## 2021-08-14 LAB — MAGNESIUM: Magnesium: 2.1 mg/dL (ref 1.7–2.4)

## 2021-08-14 LAB — LACTIC ACID, PLASMA
Lactic Acid, Venous: 2.1 mmol/L (ref 0.5–1.9)
Lactic Acid, Venous: 2.8 mmol/L (ref 0.5–1.9)

## 2021-08-14 MED ORDER — IOHEXOL 350 MG/ML SOLN
125.0000 mL | Freq: Once | INTRAVENOUS | Status: AC | PRN
  Administered 2021-08-14: 100 mL via INTRAVENOUS

## 2021-08-14 MED ORDER — ONDANSETRON 8 MG PO TBDP: 8.0000 mg | ORAL_TABLET | Freq: Three times a day (TID) | ORAL | 6 refills | Status: DC | PRN

## 2021-08-14 NOTE — ED Triage Notes (Signed)
Pt sent over by pcp dr.luking for tachycardia, unable to eat, and weakness, and dehydration. Pt has hx of lung cancer.

## 2021-08-14 NOTE — ED Notes (Signed)
Patient transported to CT 

## 2021-08-14 NOTE — Progress Notes (Signed)
   Subjective:    Patient ID: [name], male    DOB: 09-10-1951, 70 y.o.   MRN: [id number]  HPI  Lung Mass follow up - has a biopsy of R lung scheduled for 9/20 w/ Dr  Raliegh Ip Patient with known metastatic lung cancer Has lung biopsy coming up on September 20 Had recent PET scan which showed metastatic disease into long bones as well as spine Over the past week and a half has not been eating or drinking Increasing fatigue and tiredness and lethargy Has lost some weight Complains of moderate nausea Oxycodone helping with pain but not totally removing it  Review of Systems     Objective:   Physical Exam  Blood pressure 88/62 Heart rate 130 O2 saturation 97% Patient has a wet cough with congested sounding bronchioles worse on the right than left Tachycardia Abdomen soft extremities weight loss is noted Skin turgor fair      Assessment & Plan:  Metastatic lung cancer Today a more pressing problem is probable dehydration secondary to not eating and drinking over the past 10 days  Due to the tachycardia and relative hypotension it is best for the patient to go to the emergency department for further evaluation.  May need chest x-ray lab work as well as IV fluids If released late tonight we will do a follow-up at his rest home If he is admitted we will do a follow-up after discharge Family is aware what is going on and agrees with this process ER physician and nursing staff spoke with myself

## 2021-08-14 NOTE — ED Notes (Signed)
Pt removed self from Vs monitoring system. Reconnected pt states he will removed himself if the alarm keeps going off. On initial assessment pt was 87% on room air placed on 2L nasal cannula SPO2 improved to 92%

## 2021-08-14 NOTE — ED Notes (Signed)
Pt provided with glass of water as requested and per EDP

## 2021-08-15 ENCOUNTER — Telehealth: Payer: Self-pay | Admitting: Family Medicine

## 2021-08-15 ENCOUNTER — Observation Stay (HOSPITAL_COMMUNITY): Payer: Medicare Other

## 2021-08-15 DIAGNOSIS — D696 Thrombocytopenia, unspecified: Secondary | ICD-10-CM | POA: Diagnosis not present

## 2021-08-15 DIAGNOSIS — E860 Dehydration: Secondary | ICD-10-CM

## 2021-08-15 DIAGNOSIS — E872 Acidosis, unspecified: Secondary | ICD-10-CM

## 2021-08-15 DIAGNOSIS — R000 Tachycardia, unspecified: Secondary | ICD-10-CM | POA: Diagnosis not present

## 2021-08-15 DIAGNOSIS — C3491 Malignant neoplasm of unspecified part of right bronchus or lung: Secondary | ICD-10-CM | POA: Diagnosis not present

## 2021-08-15 DIAGNOSIS — R531 Weakness: Secondary | ICD-10-CM | POA: Diagnosis not present

## 2021-08-15 DIAGNOSIS — K8020 Calculus of gallbladder without cholecystitis without obstruction: Secondary | ICD-10-CM | POA: Diagnosis not present

## 2021-08-15 DIAGNOSIS — R739 Hyperglycemia, unspecified: Secondary | ICD-10-CM | POA: Diagnosis not present

## 2021-08-15 DIAGNOSIS — R7401 Elevation of levels of liver transaminase levels: Secondary | ICD-10-CM

## 2021-08-15 DIAGNOSIS — R1084 Generalized abdominal pain: Secondary | ICD-10-CM | POA: Diagnosis not present

## 2021-08-15 DIAGNOSIS — R221 Localized swelling, mass and lump, neck: Secondary | ICD-10-CM | POA: Diagnosis not present

## 2021-08-15 DIAGNOSIS — Z20822 Contact with and (suspected) exposure to covid-19: Secondary | ICD-10-CM | POA: Diagnosis not present

## 2021-08-15 DIAGNOSIS — E871 Hypo-osmolality and hyponatremia: Secondary | ICD-10-CM | POA: Diagnosis not present

## 2021-08-15 DIAGNOSIS — K449 Diaphragmatic hernia without obstruction or gangrene: Secondary | ICD-10-CM | POA: Diagnosis not present

## 2021-08-15 DIAGNOSIS — C3490 Malignant neoplasm of unspecified part of unspecified bronchus or lung: Secondary | ICD-10-CM

## 2021-08-15 LAB — PHOSPHORUS: Phosphorus: 4.1 mg/dL (ref 2.5–4.6)

## 2021-08-15 LAB — CBC
HCT: 39.5 % (ref 39.0–52.0)
Hemoglobin: 13.4 g/dL (ref 13.0–17.0)
MCH: 30.2 pg (ref 26.0–34.0)
MCHC: 33.9 g/dL (ref 30.0–36.0)
MCV: 89.2 fL (ref 80.0–100.0)
Platelets: 51 10*3/uL — ABNORMAL LOW (ref 150–400)
RBC: 4.43 MIL/uL (ref 4.22–5.81)
RDW: 12.1 % (ref 11.5–15.5)
WBC: 5 10*3/uL (ref 4.0–10.5)
nRBC: 0.8 % — ABNORMAL HIGH (ref 0.0–0.2)

## 2021-08-15 LAB — COMPREHENSIVE METABOLIC PANEL
ALT: 29 U/L (ref 0–44)
AST: 54 U/L — ABNORMAL HIGH (ref 15–41)
Albumin: 2.6 g/dL — ABNORMAL LOW (ref 3.5–5.0)
Alkaline Phosphatase: 213 U/L — ABNORMAL HIGH (ref 38–126)
Anion gap: 11 (ref 5–15)
BUN: 21 mg/dL (ref 8–23)
CO2: 32 mmol/L (ref 22–32)
Calcium: 8.7 mg/dL — ABNORMAL LOW (ref 8.9–10.3)
Chloride: 92 mmol/L — ABNORMAL LOW (ref 98–111)
Creatinine, Ser: 0.75 mg/dL (ref 0.61–1.24)
GFR, Estimated: >60 mL/min (ref >60)
Glucose, Bld: 120 mg/dL — ABNORMAL HIGH (ref 70–99)
Potassium: 4.4 mmol/L (ref 3.5–5.1)
Sodium: 135 mmol/L (ref 135–145)
Total Bilirubin: 1 mg/dL (ref 0.3–1.2)
Total Protein: 6.2 g/dL — ABNORMAL LOW (ref 6.5–8.1)

## 2021-08-15 LAB — HIV ANTIBODY (ROUTINE TESTING W REFLEX): HIV Screen 4th Generation wRfx: NONREACTIVE

## 2021-08-15 LAB — MRSA NEXT GEN BY PCR, NASAL: MRSA by PCR Next Gen: NOT DETECTED

## 2021-08-15 LAB — PROTIME-INR
INR: 1.2 (ref 0.8–1.2)
Prothrombin Time: 15.3 seconds — ABNORMAL HIGH (ref 11.4–15.2)

## 2021-08-15 LAB — SARS CORONAVIRUS 2 (TAT 6-24 HRS): SARS Coronavirus 2: NEGATIVE

## 2021-08-15 LAB — LACTIC ACID, PLASMA
Lactic Acid, Venous: 1.4 mmol/L (ref 0.5–1.9)
Lactic Acid, Venous: 1.3 mmol/L (ref 0.5–1.9)

## 2021-08-15 LAB — APTT: aPTT: 32 seconds (ref 24–36)

## 2021-08-15 LAB — MAGNESIUM: Magnesium: 1.8 mg/dL (ref 1.7–2.4)

## 2021-08-15 MED ORDER — SODIUM CHLORIDE 0.9 % IV BOLUS
1000.0000 mL | Freq: Once | INTRAVENOUS | Status: AC
  Administered 2021-08-15: 1000 mL via INTRAVENOUS

## 2021-08-15 MED ORDER — NORTRIPTYLINE HCL 25 MG PO CAPS
100.0000 mg | ORAL_CAPSULE | Freq: Every day | ORAL | Status: DC
  Administered 2021-08-15 – 2021-08-17 (×3): 100 mg via ORAL
  Filled 2021-08-15 (×3): qty 4

## 2021-08-15 MED ORDER — SODIUM CHLORIDE 0.9 % IV SOLN: Freq: Once | INTRAVENOUS | Status: AC

## 2021-08-15 MED ORDER — ALPRAZOLAM 0.5 MG PO TABS
0.5000 mg | ORAL_TABLET | Freq: Two times a day (BID) | ORAL | Status: DC | PRN
  Administered 2021-08-16: 0.5 mg via ORAL
  Filled 2021-08-15: qty 1

## 2021-08-15 MED ORDER — TEMAZEPAM 15 MG PO CAPS
15.0000 mg | ORAL_CAPSULE | Freq: Every day | ORAL | Status: DC
  Administered 2021-08-15 – 2021-08-17 (×3): 15 mg via ORAL
  Filled 2021-08-15 (×3): qty 1

## 2021-08-15 MED ORDER — ENSURE ENLIVE PO LIQD: 237.0000 mL | Freq: Two times a day (BID) | ORAL | Status: DC

## 2021-08-15 MED ORDER — ENSURE ENLIVE PO LIQD
237.0000 mL | Freq: Two times a day (BID) | ORAL | Status: DC
  Administered 2021-08-15 – 2021-08-16 (×2): 237 mL via ORAL

## 2021-08-15 MED ORDER — SODIUM CHLORIDE 0.9 % IV SOLN: INTRAVENOUS | Status: DC

## 2021-08-15 NOTE — ED Provider Notes (Signed)
[facility] Provider Note   CSN: [id number] Arrival date & time: 08/14/21  1509     History Chief Complaint  Patient presents with   Fatigue    [name] is a 70 y.o. male.  HPI     70 y.o. male with medical history significant for malignant neoplasm of right lung, COPD, GERD, right BKA and COVID-19 in August presents to the emergency department after being asked to go to the ED by his PCP because of increased fatigue and increased respiratory and heart rate.  Patient complained of decreased oral intake with subsequent worsening weakness, he followed up with his PCP today and was noted to be tachycardic and tachypneic in the office, so he was asked to go to the ED for further evaluation and for IV hydration due to dehydration.  Reduced po intake over the last 10 days. Voice has changed over the last 2-3 months, but not dysphagia. No fevers, chills. No new cough.   Past Medical History:  Diagnosis Date   Anxiety disorder    Aortic atherosclerosis ([place]) 02/26/2021   Seen on CT scan 2020   Cancer [facility])    Chronic insomnia    Chronic low back pain    COPD (chronic obstructive pulmonary disease) (HCC)    GERD (gastroesophageal reflux disease) 08/29/2017   Hx of right BKA ([place])    Secondary to trauma   Hyperlipidemia     Patient Active Problem List   Diagnosis Date Noted   Protein-calorie malnutrition, severe 08/16/2021   Dehydration 08/15/2021   Lactic acidosis 08/15/2021   Thrombocytopenia ([place]) 08/15/2021   Hyperglycemia 08/15/2021   Transaminitis 08/15/2021   Metastatic lung cancer (metastasis from lung to other site) ([place]) 08/15/2021   Hyponatremia 08/15/2021   Lung mass 08/10/2021   Adenopathy 08/10/2021   Mediastinal adenopathy 07/26/2021   Decreased breath sounds 04/07/2021   Cough productive of purulent sputum 04/07/2021   Bronchial irritation 04/07/2021   Acute bronchitis with COPD ([place]) 04/07/2021   Acute rhinosinusitis 04/07/2021    Aortic atherosclerosis ([place]) 02/26/2021   Encounter for chronic pain management 05/10/2020   Pneumonia due to COVID-19 virus 12/02/2019   COPD mixed type ([place]) 02/02/2018   GERD (gastroesophageal reflux disease) 08/29/2017   Chronic leg pain 12/15/2015   History of left below knee amputation ([place]) 08/31/2015   Hyperlipidemia 02/18/2014   Chronic back pain 07/10/2013   Insomnia 07/10/2013   Generalized anxiety disorder 07/10/2013    History reviewed. No pertinent surgical history.     Family History  Problem Relation Age of Onset   Healthy Mother    Healthy Father     Social History   Tobacco Use   Smoking status: Light Smoker    Packs/day: 1.00    Years: 50.00    Pack years: 50.00    Types: Cigarettes    Start date: 1971   Smokeless tobacco: Never   Tobacco comments:    Currently smoking 1cig a day as of 08/10/21 ep  Substance Use Topics   Alcohol use: No    Home Medications Prior to Admission medications   Medication Sig Start Date End Date Taking? Authorizing Provider  acetaminophen (TYLENOL) 500 MG tablet Take 500 mg by mouth every 6 (six) hours as needed for mild pain, fever or headache.   Yes [provider]  budesonide-formoterol (SYMBICORT) 160-4.5 MCG/ACT inhaler Inhale 2 puffs into the lungs 2 (two) times daily. 11/09/20  Yes [name], MD  dextromethorphan-guaiFENesin ([place] DM)  10-100 MG/5ML liquid Take 5 mLs by mouth every 4 (four) hours as needed for cough.   Yes [provider]  hyoscyamine (LEVSIN SL) 0.125 MG SL tablet PLACE 1 TABLET UNDER THE TONGUE EVERY 6 HOURS AS NEEDED FOR CRAMPS 09/18/20  Yes Luking, Elayne Snare, MD  nortriptyline (PAMELOR) 50 MG capsule Take 2 capsules (100 mg total) by mouth at bedtime. 06/13/21  Yes [name], MD  omeprazole (PRILOSEC) 40 MG capsule Take 1 capsule (40 mg total) by mouth daily. 06/13/21  Yes Luking, Scott A, MD  polyethylene glycol (MIRALAX / GLYCOLAX) 17 g packet Take 17 g by mouth  daily. 08/18/21 09/17/21 Yes Johnson, Clanford L, MD  predniSONE (DELTASONE) 5 MG tablet Take 15 mg by mouth daily with breakfast.   Yes [provider]  temazepam (RESTORIL) 15 MG capsule Take 1 capsule (15 mg total) by mouth at bedtime. 06/13/21  Yes Luking, Elayne Snare, MD  VENTOLIN HFA 108 (90 Base) MCG/ACT inhaler INHALE 2 PUFFS INTO THE LUNGS EVERY 4 HOURS AS NEEDED FOR WHEEZING. Patient taking differently: Inhale 2 puffs into the lungs every 4 (four) hours as needed for shortness of breath. 04/07/20  Yes [name], MD  ALPRAZolam Duanne Moron) 0.5 MG tablet Take 1 tablet (0.5 mg total) by mouth 3 (three) times daily as needed for anxiety. 08/18/21   Johnson, Clanford L, MD  feeding supplement (ENSURE ENLIVE / ENSURE PLUS) LIQD Take 237 mLs by mouth 3 (three) times daily between meals. 08/18/21   Johnson, Clanford L, MD  ipratropium-albuterol (DUONEB) 0.5-2.5 (3) MG/3ML SOLN Take 3 mLs by nebulization every 6 (six) hours. 08/18/21   Johnson, Clanford L, MD  mirtazapine (REMERON SOL-TAB) 15 MG disintegrating tablet Take 0.5 tablets (7.5 mg total) by mouth at bedtime. 08/18/21   Murlean Iba, MD  Multiple Vitamin (MULTIVITAMIN WITH MINERALS) TABS tablet Take 1 tablet by mouth daily. 08/19/21   Johnson, Clanford L, MD  Oxycodone HCl 10 MG TABS Take 1 tablet (10 mg total) by mouth every 6 (six) hours for 3 days. 08/18/21 08/21/21  Murlean Iba, MD  senna (SENOKOT) 8.6 MG TABS tablet Take 1 tablet (8.6 mg total) by mouth daily as needed for mild constipation. 08/18/21   Murlean Iba, MD    Allergies    Asa [aspirin] and Benadryl [diphenhydramine hcl]  Review of Systems   Review of Systems  Constitutional:  Positive for activity change.  Respiratory:  Positive for shortness of breath.   Cardiovascular:  Positive for chest pain.  Gastrointestinal:  Positive for nausea.  Neurological:  Positive for weakness.  Hematological:  Does not bruise/bleed easily.  All other systems  reviewed and are negative.  Physical Exam Updated Vital Signs BP 117/79 (BP Location: Right Arm)   Pulse (!) 103   Temp 97.6 F (36.4 C) (Oral)   Resp 18   Ht 6' (1.829 m)   Wt 70.3 kg   SpO2 95%   BMI 21.02 kg/m   Physical Exam Vitals and nursing note reviewed.  Constitutional:      Appearance: He is well-developed.  HENT:     Head: Atraumatic.  Cardiovascular:     Rate and Rhythm: Tachycardia present.  Pulmonary:     Effort: Pulmonary effort is normal. No respiratory distress.     Breath sounds: No rales.  Musculoskeletal:     Cervical back: Neck supple.  Skin:    General: Skin is warm.  Neurological:     Mental Status: He  is alert and oriented to person, place, and time.    ED Results / Procedures / Treatments   Labs (all labs ordered are listed, but only abnormal results are displayed) Labs Reviewed  CBC WITH DIFFERENTIAL/PLATELET - Abnormal; Notable for the following components:      Result Value   Platelets 56 (*)    nRBC 1.6 (*)    Abs Immature Granulocytes 0.32 (*)    All other components within normal limits  COMPREHENSIVE METABOLIC PANEL - Abnormal; Notable for the following components:   Sodium 131 (*)    Chloride 90 (*)    Glucose, Bld 142 (*)    BUN 28 (*)    Albumin 3.0 (*)    AST 58 (*)    Alkaline Phosphatase 256 (*)    All other components within normal limits  LACTIC ACID, PLASMA - Abnormal; Notable for the following components:   Lactic Acid, Venous 2.1 (*)    All other components within normal limits  LACTIC ACID, PLASMA - Abnormal; Notable for the following components:   Lactic Acid, Venous 2.8 (*)    All other components within normal limits  PHOSPHORUS - Abnormal; Notable for the following components:   Phosphorus 5.0 (*)    All other components within normal limits  COMPREHENSIVE METABOLIC PANEL - Abnormal; Notable for the following components:   Chloride 92 (*)    Glucose, Bld 120 (*)    Calcium 8.7 (*)    Total Protein 6.2  (*)    Albumin 2.6 (*)    AST 54 (*)    Alkaline Phosphatase 213 (*)    All other components within normal limits  CBC - Abnormal; Notable for the following components:   Platelets 51 (*)    nRBC 0.8 (*)    All other components within normal limits  PROTIME-INR - Abnormal; Notable for the following components:   Prothrombin Time 15.3 (*)    All other components within normal limits  COMPREHENSIVE METABOLIC PANEL - Abnormal; Notable for the following components:   Chloride 94 (*)    Glucose, Bld 109 (*)    Creatinine, Ser 0.55 (*)    Total Protein 5.9 (*)    Albumin 2.5 (*)    AST 49 (*)    Alkaline Phosphatase 176 (*)    All other components within normal limits  CBC - Abnormal; Notable for the following components:   RBC 4.21 (*)    Hemoglobin 12.6 (*)    HCT 37.3 (*)    Platelets 43 (*)    nRBC 0.9 (*)    All other components within normal limits  MAGNESIUM - Abnormal; Notable for the following components:   Magnesium 1.6 (*)    All other components within normal limits  COMPREHENSIVE METABOLIC PANEL - Abnormal; Notable for the following components:   Potassium 3.3 (*)    Chloride 93 (*)    Glucose, Bld 107 (*)    Creatinine, Ser 0.60 (*)    Calcium 8.7 (*)    Total Protein 5.8 (*)    Albumin 2.5 (*)    AST 54 (*)    Alkaline Phosphatase 154 (*)    All other components within normal limits  CBC - Abnormal; Notable for the following components:   WBC 3.6 (*)    RBC 3.95 (*)    Hemoglobin 11.9 (*)    HCT 35.0 (*)    Platelets 40 (*)    nRBC 1.1 (*)    All other components  within normal limits  COMPREHENSIVE METABOLIC PANEL - Abnormal; Notable for the following components:   Chloride 92 (*)    Glucose, Bld 132 (*)    Creatinine, Ser 0.58 (*)    Total Protein 6.1 (*)    Albumin 2.6 (*)    AST 72 (*)    Alkaline Phosphatase 172 (*)    All other components within normal limits  CBC - Abnormal; Notable for the following components:   WBC 3.8 (*)    Hemoglobin  12.9 (*)    HCT 37.5 (*)    Platelets 41 (*)    nRBC 1.6 (*)    All other components within normal limits  SARS CORONAVIRUS 2 (TAT 6-24 HRS)  MRSA NEXT GEN BY PCR, NASAL  PROTIME-INR  APTT  MAGNESIUM  TSH  HIV ANTIBODY (ROUTINE TESTING W REFLEX)  APTT  MAGNESIUM  PHOSPHORUS  LACTIC ACID, PLASMA  LACTIC ACID, PLASMA  MAGNESIUM  MAGNESIUM    EKG EKG Interpretation  Date/Time:  Monday August 14 2021 17:47:43 EDT Ventricular Rate:  123 PR Interval:  122 QRS Duration: 89 QT Interval:  296 QTC Calculation: 424 R Axis:   -67 Text Interpretation: Sinus tachycardia Atrial premature complexes Markedly posterior QRS axis No acute changes No significant change since last tracing Confirmed by Varney Biles [phone]) on 08/14/2021 6:19:38 PM  Radiology No results found.  Procedures .Critical Care Performed by: Varney Biles, MD Authorized by: Varney Biles, MD   Critical care provider statement:    Critical care time (minutes):  48   Critical care was necessary to treat or prevent imminent or life-threatening deterioration of the following conditions:  Circulatory failure and respiratory failure   Critical care was time spent personally by me on the following activities:  Discussions with consultants, evaluation of patient's response to treatment, examination of patient, ordering and performing treatments and interventions, ordering and review of laboratory studies, ordering and review of radiographic studies, pulse oximetry, re-evaluation of patient's condition, obtaining history from patient or surrogate and review of old charts   Medications Ordered in ED Medications  0.9 %  sodium chloride infusion ( Intravenous Rate/Dose Change 08/17/21 1200)  temazepam (RESTORIL) capsule 15 mg (15 mg Oral Given 08/17/21 2101)  nortriptyline (PAMELOR) capsule 100 mg (100 mg Oral Given 08/17/21 2101)  multivitamin with minerals tablet 1 tablet (1 tablet Oral Given 08/18/21 0819)  feeding  supplement (ENSURE ENLIVE / ENSURE PLUS) liquid 237 mL (237 mLs Oral Patient Refused/Not Given 08/18/21 1415)  mirtazapine (REMERON SOL-TAB) disintegrating tablet 7.5 mg (7.5 mg Oral Given 08/17/21 2101)  ALPRAZolam (XANAX) tablet 0.5 mg (0.5 mg Oral Given 08/17/21 1807)  senna (SENOKOT) tablet 8.6 mg (has no administration in time range)  ipratropium-albuterol (DUONEB) 0.5-2.5 (3) MG/3ML nebulizer solution 3 mL (3 mLs Nebulization Given 08/18/21 1402)  mometasone-formoterol (DULERA) 200-5 MCG/ACT inhaler 2 puff (2 puffs Inhalation Given 08/18/21 0736)  dextromethorphan-guaiFENesin (ROBITUSSIN-DM) 10-100 MG/5ML liquid 5 mL (has no administration in time range)  hyoscyamine (LEVSIN SL) SL tablet 0.125 mg (0.125 mg Oral Given 08/17/21 1336)  pantoprazole (PROTONIX) EC tablet 40 mg (40 mg Oral Given 08/18/21 0819)  predniSONE (DELTASONE) tablet 15 mg (15 mg Oral Given 08/18/21 0819)  albuterol (VENTOLIN HFA) 108 (90 Base) MCG/ACT inhaler 2 puff (has no administration in time range)  oxyCODONE (Oxy IR/ROXICODONE) immediate release tablet 5-10 mg (10 mg Oral Given 08/18/21 0819)  iohexol (OMNIPAQUE) 350 MG/ML injection 125 mL (100 mLs Intravenous Contrast Given 08/14/21 2346)  sodium chloride 0.9 %  bolus 1,000 mL (0 mLs Intravenous Stopped 08/15/21 0211)  0.9 %  sodium chloride infusion (0 mLs Intravenous Stopped 08/15/21 1109)  magnesium sulfate IVPB 4 g 100 mL (4 g Intravenous New Bag/Given 08/16/21 0914)  potassium chloride SA (KLOR-CON) CR tablet 40 mEq (40 mEq Oral Given 08/17/21 0847)    ED Course  I have reviewed the triage vital signs and the nursing notes.  Pertinent labs & imaging results that were available during my care of the patient were reviewed by me and considered in my medical decision making (see chart for details).    MDM Rules/Calculators/A&P                           70 year old male with pulmonary nodule that he is being worked up for likely cancer, COVID-19 in August comes in with  chief complaint of reduced p.o. intake, increased weakness.  He was seen by PCP where he was noted to be tachycardic and hypoxic.  Patient is reporting bloody phlegm with cough.  Based on our assessment, differential diagnosis includes renal failure, metabolic derangement, symptomatic anemia, long COVID, pulmonary embolism, worsening pulmonary mass, postobstructive pneumonia.  We will start with basic work-up and reassess the patient.   Reassessment: Patient's initial work-up looks quite reassuring.  He has received IV fluid and is still noted to be tachycardic and slightly tachypneic.  No hypoxia.  We will get a CT angio chest PE and CT soft tissue neck.  The latter because patient has hoarse voice and reports that it has been present only for the last 3 months.  Concerns for PE, worsening metastatic lung cancer, neck mass.  Patient's care is being signed out to Dr. Sedonia Small -who will follow up with the patient.  Anticipate admission at this time.  Final Clinical Impression(s) / ED Diagnoses Final diagnoses:  Dehydration    Rx / DC Orders ED Discharge Orders          Ordered    Multiple Vitamin (MULTIVITAMIN WITH MINERALS) TABS tablet  Daily        08/18/21 1112    ALPRAZolam (XANAX) 0.5 MG tablet  3 times daily PRN        08/18/21 1112    Oxycodone HCl 10 MG TABS  Every 6 hours        08/18/21 1112    polyethylene glycol (MIRALAX / GLYCOLAX) 17 g packet  Daily        08/18/21 1112    feeding supplement (ENSURE ENLIVE / ENSURE PLUS) LIQD  3 times daily between meals        08/18/21 1112    ipratropium-albuterol (DUONEB) 0.5-2.5 (3) MG/3ML SOLN  Every 6 hours        08/18/21 1112    mirtazapine (REMERON SOL-TAB) 15 MG disintegrating tablet  Daily at bedtime        08/18/21 1112    senna (SENOKOT) 8.6 MG TABS tablet  Daily PRN        08/18/21 1112             Varney Biles, MD 08/18/21 1445

## 2021-08-15 NOTE — H&P (Signed)
History and Physical  [name] IHK:[id number] DOB: Mar 15, 1951 DOA: 08/14/2021  Referring physician: Maudie Flakes, MD PCP: [name], MD  Patient coming from: Home  Chief Complaint: Weakness, increased respiratory rate and increased heart rate   HPI: [name] is a 70 y.o. male with medical history significant for malignant neoplasm of right lung (follows with Dr. Delton Coombes), COPD, GERD, right BKA and COVID-19 in August who presents to the emergency department accompanied by sister at bedside due to being asked to go to the ED by his PCP because of increased fatigue and increased respiratory and heart rate.  Patient complained of decreased oral intake with subsequent worsening weakness, he followed up with his PCP today and was noted to be tachycardic and tachypneic in the office, so he was asked to go to the ED for further evaluation and for IV hydration due to dehydration.  ED Course:  In the emergency department, he was tachypneic and tachycardic, BP was 129/70 and O2 sat was  92% on room air.  He was reported to suddenly desaturates to 88-89% while sleeping, so was provided with supplemental oxygen via [place] at 2 LPM with improved oxygenation.  Work-up in the ED showed normal CBC except for thrombocytopenia, BMP shows hyponatremia, hyperglycemia.  Lactic acid 2.1 > 2.8 and transaminitis. CT neck with contrast showed hyperenhancing mass adjacent to the left transverse process of C1 with expansion of the adjacent left obliquus capitis inferior muscle, most consistent with metastatic disease. CT angiography chest with contrast showed no evidence of pulmonary embolism but showed bulky right infrahilar/right lower lobe mass, corresponding to the patient's known primary bronchogenic neoplasm, unchanged from recent PET. Chest x-ray showed improved opacities in the right middle lobe.  No new focal airspace disease Patient was provided with IV hydration.  Hospitalist was asked to admit  patient   Review of Systems: Constitutional: Negative for chills and fever.  HENT: Negative for ear pain and sore throat.   Eyes: Negative for pain and visual disturbance.  Respiratory: Positive for hemoptysis, chest tightness and shortness of breath.   Cardiovascular: Negative for chest pain and palpitations.  Gastrointestinal: Negative for abdominal pain and vomiting.  Endocrine: Negative for polyphagia and polyuria.  Genitourinary: Negative for decreased urine volume, dysuria, enuresis Musculoskeletal: Negative for arthralgias and back pain.  Skin: Negative for color change and rash.  Allergic/Immunologic: Negative for immunocompromised state.  Neurological: Positive for weakness.  Negative for tremors, syncope, speech difficulty and headaches.  Hematological: Does not bruise/bleed easily.  All other systems reviewed and are negative   Past Medical History:  Diagnosis Date   Anxiety disorder    Aortic atherosclerosis ([place]) 02/26/2021   Seen on CT scan 2020   Cancer [facility])    Chronic insomnia    Chronic low back pain    COPD (chronic obstructive pulmonary disease) (HCC)    GERD (gastroesophageal reflux disease) 08/29/2017   Hx of right BKA ([place])    Secondary to trauma   Hyperlipidemia    History reviewed. No pertinent surgical history.  Social History:  reports that he has been smoking cigarettes. He started smoking about 51 years ago. He has a 50.00 pack-year smoking history. He has never used smokeless tobacco. He reports that he does not drink alcohol. No history on file for drug use.   Allergies  Allergen Reactions   Asa [Aspirin] Other (See Comments)    Intolerance to aspirin due to ulcer   Benadryl [Diphenhydramine Hcl] Other (See Comments)  Nervousness, insomnia    Family History  Problem Relation Age of Onset   Healthy Mother    Healthy Father       Prior to Admission medications   Medication Sig Start Date End Date Taking? Authorizing Provider   acetaminophen (TYLENOL) 500 MG tablet Take 500 mg by mouth every 6 (six) hours as needed for mild pain, fever or headache.   Yes [provider]  ALPRAZolam (XANAX) 0.5 MG tablet TAKE 1 TABLET BY MOUTH 2 TIMES A DAY. (1 AT 8AM & 1 AT 2PM.) 06/13/21  Yes Luking, Elayne Snare, MD  budesonide-formoterol (SYMBICORT) 160-4.5 MCG/ACT inhaler Inhale 2 puffs into the lungs 2 (two) times daily. 11/09/20  Yes Luking, Elayne Snare, MD  dextromethorphan-guaiFENesin (TUSSIN DM) 10-100 MG/5ML liquid Take 5 mLs by mouth every 4 (four) hours as needed for cough.   Yes [provider]  hyoscyamine (LEVSIN SL) 0.125 MG SL tablet PLACE 1 TABLET UNDER THE TONGUE EVERY 6 HOURS AS NEEDED FOR CRAMPS 09/18/20  Yes Luking, Elayne Snare, MD  nortriptyline (PAMELOR) 50 MG capsule Take 2 capsules (100 mg total) by mouth at bedtime. 06/13/21  Yes [name], MD  omeprazole (PRILOSEC) 40 MG capsule Take 1 capsule (40 mg total) by mouth daily. 06/13/21  Yes [name], MD  Oxycodone HCl 10 MG TABS Take 1 tablet (10 mg total) by mouth every 6 (six) hours. 08/09/21  Yes Derek Jack, MD  predniSONE (DELTASONE) 5 MG tablet Take 15 mg by mouth daily with breakfast.   Yes [provider]  temazepam (RESTORIL) 15 MG capsule Take 1 capsule (15 mg total) by mouth at bedtime. 06/13/21  Yes Luking, Elayne Snare, MD  VENTOLIN HFA 108 (90 Base) MCG/ACT inhaler INHALE 2 PUFFS INTO THE LUNGS EVERY 4 HOURS AS NEEDED FOR WHEEZING. Patient taking differently: Inhale 2 puffs into the lungs every 4 (four) hours as needed for shortness of breath. 04/07/20  Yes Luking, Elayne Snare, MD  ondansetron (ZOFRAN ODT) 8 MG disintegrating tablet Take 1 tablet (8 mg total) by mouth every 8 (eight) hours as needed for nausea or vomiting. Patient not taking: Reported on 08/14/2021 08/14/21   [name], MD  oxyCODONE-acetaminophen (PERCOCET/ROXICET) 5-325 MG tablet 1 tablet every 6 hours for pain hold if drowsy or asleep Patient not taking: No sig  reported 08/08/21   [name], MD    Physical Exam: BP 139/79   Pulse (!) 113   Temp 98.4 F (36.9 C) (Oral)   Resp (!) 27   Ht 6' (1.829 m)   Wt 70.3 kg   SpO2 100%   BMI 21.02 kg/m   General: 70 y.o. year-old male well developed well nourished in no acute distress.  Alert and oriented x3. HEENT: Dry mucous membrane.  NCAT, EOMI Neck: Supple, trachea medial Cardiovascular: Tachycardia regular rate and rhythm with no rubs or gallops.  Respiratory: Tachypnea.  Clear to auscultation with no wheezes or rales.  Abdomen: Soft, nontender nondistended with normal bowel sounds x4 quadrants. Muskuloskeletal: No cyanosis, clubbing or edema noted bilaterally Neuro: CN II-XII intact, strength 5/5 x 4, sensation, reflexes intact Skin: No ulcerative lesions noted or rashes Psychiatry: Mood is appropriate for condition and setting          Labs on Admission:  Basic Metabolic Panel: Recent Labs  Lab 08/09/21 1116 08/14/21 1741  NA 134* 131*  K 4.1 4.5  CL 91* 90*  CO2 30 29  GLUCOSE 191* 142*  BUN 21  28*  CREATININE 0.87 0.93  CALCIUM 9.8 9.6  MG 2.1 2.1  PHOS  --  5.0*   Liver Function Tests: Recent Labs  Lab 08/09/21 1116 08/14/21 1741  AST 70* 58*  ALT 42 34  ALKPHOS 280* 256*  BILITOT 1.0 0.9  PROT 8.2* 7.1  ALBUMIN 3.5 3.0*   No results for input(s): LIPASE, AMYLASE in the last 168 hours. No results for input(s): AMMONIA in the last 168 hours. CBC: Recent Labs  Lab 08/09/21 1116 08/14/21 1741  WBC 12.1* 5.6  NEUTROABS 8.4* 3.0  HGB 17.1* 15.0  HCT 50.0 44.1  MCV 89.6 89.3  PLT 83* 56*   Cardiac Enzymes: No results for input(s): CKTOTAL, CKMB, CKMBINDEX, TROPONINI in the last 168 hours.  BNP (last 3 results) No results for input(s): BNP in the last 8760 hours.  ProBNP (last 3 results) No results for input(s): PROBNP in the last 8760 hours.  CBG: No results for input(s): GLUCAP in the last 168 hours.  Radiological Exams on Admission: CT Soft  Tissue Neck W Contrast  Result Date: 08/15/2021 CLINICAL DATA:  Soft tissue mass of the neck EXAM: CT NECK WITH CONTRAST TECHNIQUE: Multidetector CT imaging of the neck was performed using the standard protocol following the bolus administration of intravenous contrast. CONTRAST:  148mL OMNIPAQUE IOHEXOL 350 MG/ML SOLN COMPARISON:  None. FINDINGS: PHARYNX AND LARYNX: The nasopharynx, oropharynx and larynx are normal. Visible portions of the oral cavity, tongue base and floor of mouth are normal. Normal epiglottis, vallecula and pyriform sinuses. The larynx is normal. No retropharyngeal abscess, effusion or lymphadenopathy. SALIVARY GLANDS: Normal parotid, submandibular and sublingual glands. THYROID: Normal. LYMPH NODES: Hyperenhancing mass adjacent to the left transverse process of C1 measuring 3.0 x 1.5 cm. There is expansion of the adjacent left obliquus capitis inferior muscle (3:31). The mass likely extends into the left C1 transverse foramen. VASCULAR: Major cervical vessels are patent. LIMITED INTRACRANIAL: Normal. VISUALIZED ORBITS: Normal. MASTOIDS AND VISUALIZED PARANASAL SINUSES: No fluid levels or advanced mucosal thickening. No mastoid effusion. SKELETON: No bony spinal canal stenosis. No lytic or blastic lesions. UPPER CHEST: Partially necrotic mass of the upper mediastinum measures approximately 3.4 x 3.5 cm and is better characterized on concomitant CTA chest. There is emphysema. OTHER: None. IMPRESSION: 1. Hyperenhancing mass adjacent to the left transverse process of C1 with expansion of the adjacent left obliquus capitis inferior muscle, most consistent with metastatic disease. The mass likely extends into the left C1 transverse foramen but the left vertebral artery remains patent. 2. Partially necrotic mass of the upper mediastinum, better characterized on concomitant CTA chest. Emphysema (ICD10-J43.9). Electronically Signed   By: Ulyses Jarred M.D.   On: 08/15/2021 00:46   CT Angio Chest PE  W and/or Wo Contrast  Result Date: 08/15/2021 CLINICAL DATA:  Tachycardia, weakness, dehydration.  Lung cancer. EXAM: CT ANGIOGRAPHY CHEST WITH CONTRAST TECHNIQUE: Multidetector CT imaging of the chest was performed using the standard protocol during bolus administration of intravenous contrast. Multiplanar CT image reconstructions and MIPs were obtained to evaluate the vascular anatomy. CONTRAST:  134mL OMNIPAQUE IOHEXOL 350 MG/ML SOLN COMPARISON:  PET-CT dated 08/03/2021 FINDINGS: Cardiovascular: Satisfactory opacification the bilateral pulmonary arteries to the segmental level. No evidence of pulmonary embolism. Although not tailored for evaluation of the thoracic aorta, there is no evidence of thoracic aortic aneurysm or dissection. Heart is normal in size.  No pericardial effusion. Coronary atherosclerosis of the LAD and right coronary artery. Mediastinum/Nodes: Bulky mediastinal lymphadenopathy, unchanged from recent PET. Dominant  right paratracheal node measures 3.1 cm short axis (series 5/image 28). Visualized thyroid is unremarkable. Lungs/Pleura: Bulky right infrahilar/right lower lobe mass narrowing the right lower lobe bronchus, measuring 4.1 x 6.4 cm (series 5/image 46), unchanged from recent PET. Mild postobstructive opacity in the right lower lobe (series 7/image 102). Moderate centrilobular and paraseptal emphysematous changes, upper lung predominant. No pleural effusion or pneumothorax. Upper Abdomen: Visualized upper abdomen is notable for a small hiatal hernia. Musculoskeletal: Multifocal lytic metastases, including the right sternum (series 5/image 12) and the left T7 transverse process (series 5/image 40). Additional vertebral body lesions are better evaluated on recent PET. Review of the MIP images confirms the above findings. IMPRESSION: No evidence of pulmonary embolism. Bulky right infrahilar/right lower lobe mass, corresponding to the patient's known primary bronchogenic neoplasm,  unchanged from recent PET. Bulky mediastinal lymphadenopathy. Multifocal osseous metastases, better evaluated on recent PET. Aortic Atherosclerosis (ICD10-I70.0) and Emphysema (ICD10-J43.9). Electronically Signed   By: Julian Hy M.D.   On: 08/15/2021 00:36   DG Chest Port 1 View  Result Date: 08/14/2021 CLINICAL DATA:  Hypoxia, shortness of breath, recent COVID. Known lung cancer EXAM: PORTABLE CHEST 1 VIEW COMPARISON:  Chest radiograph 08/09/2021 FINDINGS: The cardiomediastinal silhouette is stable, allowing for significant rightward patient rotation. Increased interstitial markings seen throughout both lungs are grossly similar. Opacity in the right lower lobe has improved. There is no new focal airspace disease. There is no pleural effusion. There is no pneumothorax. The known right hilar mass and mediastinal lymphadenopathy are not well evaluated partially due to patient rotation. There is no acute osseous abnormality. IMPRESSION: 1. Improved opacities in the right middle lobe. No new focal airspace disease. 2. The known right hilar mass and associated lymphadenopathy are not well evaluated. Electronically Signed   By: Valetta Mole M.D.   On: 08/14/2021 17:30    EKG: I independently viewed the EKG done and my findings are as followed: Sinus tachycardia at a rate of 124 bpm with APCs  Assessment/Plan Present on Admission:  Dehydration  Active Problems:   Dehydration   Lactic acidosis   Thrombocytopenia (HCC)   Hyperglycemia   Transaminitis   Metastatic lung cancer (metastasis from lung to other site) (HCC)   Hyponatremia  Acute dehydration Continue IV hydration  Lactic acidosis possibly secondary to above Lactic acid 2.1 > 2.8, continue to trend lactic acid  Hyponatremia possibly due to dehydration Na 131, continue IV hydration  Hyperglycemia possibly secondary to a reactive process Blood glucose 142, continue to monitor to glucose level with morning labs  Transaminitis  possibly due to shock liver AST 58, ALP 256.  These were (70 and 280 respectively) Continue to monitor liver enzymes  Metastatic lung cancer Patient follows with Dr. Delton Coombes He has a pending appointment with Dr. Valeta Harms possibly for biopsy  Thrombocytopenia Platelets 44, ordered and location I will please use, patient denies any bleeding episode Continue to monitor platelet levels with morning labs  DVT prophylaxis: SCD  Code Status: Full code  Family Communication: Sister at bedside (all questions answered to satisfaction)  Disposition Plan:  Patient is from:                        home Anticipated DC to:                   SNF or family members home Anticipated DC date:               2-3  days Anticipated DC barriers:         Patient requires inpatient admission due to dehydration requiring IV fluid  Consults called: None  Admission status: Observation    Bernadette Hoit MD [facility] 08/15/2021, 3:55 AM

## 2021-08-15 NOTE — ED Provider Notes (Signed)
  Provider Note MRN:  [id number]  Arrival date & time: 08/15/21    ED Course and Medical Decision Making  Assumed care from Dr. Kathrynn Humble at shift change.  Tachypnea, tachycardia, known lung mass, awaiting CTA.  Anticipating admission.  No PE on CTA, continued tachycardia, admitted to hospital service for dehydration.  Procedures  Final Clinical Impressions(s) / ED Diagnoses     ICD-10-CM   1. Dehydration  E86.0       ED Discharge Orders     None       Discharge Instructions   None     Barth Kirks. [place] Small, [place] [email or web address]    Maudie Flakes, MD 08/15/21 [phone]

## 2021-08-15 NOTE — Telephone Encounter (Signed)
Patient is still in ER waiting ,[name] (sister) would like results of recent test.

## 2021-08-15 NOTE — ED Notes (Signed)
Pt de-states from 96% on room air to 88-89% while sleeping. Pt returned to 2 L of nasal cannula. Wife remains at bedside. Warm blankets provided. Pt reconnected to monitor system

## 2021-08-15 NOTE — Progress Notes (Signed)
[name] is a 70 y.o. male with medical history significant for malignant neoplasm of right lung (follows with Dr. Delton Coombes), COPD, GERD, right BKA and COVID-19 in August who presents to the emergency department accompanied by sister at bedside due to being asked to go to the ED by his PCP because of increased fatigue and increased respiratory and heart rate.  Patient has been admitted after midnight and has been seen and evaluated at bedside with sister present is the primary historian.  He has been noted to have poor oral intake of solids and liquids over the last 2-3 weeks and appears to be quite dehydrated.  He continues to remain on IV fluid and is noted to have alkaline phosphatase elevation for which right upper quadrant ultrasound has been ordered for further evaluation.  Continue to follow-up repeat labs.  Palliative consultation ordered to evaluate potential failure to thrive and reassess goals of care.  Follow-up a.m. labs.  Total care time: 25 minutes.

## 2021-08-15 NOTE — Telephone Encounter (Signed)
Pt sister [name] contacted. Sister states that pt is still in ER received fluids. Please advise. Thank you

## 2021-08-16 ENCOUNTER — Telehealth: Payer: Self-pay | Admitting: Family Medicine

## 2021-08-16 DIAGNOSIS — Z66 Do not resuscitate: Secondary | ICD-10-CM | POA: Diagnosis present

## 2021-08-16 DIAGNOSIS — E860 Dehydration: Secondary | ICD-10-CM | POA: Diagnosis present

## 2021-08-16 DIAGNOSIS — R739 Hyperglycemia, unspecified: Secondary | ICD-10-CM | POA: Diagnosis present

## 2021-08-16 DIAGNOSIS — E43 Unspecified severe protein-calorie malnutrition: Secondary | ICD-10-CM

## 2021-08-16 DIAGNOSIS — E785 Hyperlipidemia, unspecified: Secondary | ICD-10-CM | POA: Diagnosis present

## 2021-08-16 DIAGNOSIS — Z515 Encounter for palliative care: Secondary | ICD-10-CM | POA: Diagnosis not present

## 2021-08-16 DIAGNOSIS — R1312 Dysphagia, oropharyngeal phase: Secondary | ICD-10-CM | POA: Diagnosis present

## 2021-08-16 DIAGNOSIS — Z7189 Other specified counseling: Secondary | ICD-10-CM

## 2021-08-16 DIAGNOSIS — R7401 Elevation of levels of liver transaminase levels: Secondary | ICD-10-CM | POA: Diagnosis not present

## 2021-08-16 DIAGNOSIS — J449 Chronic obstructive pulmonary disease, unspecified: Secondary | ICD-10-CM | POA: Diagnosis present

## 2021-08-16 DIAGNOSIS — E871 Hypo-osmolality and hyponatremia: Secondary | ICD-10-CM | POA: Diagnosis present

## 2021-08-16 DIAGNOSIS — C3491 Malignant neoplasm of unspecified part of right bronchus or lung: Secondary | ICD-10-CM | POA: Diagnosis present

## 2021-08-16 DIAGNOSIS — R1310 Dysphagia, unspecified: Secondary | ICD-10-CM | POA: Diagnosis present

## 2021-08-16 DIAGNOSIS — D630 Anemia in neoplastic disease: Secondary | ICD-10-CM | POA: Diagnosis present

## 2021-08-16 DIAGNOSIS — Z7952 Long term (current) use of systemic steroids: Secondary | ICD-10-CM | POA: Diagnosis not present

## 2021-08-16 DIAGNOSIS — K219 Gastro-esophageal reflux disease without esophagitis: Secondary | ICD-10-CM | POA: Diagnosis present

## 2021-08-16 DIAGNOSIS — E872 Acidosis: Secondary | ICD-10-CM | POA: Diagnosis present

## 2021-08-16 DIAGNOSIS — D6959 Other secondary thrombocytopenia: Secondary | ICD-10-CM | POA: Diagnosis present

## 2021-08-16 DIAGNOSIS — Z8616 Personal history of COVID-19: Secondary | ICD-10-CM | POA: Diagnosis not present

## 2021-08-16 DIAGNOSIS — C3490 Malignant neoplasm of unspecified part of unspecified bronchus or lung: Secondary | ICD-10-CM | POA: Diagnosis present

## 2021-08-16 DIAGNOSIS — F17210 Nicotine dependence, cigarettes, uncomplicated: Secondary | ICD-10-CM | POA: Diagnosis present

## 2021-08-16 DIAGNOSIS — Z6821 Body mass index (BMI) 21.0-21.9, adult: Secondary | ICD-10-CM | POA: Diagnosis not present

## 2021-08-16 DIAGNOSIS — R042 Hemoptysis: Secondary | ICD-10-CM | POA: Diagnosis present

## 2021-08-16 DIAGNOSIS — G4700 Insomnia, unspecified: Secondary | ICD-10-CM | POA: Diagnosis present

## 2021-08-16 DIAGNOSIS — Z79899 Other long term (current) drug therapy: Secondary | ICD-10-CM | POA: Diagnosis not present

## 2021-08-16 DIAGNOSIS — R7402 Elevation of levels of lactic acid dehydrogenase (LDH): Secondary | ICD-10-CM | POA: Diagnosis present

## 2021-08-16 DIAGNOSIS — D696 Thrombocytopenia, unspecified: Secondary | ICD-10-CM | POA: Diagnosis present

## 2021-08-16 DIAGNOSIS — C7931 Secondary malignant neoplasm of brain: Secondary | ICD-10-CM | POA: Diagnosis present

## 2021-08-16 DIAGNOSIS — Z89511 Acquired absence of right leg below knee: Secondary | ICD-10-CM | POA: Diagnosis not present

## 2021-08-16 DIAGNOSIS — F419 Anxiety disorder, unspecified: Secondary | ICD-10-CM | POA: Diagnosis present

## 2021-08-16 DIAGNOSIS — R2689 Other abnormalities of gait and mobility: Secondary | ICD-10-CM | POA: Diagnosis present

## 2021-08-16 DIAGNOSIS — M6281 Muscle weakness (generalized): Secondary | ICD-10-CM | POA: Diagnosis present

## 2021-08-16 LAB — CBC
HCT: 37.3 % — ABNORMAL LOW (ref 39.0–52.0)
Hemoglobin: 12.6 g/dL — ABNORMAL LOW (ref 13.0–17.0)
MCH: 29.9 pg (ref 26.0–34.0)
MCHC: 33.8 g/dL (ref 30.0–36.0)
MCV: 88.6 fL (ref 80.0–100.0)
Platelets: 43 10*3/uL — ABNORMAL LOW (ref 150–400)
RBC: 4.21 MIL/uL — ABNORMAL LOW (ref 4.22–5.81)
RDW: 12.1 % (ref 11.5–15.5)
WBC: 4.2 10*3/uL (ref 4.0–10.5)
nRBC: 0.9 % — ABNORMAL HIGH (ref 0.0–0.2)

## 2021-08-16 LAB — COMPREHENSIVE METABOLIC PANEL
ALT: 28 U/L (ref 0–44)
AST: 49 U/L — ABNORMAL HIGH (ref 15–41)
Albumin: 2.5 g/dL — ABNORMAL LOW (ref 3.5–5.0)
Alkaline Phosphatase: 176 U/L — ABNORMAL HIGH (ref 38–126)
Anion gap: 12 (ref 5–15)
BUN: 11 mg/dL (ref 8–23)
CO2: 31 mmol/L (ref 22–32)
Calcium: 9 mg/dL (ref 8.9–10.3)
Chloride: 94 mmol/L — ABNORMAL LOW (ref 98–111)
Creatinine, Ser: 0.55 mg/dL — ABNORMAL LOW (ref 0.61–1.24)
GFR, Estimated: >60 mL/min (ref >60)
Glucose, Bld: 109 mg/dL — ABNORMAL HIGH (ref 70–99)
Potassium: 3.5 mmol/L (ref 3.5–5.1)
Sodium: 137 mmol/L (ref 135–145)
Total Bilirubin: 0.9 mg/dL (ref 0.3–1.2)
Total Protein: 5.9 g/dL — ABNORMAL LOW (ref 6.5–8.1)

## 2021-08-16 LAB — MAGNESIUM: Magnesium: 1.6 mg/dL — ABNORMAL LOW (ref 1.7–2.4)

## 2021-08-16 MED ORDER — MAGNESIUM SULFATE 4 GM/100ML IV SOLN
4.0000 g | Freq: Once | INTRAVENOUS | Status: AC
  Administered 2021-08-16: 4 g via INTRAVENOUS
  Filled 2021-08-16: qty 100

## 2021-08-16 MED ORDER — ENSURE ENLIVE PO LIQD
237.0000 mL | Freq: Three times a day (TID) | ORAL | Status: DC
  Administered 2021-08-17 – 2021-08-18 (×3): 237 mL via ORAL

## 2021-08-16 MED ORDER — SENNA 8.6 MG PO TABS: 1.0000 | ORAL_TABLET | Freq: Every day | ORAL | Status: DC | PRN

## 2021-08-16 MED ORDER — MIRTAZAPINE 15 MG PO TBDP
7.5000 mg | ORAL_TABLET | Freq: Every day | ORAL | Status: DC
  Administered 2021-08-16 – 2021-08-17 (×2): 7.5 mg via ORAL
  Filled 2021-08-16 (×5): qty 0.5

## 2021-08-16 MED ORDER — ADULT MULTIVITAMIN W/MINERALS CH
1.0000 | ORAL_TABLET | Freq: Every day | ORAL | Status: DC
  Administered 2021-08-16 – 2021-08-18 (×3): 1 via ORAL
  Filled 2021-08-16 (×2): qty 1

## 2021-08-16 MED ORDER — ALPRAZOLAM 0.5 MG PO TABS
0.5000 mg | ORAL_TABLET | Freq: Three times a day (TID) | ORAL | Status: DC | PRN
  Administered 2021-08-16 – 2021-08-17 (×3): 0.5 mg via ORAL
  Filled 2021-08-16 (×5): qty 1

## 2021-08-16 MED ORDER — OXYCODONE HCL 5 MG PO TABS
5.0000 mg | ORAL_TABLET | ORAL | Status: DC | PRN
  Administered 2021-08-16 – 2021-08-17 (×4): 5 mg via ORAL
  Filled 2021-08-16 (×4): qty 1

## 2021-08-16 NOTE — Evaluation (Signed)
Physical Therapy Evaluation Patient Details Name: [name] MRN: [id number] DOB: 02-Sep-1951 Today's Date: 08/16/2021  History of Present Illness  [name] is a 70 y.o. male sent to ED from PCP due to increased fatigue, increased respiratory and heart rate. In ED, pt was tachypneic and tachycardic. CT chest no PE and known primary bronchogenic neoplasm noted, unchanged from recent PET. CT neck with contrast showed hyperenhancing mass adjacent to the left transverse process of C1 with expansion of the adjacent left obliquus capitis inferior muscle, most consistent with metastatic disease. PMH: anxiety, lung cancer, LBP, COPD, R BKA due to trauma  Clinical Impression  Pt admitted with above diagnosis. Pt from [facility], states facility assists him with transfers as needed, he ambulates with R prosthesis and no DME, denies recent falls, facility provides meals and laundry. Pt currently fatigued and in pain, reports mobilized with OT earlier and not wanting to get up with PT. Pt able to sit EOB and don R prosthesis, states it is ill-fit and declines to stand up; states last ambulated last week and has lost weight since then so prosthesis no longer fits. Recommend return to ALF with them assisting and HHPT to return to baseline, pt states they are able to assist him currently. Pt currently with functional limitations due to the deficits listed below (see PT Problem List). Pt will benefit from skilled PT to increase their independence and safety with mobility to allow discharge to the venue listed below.          Recommendations for follow up therapy are one component of a multi-disciplinary discharge planning process, led by the attending physician.  Recommendations may be updated based on patient status, additional functional criteria and insurance authorization.  Follow Up Recommendations Supervision/Assistance - 24 hour;Home health PT (return to ALF)    Equipment Recommendations  Other  (comment) (TBD)    Recommendations for Other Services       Precautions / Restrictions Precautions Precautions: Fall Restrictions Weight Bearing Restrictions: No      Mobility  Bed Mobility Overal bed mobility: Modified Independent  General bed mobility comments: with HOB raised    Transfers  General transfer comment: pt sat EOB and donned R prosthesis, declines to stand due to ill-fit, completed partial stand with OT earlier and now in pain and wanting to rest  Ambulation/Gait                Stairs            Wheelchair Mobility    Modified Rankin (Stroke Patients Only)       Balance Overall balance assessment: Needs assistance Sitting-balance support: No upper extremity supported;Feet supported Sitting balance-Leahy Scale: Good Sitting balance - Comments: seated EOB   Standing balance support: Bilateral upper extremity supported;During functional activity Standing balance-Leahy Scale: Poor Standing balance comment: using RW       Pertinent Vitals/Pain Pain Assessment: 0-10 Pain Score: 8  Pain Location: "mostly head and shoulders but kinda everywhere" Pain Descriptors / Indicators: Other (Comment) ("just tires me out") Pain Intervention(s): Limited activity within patient's tolerance;Monitored during session    [place] expects to be discharged to:: Assisted living [facility])  Additional Comments: Pt reports staff would be available to assist with transfers "most of the time."    Prior Function Level of Independence: Needs assistance   Gait / Transfers Assistance Needed: Pt reports household mobility using prosthetic for R LE. Pt reports he last ambulated ~7 days ago  due to weight loss and ill-fit prosthesis.  ADL's / Homemaking Assistance Needed: Pt reports indpendence for ADL's, staff completes cooking, cleaning, laundry  Comments: Pt reports sleeping in hospital bed, has rollator and SPC that he doesn't use.      Hand Dominance   Dominant Hand: Right    Extremity/Trunk Assessment   Upper Extremity Assessment Upper Extremity Assessment: Defer to OT evaluation    Lower Extremity Assessment Lower Extremity Assessment: Generalized weakness;RLE deficits/detail RLE Deficits / Details: R BKA, hip and knee AROM WNL, reports pain in hip limiting mobility LLE Deficits / Details: AROM WNL throughout, reports pain in hip limiting mobility    Cervical / Trunk Assessment Cervical / Trunk Assessment: Normal  Communication   Communication: No difficulties  Cognition Arousal/Alertness: Awake/alert Behavior During Therapy: WFL for tasks assessed/performed Overall Cognitive Status: Within Functional Limits for tasks assessed     General Comments General comments (skin integrity, edema, etc.): HR 110-120 during eval    Exercises     Assessment/Plan    PT Assessment Patient needs continued PT services  PT Problem List Decreased strength;Decreased activity tolerance;Decreased balance;Cardiopulmonary status limiting activity;Pain       PT Treatment Interventions DME instruction;Gait training;Functional mobility training;Therapeutic activities;Therapeutic exercise;Balance training;Patient/family education    PT Goals (Current goals can be found in the Care Plan section)  Acute Rehab PT Goals Patient Stated Goal: "I'm going back to [employer]" PT Goal Formulation: With patient Time For Goal Achievement: 08/30/21 Potential to Achieve Goals: Good    Frequency Min 3X/week   Barriers to discharge        Co-evaluation               AM-PAC PT "6 Clicks" Mobility  Outcome Measure Help needed turning from your back to your side while in a flat bed without using bedrails?: None Help needed moving from lying on your back to sitting on the side of a flat bed without using bedrails?: None Help needed moving to and from a bed to a chair (including a wheelchair)?: A Lot Help needed standing  up from a chair using your arms (e.g., wheelchair or bedside chair)?: A Lot Help needed to walk in hospital room?: A Lot Help needed climbing 3-5 steps with a railing? : Total 6 Click Score: 15    End of Session Equipment Utilized During Treatment:  (R prosthesis) Activity Tolerance: Patient limited by pain Patient left: in bed;with call bell/phone within reach;with bed alarm set Nurse Communication: Mobility status PT Visit Diagnosis: Other abnormalities of gait and mobility (R26.89);Muscle weakness (generalized) (M62.81)    Time: 9767-3419 PT Time Calculation (min) (ACUTE ONLY): 17 min   Charges:   PT Evaluation $PT Eval Low Complexity: 1 Low           Tori Latresha Yahr PT, DPT 08/16/21, 1:55 PM

## 2021-08-16 NOTE — Plan of Care (Signed)
  Problem: Acute Rehab OT Goals (only OT should resolve) Goal: Pt. Will Perform Grooming Flowsheets (Taken 08/16/2021 1210) Pt Will Perform Grooming:  with min assist  standing  with adaptive equipment Goal: Pt. Will Transfer To Toilet Flowsheets (Taken 08/16/2021 1210) Pt Will Transfer to Toilet:  with min guard assist  squat pivot transfer  bedside commode Goal: Pt/Caregiver Will Perform Home Exercise Program Flowsheets (Taken 08/16/2021 1210) Pt/caregiver will Perform Home Exercise Program:  Increased strength  Both right and left upper extremity  Independently  Debbrah Sampedro OT, MOT

## 2021-08-16 NOTE — NC FL2 (Signed)
[place] MEDICAID FL2 LEVEL OF CARE SCREENING TOOL     IDENTIFICATION  Patient Name: [name] Birthdate: December 15, 1950 Sex: male Admission Date (Current Location): 08/14/2021  [employer] and [place] Number:  [place] [id number] [place] and Address:  [facility] [address] [place]      Provider Number: [id number]  Attending Physician Name and Address:  Murlean Iba, MD  Relative Name and Phone Number:  [name] (Sister)   [phone]    Current Level of Care: Hospital (OBSERVATION) Recommended Level of Care: [place] Prior Approval Number:    Date Approved/Denied:   PASRR Number: [id number]  Discharge Plan: SNF    Current Diagnoses: Patient Active Problem List   Diagnosis Date Noted   Protein-calorie malnutrition, severe 08/16/2021   Dehydration 08/15/2021   Lactic acidosis 08/15/2021   Thrombocytopenia ([place]) 08/15/2021   Hyperglycemia 08/15/2021   Transaminitis 08/15/2021   Metastatic lung cancer (metastasis from lung to other site) ([place]) 08/15/2021   Hyponatremia 08/15/2021   Lung mass 08/10/2021   Adenopathy 08/10/2021   Mediastinal adenopathy 07/26/2021   Decreased breath sounds 04/07/2021   Cough productive of purulent sputum 04/07/2021   Bronchial irritation 04/07/2021   Acute bronchitis with COPD ([place]) 04/07/2021   Acute rhinosinusitis 04/07/2021   Aortic atherosclerosis ([place]) 02/26/2021   Encounter for chronic pain management 05/10/2020   Pneumonia due to COVID-19 virus 12/02/2019   COPD mixed type ([place]) 02/02/2018   GERD (gastroesophageal reflux disease) 08/29/2017   Chronic leg pain 12/15/2015   History of left below knee amputation ([place]) 08/31/2015   Hyperlipidemia 02/18/2014   Chronic back pain 07/10/2013   Insomnia 07/10/2013   Generalized anxiety disorder 07/10/2013    Orientation RESPIRATION BLADDER Height & Weight     Self, Time, Situation, Place  Normal Incontinent Weight:  155 lb (70.3 kg) Height:  6' (182.9 cm)  BEHAVIORAL SYMPTOMS/MOOD NEUROLOGICAL BOWEL NUTRITION STATUS      Incontinent Diet (heart healthy)  AMBULATORY STATUS COMMUNICATION OF NEEDS Skin   Extensive Assist Verbally Normal                       Personal Care Assistance Level of Assistance  Bathing, Feeding, Dressing Bathing Assistance: Limited assistance Feeding assistance: Independent Dressing Assistance: Limited assistance     Functional Limitations Info  Sight, Hearing, Speech Sight Info: Adequate Hearing Info: Adequate Speech Info: Adequate    SPECIAL CARE FACTORS FREQUENCY  PT (By licensed PT)     PT Frequency: 5x/week              Contractures Contractures Info: Not present    Additional Factors Info  Code Status, Allergies, Psychotropic Code Status Info: DNR Allergies Info: Asa, Benadryl Psychotropic Info: Xanax         Current Medications (08/16/2021):  This is the current hospital active medication list Current Facility-Administered Medications  Medication Dose Route Frequency Provider Last Rate Last Admin   0.9 %  sodium chloride infusion   Intravenous Continuous Johnson, Clanford L, MD 75 mL/hr at 08/16/21 0300 Restarted at 08/16/21 0300   ALPRAZolam (XANAX) tablet 0.5 mg  0.5 mg Oral TID PRN Pershing Proud, NP       feeding supplement (ENSURE ENLIVE / ENSURE PLUS) liquid 237 mL  237 mL Oral TID BM Johnson, Clanford L, MD       mirtazapine (REMERON SOL-TAB) disintegrating tablet 7.5 mg  7.5 mg Oral QHS Pershing Proud, NP  multivitamin with minerals tablet 1 tablet  1 tablet Oral Daily Johnson, Clanford L, MD   1 tablet at 08/16/21 1422   nortriptyline (PAMELOR) capsule 100 mg  100 mg Oral QHS Adefeso, Oladapo, DO   100 mg at 08/15/21 2028   oxyCODONE (Oxy IR/ROXICODONE) immediate release tablet 5 mg  5 mg Oral U7G PRN Pershing Proud, NP   5 mg at 08/16/21 1421   senna (SENOKOT) tablet 8.6 mg  1 tablet Oral Daily PRN Pershing Proud, NP        temazepam (RESTORIL) capsule 15 mg  15 mg Oral QHS Adefeso, Oladapo, DO   15 mg at 08/15/21 2028     Discharge Medications: Please see discharge summary for a list of discharge medications.  Relevant Imaging Results:  Relevant Lab Results:   Additional Information SSN [id number], Clydene Pugh, [place]

## 2021-08-16 NOTE — Telephone Encounter (Signed)
I did have a conversation with the sister.  I am very concerned that [name] appears to be going downhill quickly and may well not be able to go through treatment for his underlying cancer.  He is currently in the hospital.  Palliative care is consulting on him which I believe is appropriate.  We will give close follow-up upon discharge.

## 2021-08-16 NOTE — Telephone Encounter (Signed)
Patient needing orders for skilled nursing two times a week for 3 weeks. [place] [phone]

## 2021-08-16 NOTE — TOC Initial Note (Addendum)
Transition of Care [facility]) - Initial/Assessment Note    Patient Details  Name: [name] MRN: [id number] Date of Birth: March 30, 1951  [facility]) CM/SW Contact:    Ihor Gully, LCSW Phone Number: 08/16/2021, 3:38 PM  Clinical Narrative:                 Patient from [facility]. For the past week he has been in a weakened stated and using a w/c. Prior to last week, patient ambulated independently, was independent, and put his prosthetic on independently.  PT recommendations discussed with sister, [name], and SNF was requested.  Patient referred to county SNFs.  Per Tammy at [facility], patient has had both COVID vaccinations.  Expected Discharge Plan: Skilled Nursing Facility Barriers to Discharge: Continued Medical Work up   Patient Goals and CMS Choice     Choice offered to / list presented to : Sibling  Expected Discharge Plan and Services Expected Discharge Plan: [place] Acute Care Choice: [place] Living arrangements for the past 2 months: [place]                                      Prior Living Arrangements/Services Living arrangements for the past 2 months: [place] Lives with:: Facility Resident Patient language and need for interpreter reviewed:: Yes Do you feel safe going back to the place where you live?: Yes      Need for Family Participation in Patient Care: Yes (Comment) Care giver support system in place?: Yes (comment)   Criminal Activity/Legal Involvement Pertinent to Current Situation/Hospitalization: No - Comment as needed  Activities of Daily Living Home Assistive Devices/Equipment: Environmental consultant (specify type), Wheelchair ADL Screening (condition at time of admission) Patient's cognitive ability adequate to safely complete daily activities?: Yes Is the patient deaf or have difficulty hearing?: No Does the patient have difficulty seeing, even when wearing  glasses/contacts?: No Does the patient have difficulty concentrating, remembering, or making decisions?: No Patient able to express need for assistance with ADLs?: Yes Does the patient have difficulty dressing or bathing?: No Independently performs ADLs?: Yes (appropriate for developmental age) Does the patient have difficulty walking or climbing stairs?: No Weakness of Legs: Both Weakness of Arms/Hands: Both  Permission Sought/Granted Permission sought to share information with : Family Supports, Customer service manager    Share Information with NAME: Tammy from [place] and patient's sister, [name]           Emotional Assessment         Alcohol / Substance Use: Not Applicable Psych Involvement: No (comment)  Admission diagnosis:  Dehydration [E86.0] Abdominal pain [R10.9] Patient Active Problem List   Diagnosis Date Noted   Protein-calorie malnutrition, severe 08/16/2021   Dehydration 08/15/2021   Lactic acidosis 08/15/2021   Thrombocytopenia ([place]) 08/15/2021   Hyperglycemia 08/15/2021   Transaminitis 08/15/2021   Metastatic lung cancer (metastasis from lung to other site) ([place]) 08/15/2021   Hyponatremia 08/15/2021   Lung mass 08/10/2021   Adenopathy 08/10/2021   Mediastinal adenopathy 07/26/2021   Decreased breath sounds 04/07/2021   Cough productive of purulent sputum 04/07/2021   Bronchial irritation 04/07/2021   Acute bronchitis with COPD ([place]) 04/07/2021   Acute rhinosinusitis 04/07/2021   Aortic atherosclerosis ([place]) 02/26/2021   Encounter for chronic pain management 05/10/2020   Pneumonia due to COVID-19 virus 12/02/2019  COPD mixed type ([place]) 02/02/2018   GERD (gastroesophageal reflux disease) 08/29/2017   Chronic leg pain 12/15/2015   History of left below knee amputation ([place]) 08/31/2015   Hyperlipidemia 02/18/2014   Chronic back pain 07/10/2013   Insomnia 07/10/2013   Generalized anxiety disorder 07/10/2013   PCP:  [name],  MD Pharmacy:   [facility], [place] [facility] [address] [place] [address] Phone: [phone] Fax: [phone]     Social Determinants of Health (SDOH) Interventions    Readmission Risk Interventions No flowsheet data found.

## 2021-08-16 NOTE — Progress Notes (Signed)
PROGRESS NOTE   [name]  GMW:[id number] DOB: [birth year] DOA: 08/14/2021 PCP: [name], MD   Chief Complaint  Patient presents with   Fatigue   Level of care: Telemetry  Brief Admission History:  70 y.o. male with medical history significant for malignant neoplasm of right lung (follows with Dr. Delton Coombes), COPD, GERD, right BKA and COVID-19 in August who presents to the emergency department accompanied by sister at bedside due to being asked to go to the ED by his PCP because of increased fatigue and increased respiratory and heart rate.  Patient complained of decreased oral intake with subsequent worsening weakness, he followed up with his PCP today and was noted to be tachycardic and tachypneic in the office, so he was asked to go to the ED for further evaluation and for IV hydration due to dehydration.  Assessment & Plan:   Active Problems:   Dehydration   Lactic acidosis   Thrombocytopenia (HCC)   Hyperglycemia   Transaminitis   Metastatic lung cancer (metastasis from lung to other site) (HCC)   Hyponatremia   Protein-calorie malnutrition, severe   Dehydration -due to poor oral intake as a result of his cancer.  He responded well to IV fluid hydration however his overall prognosis remains poor.  Metastatic lung cancer-he has an appointment with Dr. Valeta Harms for possible biopsy.  He remains precontemplative at this time.  Appreciate palliative medicine consultation and recommendations.  Pain management ordered by palliative team.  DNR - as per discussion with palliative care team.    Anxiety disorder-appreciate orders provided by palliative care team for anxiolytic therapy.  Transaminitis-recheck LFTs in a.m.  Hyperglycemia-thought reactive, follow.  Lactic acidosis-treating with IV fluid hydration.  Thrombocytopenia-remains low, no active bleeding at this time, holding all heparin products continue to follow closely.  Hypomagnesemia-IV replacement ordered  recheck in a.m.  Anemia in neoplastic disease-hemoglobin down to 12.6 likely hemodilution from IV fluids, recheck in a.m.  Dysphagia-dysphagia 3 diet ordered by SLP.  Aspiration precautions   DVT prophylaxis: SCDs Code Status: DNR  Family Communication: plan of care discussed with patient at bedside  Disposition: TBD Status is: Observation  The patient remains OBS appropriate and will d/c before 2 midnights.  Dispo: The patient is from: Home              Anticipated d/c is to: Home              Patient currently is not medically stable to d/c.   Difficult to place patient No  Consultants:  Palliative care   Procedures:  N/a  Antimicrobials:  N/a   Subjective: Pt reports that overall he feels bad.  He says that he has no appetite.  He is upset about his decline. He is tearful at times.   Objective: Vitals:   08/15/21 1801 08/15/21 2014 08/16/21 0439 08/16/21 1444  BP: (!) 147/81 (!) 149/79 123/66 122/73  Pulse: (!) 110 (!) 106 (!) 109 (!) 106  Resp: 18 19 19 20   Temp: 98.4 F (36.9 C) 98.2 F (36.8 C) 98.4 F (36.9 C) 97.9 F (36.6 C)  TempSrc: Oral Oral  Oral  SpO2: 100% 100% 91% 92%  Weight:      Height:        Intake/Output Summary (Last 24 hours) at 08/16/2021 1503 Last data filed at 08/16/2021 0900 Gross per 24 hour  Intake 1560 ml  Output --  Net 1560 ml   Filed Weights   08/14/21 1601  Weight:  70.3 kg   Examination:  General exam: appears frail, but alert, cooperative, Appears calm and comfortable  Respiratory system: Clear to auscultation. Respiratory effort normal. Cardiovascular system: normal S1 & S2 heard. No JVD, murmurs, rubs, gallops or clicks. No pedal edema. Gastrointestinal system: Abdomen is nondistended, soft and nontender. No organomegaly or masses felt. Normal bowel sounds heard. Central nervous system: Alert and oriented x 3. No focal neurological deficits. Extremities: Symmetric 5 x 5 power. Skin: No rashes, lesions or  ulcers Psychiatry: Judgement and insight appear normal. Mood & affect appropriate.   Data Reviewed: I have personally reviewed following labs and imaging studies  CBC: Recent Labs  Lab 08/14/21 1741 08/15/21 0429 08/16/21 0544  WBC 5.6 5.0 4.2  NEUTROABS 3.0  --   --   HGB 15.0 13.4 12.6*  HCT 44.1 39.5 37.3*  MCV 89.3 89.2 88.6  PLT 56* 51* 43*    Basic Metabolic Panel: Recent Labs  Lab 08/14/21 1741 08/15/21 0429 08/16/21 0544  NA 131* 135 137  K 4.5 4.4 3.5  CL 90* 92* 94*  CO2 29 32 31  GLUCOSE 142* 120* 109*  BUN 28* 21 11  CREATININE 0.93 0.75 0.55*  CALCIUM 9.6 8.7* 9.0  MG 2.1 1.8 1.6*  PHOS 5.0* 4.1  --     GFR: Estimated Creatinine Clearance: 89.1 mL/min (A) (by C-G formula based on SCr of 0.55 mg/dL (L)).  Liver Function Tests: Recent Labs  Lab 08/14/21 1741 08/15/21 0429 08/16/21 0544  AST 58* 54* 49*  ALT 34 29 28  ALKPHOS 256* 213* 176*  BILITOT 0.9 1.0 0.9  PROT 7.1 6.2* 5.9*  ALBUMIN 3.0* 2.6* 2.5*    CBG: No results for input(s): GLUCAP in the last 168 hours.  Recent Results (from the past 240 hour(s))  SARS CORONAVIRUS 2 (TAT 6-24 HRS) Nasopharyngeal Nasopharyngeal Swab     Status: None   Collection Time: 08/15/21  1:55 AM   Specimen: Nasopharyngeal Swab  Result Value Ref Range Status   SARS Coronavirus 2 NEGATIVE NEGATIVE Final    Comment: (NOTE) SARS-CoV-2 target nucleic acids are NOT DETECTED.  The SARS-CoV-2 RNA is generally detectable in upper and lower respiratory specimens during the acute phase of infection. Negative results do not preclude SARS-CoV-2 infection, do not rule out co-infections with other pathogens, and should not be used as the sole basis for treatment or other patient management decisions. Negative results must be combined with clinical observations, patient history, and epidemiological information. The expected result is Negative.  Fact Sheet for  Patients: SugarRoll.be  Fact Sheet for Healthcare Providers: https://www.woods-mathews.com/  This test is not yet approved or cleared by the [place] FDA and  has been authorized for detection and/or diagnosis of SARS-CoV-2 by FDA under an Emergency Use Authorization (EUA). This EUA will remain  in effect (meaning this test can be used) for the duration of the COVID-19 declaration under Se ction 564(b)(1) of the Act, 21 U.S.C. section 360bbb-3(b)(1), unless the authorization is terminated or revoked sooner.  Performed at [facility], [place] [address] [place], [place] [address]   MRSA Next Gen by PCR, Nasal     Status: None   Collection Time: 08/15/21  6:25 PM   Specimen: Nasal Mucosa; Nasal Swab  Result Value Ref Range Status   MRSA by PCR Next Gen NOT DETECTED NOT DETECTED Final    Comment: (NOTE) The GeneXpert MRSA Assay (FDA approved for NASAL specimens only), is one component of a comprehensive MRSA  colonization surveillance program. It is not intended to diagnose MRSA infection nor to guide or monitor treatment for MRSA infections. Test performance is not FDA approved in patients less than 13 years old. Performed at [facility], [address], [place], [place] [address]      Radiology Studies: CT Soft Tissue Neck W Contrast  Result Date: 08/15/2021 CLINICAL DATA:  Soft tissue mass of the neck EXAM: CT NECK WITH CONTRAST TECHNIQUE: Multidetector CT imaging of the neck was performed using the standard protocol following the bolus administration of intravenous contrast. CONTRAST:  139mL OMNIPAQUE IOHEXOL 350 MG/ML SOLN COMPARISON:  None. FINDINGS: PHARYNX AND LARYNX: The nasopharynx, oropharynx and larynx are normal. Visible portions of the oral cavity, tongue base and floor of mouth are normal. Normal epiglottis, vallecula and pyriform sinuses. The larynx is normal. No retropharyngeal abscess, effusion or lymphadenopathy.  SALIVARY GLANDS: Normal parotid, submandibular and sublingual glands. THYROID: Normal. LYMPH NODES: Hyperenhancing mass adjacent to the left transverse process of C1 measuring 3.0 x 1.5 cm. There is expansion of the adjacent left obliquus capitis inferior muscle (3:31). The mass likely extends into the left C1 transverse foramen. VASCULAR: Major cervical vessels are patent. LIMITED INTRACRANIAL: Normal. VISUALIZED ORBITS: Normal. MASTOIDS AND VISUALIZED PARANASAL SINUSES: No fluid levels or advanced mucosal thickening. No mastoid effusion. SKELETON: No bony spinal canal stenosis. No lytic or blastic lesions. UPPER CHEST: Partially necrotic mass of the upper mediastinum measures approximately 3.4 x 3.5 cm and is better characterized on concomitant CTA chest. There is emphysema. OTHER: None. IMPRESSION: 1. Hyperenhancing mass adjacent to the left transverse process of C1 with expansion of the adjacent left obliquus capitis inferior muscle, most consistent with metastatic disease. The mass likely extends into the left C1 transverse foramen but the left vertebral artery remains patent. 2. Partially necrotic mass of the upper mediastinum, better characterized on concomitant CTA chest. Emphysema (ICD10-J43.9). Electronically Signed   By: Ulyses Jarred M.D.   On: 08/15/2021 00:46   CT Angio Chest PE W and/or Wo Contrast  Result Date: 08/15/2021 CLINICAL DATA:  Tachycardia, weakness, dehydration.  Lung cancer. EXAM: CT ANGIOGRAPHY CHEST WITH CONTRAST TECHNIQUE: Multidetector CT imaging of the chest was performed using the standard protocol during bolus administration of intravenous contrast. Multiplanar CT image reconstructions and MIPs were obtained to evaluate the vascular anatomy. CONTRAST:  111mL OMNIPAQUE IOHEXOL 350 MG/ML SOLN COMPARISON:  PET-CT dated 08/03/2021 FINDINGS: Cardiovascular: Satisfactory opacification the bilateral pulmonary arteries to the segmental level. No evidence of pulmonary embolism. Although  not tailored for evaluation of the thoracic aorta, there is no evidence of thoracic aortic aneurysm or dissection. Heart is normal in size.  No pericardial effusion. Coronary atherosclerosis of the LAD and right coronary artery. Mediastinum/Nodes: Bulky mediastinal lymphadenopathy, unchanged from recent PET. Dominant right paratracheal node measures 3.1 cm short axis (series 5/image 28). Visualized thyroid is unremarkable. Lungs/Pleura: Bulky right infrahilar/right lower lobe mass narrowing the right lower lobe bronchus, measuring 4.1 x 6.4 cm (series 5/image 46), unchanged from recent PET. Mild postobstructive opacity in the right lower lobe (series 7/image 102). Moderate centrilobular and paraseptal emphysematous changes, upper lung predominant. No pleural effusion or pneumothorax. Upper Abdomen: Visualized upper abdomen is notable for a small hiatal hernia. Musculoskeletal: Multifocal lytic metastases, including the right sternum (series 5/image 12) and the left T7 transverse process (series 5/image 40). Additional vertebral body lesions are better evaluated on recent PET. Review of the MIP images confirms the above findings. IMPRESSION: No evidence of pulmonary embolism. Bulky right infrahilar/right  lower lobe mass, corresponding to the patient's known primary bronchogenic neoplasm, unchanged from recent PET. Bulky mediastinal lymphadenopathy. Multifocal osseous metastases, better evaluated on recent PET. Aortic Atherosclerosis (ICD10-I70.0) and Emphysema (ICD10-J43.9). Electronically Signed   By: Julian Hy M.D.   On: 08/15/2021 00:36   DG Chest Port 1 View  Result Date: 08/14/2021 CLINICAL DATA:  Hypoxia, shortness of breath, recent COVID. Known lung cancer EXAM: PORTABLE CHEST 1 VIEW COMPARISON:  Chest radiograph 08/09/2021 FINDINGS: The cardiomediastinal silhouette is stable, allowing for significant rightward patient rotation. Increased interstitial markings seen throughout both lungs are grossly  similar. Opacity in the right lower lobe has improved. There is no new focal airspace disease. There is no pleural effusion. There is no pneumothorax. The known right hilar mass and mediastinal lymphadenopathy are not well evaluated partially due to patient rotation. There is no acute osseous abnormality. IMPRESSION: 1. Improved opacities in the right middle lobe. No new focal airspace disease. 2. The known right hilar mass and associated lymphadenopathy are not well evaluated. Electronically Signed   By: Valetta Mole M.D.   On: 08/14/2021 17:30   US Abdomen Limited RUQ (LIVER/GB)  Result Date: 08/15/2021 CLINICAL DATA:  Generalized abdominal pain x3 weeks EXAM: ULTRASOUND ABDOMEN LIMITED RIGHT UPPER QUADRANT COMPARISON:  None. FINDINGS: Gallbladder: Sludge and mobile gallbladder stones are seen within the gallbladder fundus. The gallbladder, however, is not distended, there is no gallbladder wall thickening, and no pericholecystic fluid is identified. The sonographic Percell Miller sign is reportedly negative. Common bile duct: Diameter: 4 mm in proximal diameter Liver: No focal lesion identified. Within normal limits in parenchymal echogenicity. Portal vein is patent on color Doppler imaging with normal direction of blood flow towards the liver. Other: None. IMPRESSION: Cholelithiasis without sonographic evidence of acute cholecystitis. Electronically Signed   By: Fidela Salisbury M.D.   On: 08/15/2021 12:05    Scheduled Meds:  feeding supplement  237 mL Oral TID BM   mirtazapine  7.5 mg Oral QHS   multivitamin with minerals  1 tablet Oral Daily   nortriptyline  100 mg Oral QHS   temazepam  15 mg Oral QHS   Continuous Infusions:  sodium chloride 75 mL/hr at 08/16/21 0300     LOS: 0 days   Time spent: 35 mins  Mazzie Brodrick Wynetta Emery, MD How to contact the [facility] Attending or Consulting provider [place] or covering provider during after hours [place], for this patient?  Check the care team in [facility] and look for a)  attending/consulting [facility] provider listed and b) the [facility] team listed Log into [email or web address] and use [place] universal password to access. If you do not have the password, please contact the hospital operator. Locate the [facility] provider you are looking for under [facility] and page to a number that you can be directly reached. If you still have difficulty reaching the provider, please page the [facility] (Director on Call) for the Hospitalists listed on amion for assistance.  08/16/2021, 3:03 PM

## 2021-08-16 NOTE — Care Management Obs Status (Signed)
[place] NOTIFICATION   Patient Details  Name: [name] MRN: [id number] Date of Birth: June 26, 1951   Medicare Observation Status Notification Given:  Yes    Tommy Medal 08/16/2021, 11:54 AM

## 2021-08-16 NOTE — Progress Notes (Signed)
Initial Nutrition Assessment  DOCUMENTATION CODES:  Severe malnutrition in context of chronic illness  INTERVENTION:  Downgrade diet to Dysphagia 3 with thin liquids.  Increase Ensure from BID to TID.  Add Magic cup TID with meals, each supplement provides 290 kcal and 9 grams of protein.  Add MVI with minerals daily.  NUTRITION DIAGNOSIS:  Severe Malnutrition related to chronic illness, cancer and cancer related treatments as evidenced by percent weight loss, severe muscle depletion.  GOAL:  Patient will meet greater than or equal to 90% of their needs  MONITOR:  PO intake, Supplement acceptance, Diet advancement, Labs, Weight trends, I & O's  REASON FOR ASSESSMENT:  Malnutrition Screening Tool    ASSESSMENT:  70 yo male with a PMH of malignant neoplasm of right lung, COPD, GERD, right BKA, and COVID-19 in August who presents to the emergency department by his PCP because of increased fatigue and increased respiratory and heart rate. Patient complained of decreased oral intake with subsequent worsening weakness, he followed up with his PCP today and was noted to be tachycardic and tachypneic in the office, so he was asked to go to the ED for further evaluation and for IV hydration due to dehydration.  Per MD note, "He has been noted to have poor oral intake of solids and liquids over the last 2-3 weeks and appears to be quite dehydrated. Palliative consultation ordered to evaluate potential failure to thrive and reassess goals of care."  Attempted to speak with pt at bedside. Pt very weak, only able to nod and shake head and mouth a few words. Pt able to report that he has been eating poorly with no appetite. He also reports drinking Ensure at home and likes the chocolate flavor.  Per Epic, pt has lost ~22 lbs (12.6%) in the past 5.5 months, which is significant and severe for the time frame.  Recommend increasing Ensure to TID from BID, adding Magic Cup TID, and MVI with  minerals daily. Pt to be downgraded to Dysphagia 3 to allow for an easier time chewing and swallowing.  Supplements: Ensure Enlive BID  Medications: reviewed; NaCl @ 75 ml/hr, Mag Sulfate per IV, Xanax PRN (given once today)  Labs: reviewed; Glucose 109 (H), Mag 1.6 (L)  NUTRITION - FOCUSED PHYSICAL EXAM: Flowsheet Row Most Recent Value  Orbital Region Severe depletion  Upper Arm Region Mild depletion  Thoracic and Lumbar Region Mild depletion  Buccal Region Moderate depletion  Temple Region Severe depletion  Clavicle Bone Region Moderate depletion  Clavicle and Acromion Bone Region Severe depletion  Scapular Bone Region Severe depletion  Dorsal Hand Severe depletion  Patellar Region Severe depletion  Anterior Thigh Region Severe depletion  Posterior Calf Region Severe depletion  [L BKA, only assessed RLE]  Edema (RD Assessment) None  Hair Reviewed  Eyes Reviewed  Mouth Unable to assess  Skin Reviewed  Nails Reviewed   Diet Order:   Diet Order             Diet Heart Room service appropriate? Yes; Fluid consistency: Thin  Diet effective now                  EDUCATION NEEDS:  Education needs have been addressed  Skin:  Skin Assessment: Reviewed RN Assessment (Ecchymosis)  Last BM:  08/15/21  Height:  Ht Readings from Last 1 Encounters:  08/14/21 6' (1.829 m)   Weight:  Wt Readings from Last 1 Encounters:  08/14/21 70.3 kg   BMI:  Body mass  index is 21.02 kg/m.  Estimated Nutritional Needs:  Kcal:  2100-2300 Protein:  95-110 grams Fluid:  >2.1 L  Derrel Nip, RD, LDN (she/her/hers) Registered Dietitian I After-Hours/Weekend Pager # in [place]

## 2021-08-16 NOTE — Telephone Encounter (Signed)
May have a verbal order for this patient currently in the hospital

## 2021-08-16 NOTE — Evaluation (Signed)
Occupational Therapy Evaluation Patient Details Name: [name] MRN: [id number] DOB: 12-19-1950 Today's Date: 08/16/2021   History of Present Illness [name] is a 70 y.o. male with medical history significant for malignant neoplasm of right lung (follows with Dr. Delton Coombes), COPD, GERD, right BKA and COVID-19 in August who presents to the emergency department accompanied by sister at bedside due to being asked to go to the ED by his PCP because of increased fatigue and increased respiratory and heart rate.  Patient complained of decreased oral intake with subsequent worsening weakness, he followed up with his PCP today and was noted to be tachycardic and tachypneic in the office, so he was asked to go to the ED for further evaluation and for IV hydration due to dehydration   Clinical Impression   Pt agreeable to OT evaluation. PT able to don sock with Mod I level of assist seated at EOB. Mod I level of assist for bed mobility. Pt reports fatigue and attempted sit to stand with RW without prosthetic due to pt report that it no longer fits correctly since weight loss. Pt completed partial stand with mod A prior to returning to bed due to fatigue. Pt demonstrates generalized B UE weakness and poor endurance. Pt will benefit from continued OT in the hospital and recommended venue below to increase strength, balance, and endurance for safe ADL's.        Recommendations for follow up therapy are one component of a multi-disciplinary discharge planning process, led by the attending physician.  Recommendations may be updated based on patient status, additional functional criteria and insurance authorization.   Follow Up Recommendations  Home health OT;Other (comment);Supervision/Assistance - 24 hour (Assist for all out of bed/chair mobility and ADL's as needed by [facility] staff.) Pt recommended to return to ALF.    Equipment Recommendations  None recommended by OT           Precautions  / Restrictions Precautions Precautions: Fall Restrictions Weight Bearing Restrictions: No      Mobility Bed Mobility Overal bed mobility: Modified Independent             General bed mobility comments: with HOB raised    Transfers Overall transfer level: Needs assistance Equipment used: Rolling walker (2 wheeled) Transfers: Sit to/from Stand Sit to Stand: Mod assist         General transfer comment: Pt completed partial stand due to fatigue. Pt returned to bed after partial stand. Pt not interested in attempting SPT. RW used. Pt R prosthetic not used due to pt report that it no longer fits correctly due to recent weight loss.    Balance Overall balance assessment: Needs assistance Sitting-balance support: No upper extremity supported;Feet supported Sitting balance-Leahy Scale: Good Sitting balance - Comments: at EOB   Standing balance support: Bilateral upper extremity supported;During functional activity Standing balance-Leahy Scale: Poor Standing balance comment: using RW                           ADL either performed or assessed with clinical judgement   ADL Overall ADL's : Needs assistance/impaired                     Lower Body Dressing: Modified independent;Sitting/lateral leans Lower Body Dressing Details (indicate cue type and reason): Pt able to don sock while seated at [employer] Transfer: Moderate assistance;Maximal assistance;RW;Stand-pivot Toilet Transfer Details (indicate cue type and reason): Partially simulated  via partial stand from EOB using RW. Pt required MOD A and reported being limited by fatigue.                 Vision Baseline Vision/History: 1 Wears glasses (PRN) Ability to See in Adequate Light: 0 Adequate Patient Visual Report: No change from baseline                  Pertinent Vitals/Pain Pain Assessment: 0-10 Pain Score: 8  Pain Location: everywhere Pain Descriptors / Indicators: Other (Comment)  (cripling) Pain Intervention(s): Limited activity within patient's tolerance;Monitored during session     Hand Dominance Right   Extremity/Trunk Assessment Upper Extremity Assessment Upper Extremity Assessment: Generalized weakness   Lower Extremity Assessment Lower Extremity Assessment: Defer to PT evaluation RLE Deficits / Details: R BKA, hip and knee AROM WNL, reports pain in hip limiting mobility LLE Deficits / Details: AROM WNL throughout, reports pain in hip limiting mobility   Cervical / Trunk Assessment Cervical / Trunk Assessment: Normal   Communication Communication Communication: No difficulties   Cognition Arousal/Alertness: Awake/alert Behavior During Therapy: WFL for tasks assessed/performed Overall Cognitive Status: Within Functional Limits for tasks assessed                                     General Comments  HR 110-120 during eval            Home Living Family/patient expects to be discharged to:: Assisted living ([facility])                                 Additional Comments: Pt reports staff would be available to assist with transfers "most of the time."      Prior Functioning/Environment Level of Independence: Needs assistance  Gait / Transfers Assistance Needed: Pt reports household mobility using prosthetic for R LE. ADL's / Homemaking Assistance Needed: PT reports indpendence for ADL's with staff assisting with IADL's   Comments: dpendence for ADL        OT Problem List: Decreased strength;Decreased activity tolerance;Impaired balance (sitting and/or standing);Pain      OT Treatment/Interventions: Self-care/ADL training;Therapeutic exercise;Energy conservation;Therapeutic activities;Patient/family education;Balance training    OT Goals(Current goals can be found in the care plan section) Acute Rehab OT Goals Patient Stated Goal: To return to [facility]. OT Goal Formulation: With patient Time For Goal  Achievement: 08/30/21 Potential to Achieve Goals: Fair  OT Frequency: Min 2X/week    AM-PAC OT "6 Clicks" Daily Activity     Outcome Measure Help from another person eating meals?: None Help from another person taking care of personal grooming?: A Little Help from another person toileting, which includes using toliet, bedpan, or urinal?: A Lot Help from another person bathing (including washing, rinsing, drying)?: A Little Help from another person to put on and taking off regular upper body clothing?: None Help from another person to put on and taking off regular lower body clothing?: A Little 6 Click Score: 19   End of Session Equipment Utilized During Treatment: Rolling walker Nurse Communication: Mobility status;Other (comment) (Status of condom catheter.)  Activity Tolerance: Patient limited by fatigue Patient left: in bed;with call bell/phone within reach;with bed alarm set  OT Visit Diagnosis: Unsteadiness on feet (R26.81);Muscle weakness (generalized) (M62.81)                Time:  9024-0973 OT Time Calculation (min): 25 min Charges:  OT General Charges $OT Visit: 1 Visit OT Evaluation $OT Eval Moderate Complexity: 1 Mod  Langley Ingalls OT, MOT  Larey Seat 08/16/2021, 12:06 PM

## 2021-08-16 NOTE — Telephone Encounter (Signed)
Please advise. Thank you

## 2021-08-16 NOTE — Consult Note (Signed)
Consultation Note Date: 08/16/2021   Patient Name: [name]  DOB: 09-Jan-1951  MRN: [id number]  Age / Sex: 70 y.o., male  PCP: [name], MD Referring Physician: Murlean Iba, MD  Reason for Consultation: Establishing goals of care  HPI/Patient Profile: 70 y.o. male  with past medical history of malignant neoplasm of right lung (awaiting biopsy and ongoing work up outpatient with Dr. Delton Coombes), COPD, GERD, right BKA, COVID infection in August 2022 admitted on 08/14/2021 from [facility] long term care with decreased oral intake, weakness, increase respiratory rate and heart rate and found to have dehydration, hyponatremia, hyperglycemia, transaminitis, thrombocytopenia. Abd ultrasound with no acute findings and only cholelithiasis noted.   Clinical Assessment and Goals of Care: I met today with [name] and his sister, Vaughan Basta, at bedside. [name] is curled up in bed and complains of back pain. They report that his pain has been increasing over the past 2-3 months along with weight loss and decreased appetite. [name] also complains of insomnia and anxiety. Sounds like he cannot stop thinking to truly relax and get any sleep. We discussed management of pain, anxiety, and insomnia and recommendations/changes listed below. [name] and Vaughan Basta are pleased with suggestions and hopeful for improved relief. [name] is feeling improved after IV fluids but overall still with much symptom burden.   During our conversation [name] is tearful. He shares that he is not sure he wants to live the way he is feeling. We discussed goals to better manage symptoms and help him to feel better. [name] asks "how long do I have?" We discussed that nobody really knows how long any of [place] have but the important thing to do is make the most out of the time we do have. I explained to [name] that his care is in his  control and although we will do everything we can to help him feel better we will not make him go through anything he does not wish to go through. We discussed code status and DNR is decided (they talked about how they had DNR for their mother). We also prompted the question of even proceeding with biopsy if he continues to feel this way. I encouraged him to let us know what he truly wants along this journey and also that we don't have to figure everything out today. I encouraged him to let us work with him on his symptoms and we can continue to discuss how we can best manage his care. His sister Vaughan Basta reassures [name] that his family will support and love him no matter what. I left them to speak further as a family and with HCPOA/Living Will packet.   All questions/concerns addressed. Emotional support provided. Updated RN and Dr. Wynetta Emery.   Primary Decision Maker PATIENT    SUMMARY OF RECOMMENDATIONS   - DNR decided - Hopeful for pain/anxiety/insomnia to be better controlled - Questioning his desire for aggressive treatment/workup  Code Status/Advance Care Planning: DNR   Symptom Management:  Pain: OxyIR 5 mg every 4 hours as needed. Consider increase dose/frequency as needed to get relief. Anxiety: Xanax 0.5 mg 3x daily as needed.  Insomnia/poor intake: Mirtazapine 7.5 mg qhs trial. Continue temazepam.  Bowel Regimen: LBM 9/13 x 2. Senokot added daily prn but consider scheduled.   Palliative Prophylaxis:  Aspiration, Bowel Regimen, Delirium Protocol, Frequent Pain Assessment, Oral Care, and Turn Reposition  Psycho-social/Spiritual:  Desire for further Chaplaincy support:yes Additional Recommendations: Education on Hospice and Grief/Bereavement Support  Prognosis:  Overall prognosis poor with metastatic disease and declining functional status and nutritional status.   Discharge Planning: To Be Determined      Primary Diagnoses: Present on Admission:  Dehydration   I  have reviewed the medical record, interviewed the patient and family, and examined the patient. The following aspects are pertinent.  Past Medical History:  Diagnosis Date   Anxiety disorder    Aortic atherosclerosis ([place]) 02/26/2021   Seen on CT scan 2020   Cancer [facility])    Chronic insomnia    Chronic low back pain    COPD (chronic obstructive pulmonary disease) (HCC)    GERD (gastroesophageal reflux disease) 08/29/2017   Hx of right BKA (HCC)    Secondary to trauma   Hyperlipidemia    Social History   Socioeconomic History   Marital status: Divorced    Spouse name: Not on file   Number of children: Not on file   Years of education: Not on file   Highest education level: Not on file  Occupational History   Not on file  Tobacco Use   Smoking status: Light Smoker    Packs/day: 1.00    Years: 50.00    Pack years: 50.00    Types: Cigarettes    Start date: 20   Smokeless tobacco: Never   Tobacco comments:    Currently smoking 1cig a day as of 08/10/21 ep  Substance and Sexual Activity   Alcohol use: No   Drug use: Not on file   Sexual activity: Not Currently  Other Topics Concern   Not on file  Social History Narrative   Not on file   Social Determinants of Health   Financial Resource Strain: Low Risk    Difficulty of Paying Living Expenses: Not hard at all  Food Insecurity: No Food Insecurity   Worried About Charity fundraiser in the Last Year: Never true   Ran Out of Food in the Last Year: Never true  Transportation Needs: No Transportation Needs   Lack of Transportation (Medical): No   Lack of Transportation (Non-Medical): No  Physical Activity: Inactive   Days of Exercise per Week: 0 days   Minutes of Exercise per Session: 0 min  Stress: No Stress Concern Present   Feeling of Stress : Only a little  Social Connections: Unknown   Frequency of Communication with Friends and Family: More than three times a week   Frequency of Social Gatherings with Friends  and Family: More than three times a week   Attends Religious Services: Never   Marine scientist or Organizations: No   Attends Music therapist: Never   Marital Status: Not on file   Family History  Problem Relation Age of Onset   Healthy Mother    Healthy Father    Scheduled Meds:  feeding supplement  237 mL Oral BID BM   nortriptyline  100 mg Oral QHS   temazepam  15 mg Oral QHS   Continuous Infusions:  sodium chloride 75 mL/hr at 08/16/21 0300   magnesium sulfate bolus IVPB 4 g (08/16/21 0914)   PRN Meds:.ALPRAZolam Allergies  Allergen Reactions   Asa [Aspirin] Other (See Comments)    Intolerance to aspirin due to ulcer   Benadryl [Diphenhydramine Hcl] Other (See Comments)    Nervousness, insomnia   Review of Systems  Constitutional:  Positive for activity change, appetite change and fatigue.  Musculoskeletal:  Positive for back pain.  Neurological:  Positive for weakness.  Psychiatric/Behavioral:  Positive for sleep disturbance. The patient is nervous/anxious.    Physical Exam Vitals and nursing note reviewed.  Constitutional:      General: He is not in acute distress.    Appearance: He is ill-appearing.  Cardiovascular:     Rate and Rhythm: Tachycardia present.  Pulmonary:     Effort: No tachypnea, accessory muscle usage or respiratory distress.  Abdominal:     Palpations: Abdomen is soft.  Neurological:     Mental Status: He is alert and oriented to person, place, and time.     Comments: Appropriately tearful    Vital Signs: BP 123/66 (BP Location: Left Arm)   Pulse (!) 109   Temp 98.4 F (36.9 C)   Resp 19   Ht 6' (1.829 m)   Wt 70.3 kg   SpO2 91%   BMI 21.02 kg/m  Pain Scale: 0-10   Pain Score: Asleep   SpO2: SpO2: 91 % O2 Device:SpO2: 91 % O2 Flow Rate: .O2 Flow Rate (L/min): 2 L/min  IO: Intake/output summary:  Intake/Output Summary (Last 24 hours) at 08/16/2021 0959 Last data filed at 08/16/2021 0900 Gross per 24 hour   Intake 1560 ml  Output --  Net 1560 ml    LBM: Last BM Date: 08/15/21 Baseline Weight: Weight: 70.3 kg Most recent weight: Weight: 70.3 kg     Palliative Assessment/Data:     Time Total: 70 min  Greater than 50%  of this time was spent counseling and coordinating care related to the above assessment and plan.  Signed by: Vinie Sill, NP Palliative Medicine Team Pager # [phone] (M-F 8a-5p) Team Phone # [phone] (Nights/Weekends)

## 2021-08-17 DIAGNOSIS — E872 Acidosis: Secondary | ICD-10-CM | POA: Diagnosis not present

## 2021-08-17 DIAGNOSIS — C3491 Malignant neoplasm of unspecified part of right bronchus or lung: Secondary | ICD-10-CM | POA: Diagnosis not present

## 2021-08-17 DIAGNOSIS — E871 Hypo-osmolality and hyponatremia: Secondary | ICD-10-CM | POA: Diagnosis not present

## 2021-08-17 DIAGNOSIS — Z515 Encounter for palliative care: Secondary | ICD-10-CM | POA: Diagnosis not present

## 2021-08-17 DIAGNOSIS — E860 Dehydration: Secondary | ICD-10-CM | POA: Diagnosis not present

## 2021-08-17 DIAGNOSIS — R739 Hyperglycemia, unspecified: Secondary | ICD-10-CM | POA: Diagnosis not present

## 2021-08-17 DIAGNOSIS — Z7189 Other specified counseling: Secondary | ICD-10-CM | POA: Diagnosis not present

## 2021-08-17 LAB — CBC
HCT: 35 % — ABNORMAL LOW (ref 39.0–52.0)
Hemoglobin: 11.9 g/dL — ABNORMAL LOW (ref 13.0–17.0)
MCH: 30.1 pg (ref 26.0–34.0)
MCHC: 34 g/dL (ref 30.0–36.0)
MCV: 88.6 fL (ref 80.0–100.0)
Platelets: 40 10*3/uL — ABNORMAL LOW (ref 150–400)
RBC: 3.95 MIL/uL — ABNORMAL LOW (ref 4.22–5.81)
RDW: 12.2 % (ref 11.5–15.5)
WBC: 3.6 10*3/uL — ABNORMAL LOW (ref 4.0–10.5)
nRBC: 1.1 % — ABNORMAL HIGH (ref 0.0–0.2)

## 2021-08-17 LAB — COMPREHENSIVE METABOLIC PANEL
ALT: 24 U/L (ref 0–44)
AST: 54 U/L — ABNORMAL HIGH (ref 15–41)
Albumin: 2.5 g/dL — ABNORMAL LOW (ref 3.5–5.0)
Alkaline Phosphatase: 154 U/L — ABNORMAL HIGH (ref 38–126)
Anion gap: 12 (ref 5–15)
BUN: 8 mg/dL (ref 8–23)
CO2: 31 mmol/L (ref 22–32)
Calcium: 8.7 mg/dL — ABNORMAL LOW (ref 8.9–10.3)
Chloride: 93 mmol/L — ABNORMAL LOW (ref 98–111)
Creatinine, Ser: 0.6 mg/dL — ABNORMAL LOW (ref 0.61–1.24)
GFR, Estimated: >60 mL/min (ref >60)
Glucose, Bld: 107 mg/dL — ABNORMAL HIGH (ref 70–99)
Potassium: 3.3 mmol/L — ABNORMAL LOW (ref 3.5–5.1)
Sodium: 136 mmol/L (ref 135–145)
Total Bilirubin: 1 mg/dL (ref 0.3–1.2)
Total Protein: 5.8 g/dL — ABNORMAL LOW (ref 6.5–8.1)

## 2021-08-17 LAB — MAGNESIUM: Magnesium: 1.9 mg/dL (ref 1.7–2.4)

## 2021-08-17 MED ORDER — IPRATROPIUM-ALBUTEROL 0.5-2.5 (3) MG/3ML IN SOLN
3.0000 mL | Freq: Four times a day (QID) | RESPIRATORY_TRACT | Status: DC
  Administered 2021-08-17 – 2021-08-18 (×6): 3 mL via RESPIRATORY_TRACT
  Filled 2021-08-17 (×7): qty 3

## 2021-08-17 MED ORDER — DEXTROMETHORPHAN-GUAIFENESIN 10-100 MG/5ML PO LIQD
5.0000 mL | ORAL | Status: DC | PRN
  Filled 2021-08-17: qty 5

## 2021-08-17 MED ORDER — ALBUTEROL SULFATE HFA 108 (90 BASE) MCG/ACT IN AERS: 2.0000 | INHALATION_SPRAY | RESPIRATORY_TRACT | Status: DC | PRN

## 2021-08-17 MED ORDER — PANTOPRAZOLE SODIUM 40 MG PO TBEC
40.0000 mg | DELAYED_RELEASE_TABLET | Freq: Every day | ORAL | Status: DC
  Administered 2021-08-17 – 2021-08-18 (×2): 40 mg via ORAL
  Filled 2021-08-17 (×2): qty 1

## 2021-08-17 MED ORDER — MOMETASONE FURO-FORMOTEROL FUM 200-5 MCG/ACT IN AERO
2.0000 | INHALATION_SPRAY | Freq: Two times a day (BID) | RESPIRATORY_TRACT | Status: DC
  Administered 2021-08-17 – 2021-08-18 (×3): 2 via RESPIRATORY_TRACT
  Filled 2021-08-17: qty 8.8

## 2021-08-17 MED ORDER — POTASSIUM CHLORIDE CRYS ER 20 MEQ PO TBCR
40.0000 meq | EXTENDED_RELEASE_TABLET | Freq: Once | ORAL | Status: AC
  Administered 2021-08-17: 40 meq via ORAL
  Filled 2021-08-17: qty 2

## 2021-08-17 MED ORDER — PREDNISONE 10 MG PO TABS
15.0000 mg | ORAL_TABLET | Freq: Every day | ORAL | Status: DC
  Administered 2021-08-17 – 2021-08-18 (×2): 15 mg via ORAL
  Filled 2021-08-17 (×2): qty 2

## 2021-08-17 MED ORDER — OXYCODONE HCL 5 MG PO TABS
5.0000 mg | ORAL_TABLET | ORAL | Status: DC | PRN
  Administered 2021-08-17 – 2021-08-18 (×3): 10 mg via ORAL
  Filled 2021-08-17 (×3): qty 2

## 2021-08-17 MED ORDER — HYOSCYAMINE SULFATE 0.125 MG SL SUBL
0.1250 mg | SUBLINGUAL_TABLET | Freq: Four times a day (QID) | SUBLINGUAL | Status: DC | PRN
  Administered 2021-08-17: 0.125 mg via ORAL
  Filled 2021-08-17: qty 1

## 2021-08-17 NOTE — Telephone Encounter (Signed)
Called [facility]  [phone] to make them aware that pt does not need to have covid test. They state pt is in ED, not doing well, and will not return to their facility. Wanted to make Dr. Valeta Harms aware.

## 2021-08-17 NOTE — Progress Notes (Signed)
MEWS yellow due to elevated pulse. MD made aware patient rhythm is sinus tach at rest. Provided most recent vitals. MEWs protocol in place.

## 2021-08-17 NOTE — Progress Notes (Signed)
PROGRESS NOTE   [name]  PZW:[id number] DOB: June 21, 1951 DOA: 08/14/2021 PCP: [name], MD   Chief Complaint  Patient presents with   Fatigue   Level of care: Med-Surg  Brief Admission History:  70 y.o. male with medical history significant for malignant neoplasm of right lung (follows with Dr. Delton Coombes), COPD, GERD, right BKA and COVID-19 in August who presents to the emergency department accompanied by sister at bedside due to being asked to go to the ED by his PCP because of increased fatigue and increased respiratory and heart rate.  Patient complained of decreased oral intake with subsequent worsening weakness, he followed up with his PCP today and was noted to be tachycardic and tachypneic in the office, so he was asked to go to the ED for further evaluation and for IV hydration due to dehydration.  Assessment & Plan:   Active Problems:   Dehydration   Lactic acidosis   Thrombocytopenia (HCC)   Hyperglycemia   Transaminitis   Metastatic lung cancer (metastasis from lung to other site) (HCC)   Hyponatremia   Protein-calorie malnutrition, severe   Dehydration -due to poor oral intake as a result of his cancer.  He responded well to IV fluid hydration however his overall prognosis remains poor.  DC IV fluid.   Metastatic lung cancer-he has an appointment with Dr. Valeta Harms for possible biopsy.  He remains precontemplative at this time.  Appreciate palliative medicine consultation and recommendations.  Pain management ordered by palliative team. Increased his oxycodone to 5-10 mg every 4 hours PRN.    DNR - as per discussion with palliative care team.    Anxiety disorder-appreciate orders provided by palliative care team for anxiolytic therapy.  Transaminitis-stable  Hyperglycemia-thought reactive, follow.  Lactic acidosis-RESOLVED.   Thrombocytopenia-remains low, no active bleeding at this time, holding all heparin products continue to follow  closely.  Hypomagnesemia-IV replacement ordered.  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  DC IV fluid today.   Dysphagia-dysphagia 3 diet ordered by SLP.  Aspiration precautions   DVT prophylaxis: SCDs Code Status: DNR  Family Communication: plan of care discussed with patient at bedside  Disposition: Home with [facility] tomorrow  Status is: INP  Dispo: The patient is from: Home              Anticipated d/c is to: Home              Patient currently is not medically stable to d/c.   Difficult to place patient No  Consultants:  Palliative care   Procedures:  N/a  Antimicrobials:  N/a   Subjective: Pt reports pain is not as well controlled today, but breathing has been better with bronchodilators.   Objective: Vitals:   08/17/21 0608 08/17/21 0917 08/17/21 1100 08/17/21 1521  BP: 136/69  140/68 (!) 111/58  Pulse: (!) 111  (!) 115 (!) 115  Resp: 18  18 18   Temp: 98.7 F (37.1 C)  98.9 F (37.2 C) 98 F (36.7 C)  TempSrc: Oral  Oral Oral  SpO2: 92% 94% 95% 93%  Weight:      Height:        Intake/Output Summary (Last 24 hours) at 08/17/2021 1741 Last data filed at 08/17/2021 1322 Gross per 24 hour  Intake 886 ml  Output 1900 ml  Net -1014 ml   Filed Weights   08/14/21 1601  Weight: 70.3 kg   Examination:  General exam: appears frail, but alert, cooperative, Appears calm and comfortable  Respiratory system: no increased work of breathing.   Cardiovascular system: normal S1 & S2 heard. Tachycardic rate.  No JVD, murmurs, rubs, gallops or clicks. No pedal edema. Gastrointestinal system: Abdomen is nondistended, soft and nontender. No organomegaly or masses felt. Normal bowel sounds heard. Central nervous system: Alert and oriented x 3. No focal neurological deficits. Extremities: Symmetric 5 x 5 power. Skin: No rashes, lesions or ulcers Psychiatry: Judgement and insight appear normal. Mood & affect appropriate.   Data Reviewed: I have personally  reviewed following labs and imaging studies  CBC: Recent Labs  Lab 08/14/21 1741 08/15/21 0429 08/16/21 0544 08/17/21 0531  WBC 5.6 5.0 4.2 3.6*  NEUTROABS 3.0  --   --   --   HGB 15.0 13.4 12.6* 11.9*  HCT 44.1 39.5 37.3* 35.0*  MCV 89.3 89.2 88.6 88.6  PLT 56* 51* 43* 40*    Basic Metabolic Panel: Recent Labs  Lab 08/14/21 1741 08/15/21 0429 08/16/21 0544 08/17/21 0531  NA 131* 135 137 136  K 4.5 4.4 3.5 3.3*  CL 90* 92* 94* 93*  CO2 29 32 31 31  GLUCOSE 142* 120* 109* 107*  BUN 28* 21 11 8   CREATININE 0.93 0.75 0.55* 0.60*  CALCIUM 9.6 8.7* 9.0 8.7*  MG 2.1 1.8 1.6* 1.9  PHOS 5.0* 4.1  --   --     GFR: Estimated Creatinine Clearance: 89.1 mL/min (A) (by C-G formula based on SCr of 0.6 mg/dL (L)).  Liver Function Tests: Recent Labs  Lab 08/14/21 1741 08/15/21 0429 08/16/21 0544 08/17/21 0531  AST 58* 54* 49* 54*  ALT 34 29 28 24   ALKPHOS 256* 213* 176* 154*  BILITOT 0.9 1.0 0.9 1.0  PROT 7.1 6.2* 5.9* 5.8*  ALBUMIN 3.0* 2.6* 2.5* 2.5*    CBG: No results for input(s): GLUCAP in the last 168 hours.  Recent Results (from the past 240 hour(s))  SARS CORONAVIRUS 2 (TAT 6-24 HRS) Nasopharyngeal Nasopharyngeal Swab     Status: None   Collection Time: 08/15/21  1:55 AM   Specimen: Nasopharyngeal Swab  Result Value Ref Range Status   SARS Coronavirus 2 NEGATIVE NEGATIVE Final    Comment: (NOTE) SARS-CoV-2 target nucleic acids are NOT DETECTED.  The SARS-CoV-2 RNA is generally detectable in upper and lower respiratory specimens during the acute phase of infection. Negative results do not preclude SARS-CoV-2 infection, do not rule out co-infections with other pathogens, and should not be used as the sole basis for treatment or other patient management decisions. Negative results must be combined with clinical observations, patient history, and epidemiological information. The expected result is Negative.  Fact Sheet for  Patients: SugarRoll.be  Fact Sheet for Healthcare Providers: https://www.woods-mathews.com/  This test is not yet approved or cleared by the [place] FDA and  has been authorized for detection and/or diagnosis of SARS-CoV-2 by FDA under an Emergency Use Authorization (EUA). This EUA will remain  in effect (meaning this test can be used) for the duration of the COVID-19 declaration under Se ction 564(b)(1) of the Act, 21 U.S.C. section 360bbb-3(b)(1), unless the authorization is terminated or revoked sooner.  Performed at [facility], [place] [address] [place], [place] [address]   MRSA Next Gen by PCR, Nasal     Status: None   Collection Time: 08/15/21  6:25 PM   Specimen: Nasal Mucosa; Nasal Swab  Result Value Ref Range Status   MRSA by PCR Next Gen NOT DETECTED NOT DETECTED Final    Comment: (  NOTE) The GeneXpert MRSA Assay (FDA approved for NASAL specimens only), is one component of a comprehensive MRSA colonization surveillance program. It is not intended to diagnose MRSA infection nor to guide or monitor treatment for MRSA infections. Test performance is not FDA approved in patients less than 66 years old. Performed at [facility], [address], [place], [place] [address]      Radiology Studies: No results found.  Scheduled Meds:  feeding supplement  237 mL Oral TID BM   ipratropium-albuterol  3 mL Nebulization Q6H   mirtazapine  7.5 mg Oral QHS   mometasone-formoterol  2 puff Inhalation BID   multivitamin with minerals  1 tablet Oral Daily   nortriptyline  100 mg Oral QHS   pantoprazole  40 mg Oral Daily   predniSONE  15 mg Oral Q breakfast   temazepam  15 mg Oral QHS   Continuous Infusions:  sodium chloride 10 mL/hr at 08/17/21 1200     LOS: 1 day   Time spent: 35 mins  Elvi Leventhal Wynetta Emery, MD How to contact the [facility] Attending or Consulting provider [place] or covering provider during after hours [place], for  this patient?  Check the care team in [facility] and look for a) attending/consulting [facility] provider listed and b) the [facility] team listed Log into [email or web address] and use [place]'s universal password to access. If you do not have the password, please contact the hospital operator. Locate the [facility] provider you are looking for under [facility] and page to a number that you can be directly reached. If you still have difficulty reaching the provider, please page the [facility] (Director on Call) for the Hospitalists listed on amion for assistance.  08/17/2021, 5:41 PM

## 2021-08-17 NOTE — Telephone Encounter (Signed)
Please see message from [place] about patient.

## 2021-08-17 NOTE — Progress Notes (Signed)
Palliative:  HPI: 70 y.o. male  with past medical history of malignant neoplasm of right lung (awaiting biopsy and ongoing work up outpatient with Dr. Delton Coombes), COPD, GERD, right BKA, COVID infection in August 2022 admitted on 08/14/2021 from [facility] long term care with decreased oral intake, weakness, increase respiratory rate and heart rate and found to have dehydration, hyponatremia, hyperglycemia, transaminitis, thrombocytopenia. Abd ultrasound with no acute findings and only cholelithiasis noted.    I met today at [name]'s bedside along with him and sister, [name]. [name] complains of pain after movement in bed and is awaiting pain medication. Pain medication is providing him with some relief but not adequate and not long enough - Dr. Wynetta Emery has already increased dosage which I agree with. He is actually more awake, alert and interactive today. He has been able to eat some of his lunch and a snack his sister brought him. He reports that he slept a little better last night. I encouraged him that he can have higher dose pain medication with nighttime medication and hopefully sleep even better tonight.   We further discussed goals of care. [name] reports that she was updated on conversation yesterday. [name] also recalls conversation from yesterday. He would like to continue to see how he feels about how to move forward. I again tried to empower him to make decisions that are best for him. I explained that if he feels that his health is in a place where he would not be able to tolerate treatment (or if he does not desire to go through treatment) then he can consider if he even wants to go through biopsy. He understands and will continue to consider his options.   All questions/concerns addressed. Emotional support provided.   Exam: Alert, oriented. Overall weak and fatigued although more energy than yesterday. Thin, frail.  Breathing regular, unlabored with congestion. Abd soft. Moves all  extremities.   Plan: - DNR decided yesterday.  - He continues to consider his options for pursuing biopsy/treatment (if an option) moving forward.   25 min  Vinie Sill, NP Palliative Medicine Team Pager [phone] (Please see [email or web address] for schedule) Team Phone [phone]    Greater than 50%  of this time was spent counseling and coordinating care related to the above assessment and plan

## 2021-08-17 NOTE — Progress Notes (Signed)
PT Cancellation Note  Patient Details Name: WALLACE GAPPA MRN: [id number] DOB: July 26, 1951   Cancelled Treatment:    Reason Eval/Treat Not Completed: Fatigue/lethargy limiting ability to participate 2nd attempt to complete PT.  Pt declined, with eyes shut stating he is too tired.  Pt educated on importance of moving to reduce further weakness and prevent pressure sores in bed.  Continued to decline therapy.  Ihor Austin, LPTA/CLT; CBIS [phone]  Aldona Lento 08/17/2021, 4:51 PM

## 2021-08-17 NOTE — Progress Notes (Signed)
PT Cancellation Note  Patient Details Name: [name] MRN: [id number] DOB: 06-06-1951   Cancelled Treatment:    Reason Eval/Treat Not Completed: Fatigue/lethargy limiting ability to participate  Attempted PT session.  Therapist entered room and pt stated he is tired and trying to rest, declined therapy this morning.  Will attempt later today if available. Ihor Austin, LPTA/CLT; CBIS [phone]  Aldona Lento 08/17/2021, 11:45 AM

## 2021-08-18 ENCOUNTER — Telehealth: Payer: Self-pay | Admitting: Family Medicine

## 2021-08-18 ENCOUNTER — Telehealth: Payer: Self-pay | Admitting: Pulmonary Disease

## 2021-08-18 DIAGNOSIS — J189 Pneumonia, unspecified organism: Secondary | ICD-10-CM | POA: Diagnosis present

## 2021-08-18 DIAGNOSIS — G8929 Other chronic pain: Secondary | ICD-10-CM | POA: Diagnosis not present

## 2021-08-18 DIAGNOSIS — G4700 Insomnia, unspecified: Secondary | ICD-10-CM | POA: Diagnosis not present

## 2021-08-18 DIAGNOSIS — J9811 Atelectasis: Secondary | ICD-10-CM | POA: Diagnosis present

## 2021-08-18 DIAGNOSIS — R2689 Other abnormalities of gait and mobility: Secondary | ICD-10-CM | POA: Diagnosis present

## 2021-08-18 DIAGNOSIS — E871 Hypo-osmolality and hyponatremia: Secondary | ICD-10-CM | POA: Diagnosis present

## 2021-08-18 DIAGNOSIS — Z7189 Other specified counseling: Secondary | ICD-10-CM | POA: Diagnosis not present

## 2021-08-18 DIAGNOSIS — R1312 Dysphagia, oropharyngeal phase: Secondary | ICD-10-CM | POA: Diagnosis present

## 2021-08-18 DIAGNOSIS — E876 Hypokalemia: Secondary | ICD-10-CM | POA: Diagnosis present

## 2021-08-18 DIAGNOSIS — R0602 Shortness of breath: Secondary | ICD-10-CM | POA: Diagnosis not present

## 2021-08-18 DIAGNOSIS — Z79899 Other long term (current) drug therapy: Secondary | ICD-10-CM | POA: Diagnosis not present

## 2021-08-18 DIAGNOSIS — C3431 Malignant neoplasm of lower lobe, right bronchus or lung: Secondary | ICD-10-CM | POA: Diagnosis present

## 2021-08-18 DIAGNOSIS — R918 Other nonspecific abnormal finding of lung field: Secondary | ICD-10-CM | POA: Diagnosis not present

## 2021-08-18 DIAGNOSIS — K219 Gastro-esophageal reflux disease without esophagitis: Secondary | ICD-10-CM | POA: Diagnosis present

## 2021-08-18 DIAGNOSIS — R042 Hemoptysis: Secondary | ICD-10-CM | POA: Diagnosis present

## 2021-08-18 DIAGNOSIS — Z66 Do not resuscitate: Secondary | ICD-10-CM | POA: Diagnosis present

## 2021-08-18 DIAGNOSIS — C3490 Malignant neoplasm of unspecified part of unspecified bronchus or lung: Secondary | ICD-10-CM | POA: Diagnosis present

## 2021-08-18 DIAGNOSIS — E872 Acidosis: Secondary | ICD-10-CM | POA: Diagnosis not present

## 2021-08-18 DIAGNOSIS — Z886 Allergy status to analgesic agent status: Secondary | ICD-10-CM | POA: Diagnosis not present

## 2021-08-18 DIAGNOSIS — M6281 Muscle weakness (generalized): Secondary | ICD-10-CM | POA: Diagnosis present

## 2021-08-18 DIAGNOSIS — J449 Chronic obstructive pulmonary disease, unspecified: Secondary | ICD-10-CM | POA: Diagnosis not present

## 2021-08-18 DIAGNOSIS — F419 Anxiety disorder, unspecified: Secondary | ICD-10-CM | POA: Diagnosis not present

## 2021-08-18 DIAGNOSIS — Z20822 Contact with and (suspected) exposure to covid-19: Secondary | ICD-10-CM | POA: Diagnosis present

## 2021-08-18 DIAGNOSIS — J439 Emphysema, unspecified: Secondary | ICD-10-CM | POA: Diagnosis not present

## 2021-08-18 DIAGNOSIS — Z89511 Acquired absence of right leg below knee: Secondary | ICD-10-CM | POA: Diagnosis not present

## 2021-08-18 DIAGNOSIS — I471 Supraventricular tachycardia: Secondary | ICD-10-CM | POA: Diagnosis present

## 2021-08-18 DIAGNOSIS — J9621 Acute and chronic respiratory failure with hypoxia: Secondary | ICD-10-CM | POA: Diagnosis not present

## 2021-08-18 DIAGNOSIS — E873 Alkalosis: Secondary | ICD-10-CM | POA: Diagnosis present

## 2021-08-18 DIAGNOSIS — D696 Thrombocytopenia, unspecified: Secondary | ICD-10-CM | POA: Diagnosis present

## 2021-08-18 DIAGNOSIS — Z515 Encounter for palliative care: Secondary | ICD-10-CM | POA: Diagnosis not present

## 2021-08-18 DIAGNOSIS — R7401 Elevation of levels of liver transaminase levels: Secondary | ICD-10-CM | POA: Diagnosis not present

## 2021-08-18 DIAGNOSIS — D61818 Other pancytopenia: Secondary | ICD-10-CM | POA: Diagnosis present

## 2021-08-18 DIAGNOSIS — J441 Chronic obstructive pulmonary disease with (acute) exacerbation: Secondary | ICD-10-CM | POA: Diagnosis present

## 2021-08-18 DIAGNOSIS — I700 Atherosclerosis of aorta: Secondary | ICD-10-CM | POA: Diagnosis present

## 2021-08-18 DIAGNOSIS — E43 Unspecified severe protein-calorie malnutrition: Secondary | ICD-10-CM | POA: Diagnosis not present

## 2021-08-18 DIAGNOSIS — Z8616 Personal history of COVID-19: Secondary | ICD-10-CM | POA: Diagnosis not present

## 2021-08-18 DIAGNOSIS — C7951 Secondary malignant neoplasm of bone: Secondary | ICD-10-CM | POA: Diagnosis present

## 2021-08-18 DIAGNOSIS — R739 Hyperglycemia, unspecified: Secondary | ICD-10-CM | POA: Diagnosis present

## 2021-08-18 DIAGNOSIS — F32A Depression, unspecified: Secondary | ICD-10-CM | POA: Diagnosis present

## 2021-08-18 DIAGNOSIS — C3491 Malignant neoplasm of unspecified part of right bronchus or lung: Secondary | ICD-10-CM | POA: Diagnosis not present

## 2021-08-18 DIAGNOSIS — R599 Enlarged lymph nodes, unspecified: Secondary | ICD-10-CM | POA: Diagnosis not present

## 2021-08-18 DIAGNOSIS — C7931 Secondary malignant neoplasm of brain: Secondary | ICD-10-CM | POA: Diagnosis present

## 2021-08-18 DIAGNOSIS — E860 Dehydration: Secondary | ICD-10-CM | POA: Diagnosis present

## 2021-08-18 DIAGNOSIS — R7402 Elevation of levels of lactic acid dehydrogenase (LDH): Secondary | ICD-10-CM | POA: Diagnosis present

## 2021-08-18 DIAGNOSIS — R590 Localized enlarged lymph nodes: Secondary | ICD-10-CM | POA: Diagnosis not present

## 2021-08-18 LAB — COMPREHENSIVE METABOLIC PANEL
ALT: 25 U/L (ref 0–44)
AST: 72 U/L — ABNORMAL HIGH (ref 15–41)
Albumin: 2.6 g/dL — ABNORMAL LOW (ref 3.5–5.0)
Alkaline Phosphatase: 172 U/L — ABNORMAL HIGH (ref 38–126)
Anion gap: 13 (ref 5–15)
BUN: 12 mg/dL (ref 8–23)
CO2: 32 mmol/L (ref 22–32)
Calcium: 9.5 mg/dL (ref 8.9–10.3)
Chloride: 92 mmol/L — ABNORMAL LOW (ref 98–111)
Creatinine, Ser: 0.58 mg/dL — ABNORMAL LOW (ref 0.61–1.24)
GFR, Estimated: >60 mL/min (ref >60)
Glucose, Bld: 132 mg/dL — ABNORMAL HIGH (ref 70–99)
Potassium: 3.5 mmol/L (ref 3.5–5.1)
Sodium: 137 mmol/L (ref 135–145)
Total Bilirubin: 0.7 mg/dL (ref 0.3–1.2)
Total Protein: 6.1 g/dL — ABNORMAL LOW (ref 6.5–8.1)

## 2021-08-18 LAB — CBC
HCT: 37.5 % — ABNORMAL LOW (ref 39.0–52.0)
Hemoglobin: 12.9 g/dL — ABNORMAL LOW (ref 13.0–17.0)
MCH: 30.2 pg (ref 26.0–34.0)
MCHC: 34.4 g/dL (ref 30.0–36.0)
MCV: 87.8 fL (ref 80.0–100.0)
Platelets: 41 10*3/uL — ABNORMAL LOW (ref 150–400)
RBC: 4.27 MIL/uL (ref 4.22–5.81)
RDW: 12.3 % (ref 11.5–15.5)
WBC: 3.8 10*3/uL — ABNORMAL LOW (ref 4.0–10.5)
nRBC: 1.6 % — ABNORMAL HIGH (ref 0.0–0.2)

## 2021-08-18 LAB — MAGNESIUM: Magnesium: 1.8 mg/dL (ref 1.7–2.4)

## 2021-08-18 MED ORDER — POLYETHYLENE GLYCOL 3350 17 G PO PACK: 17.0000 g | PACK | Freq: Every day | ORAL | 0 refills | Status: AC

## 2021-08-18 MED ORDER — SENNA 8.6 MG PO TABS: 1.0000 | ORAL_TABLET | Freq: Every day | ORAL | 0 refills | Status: AC | PRN

## 2021-08-18 MED ORDER — ALPRAZOLAM 0.5 MG PO TABS: 0.5000 mg | ORAL_TABLET | Freq: Three times a day (TID) | ORAL | 0 refills | Status: DC | PRN

## 2021-08-18 MED ORDER — OXYCODONE HCL 10 MG PO TABS: 10.0000 mg | ORAL_TABLET | Freq: Four times a day (QID) | ORAL | 0 refills | Status: AC

## 2021-08-18 MED ORDER — IPRATROPIUM-ALBUTEROL 0.5-2.5 (3) MG/3ML IN SOLN: 3.0000 mL | Freq: Four times a day (QID) | RESPIRATORY_TRACT | Status: DC

## 2021-08-18 MED ORDER — ENSURE ENLIVE PO LIQD: 237.0000 mL | Freq: Three times a day (TID) | ORAL | 12 refills | Status: AC

## 2021-08-18 MED ORDER — MIRTAZAPINE 15 MG PO TBDP: 7.5000 mg | ORAL_TABLET | Freq: Every day | ORAL | Status: AC

## 2021-08-18 MED ORDER — ADULT MULTIVITAMIN W/MINERALS CH: 1.0000 | ORAL_TABLET | Freq: Every day | ORAL | Status: AC

## 2021-08-18 NOTE — Telephone Encounter (Signed)
BI please advise. Patient currently in [facility]. Would you like to reschedule or we can make an appt for a Hospital F/U and decide then or can he have done while in hospital? Thanks :)

## 2021-08-18 NOTE — Telephone Encounter (Signed)
I believe this goes to [facility] as they schedule these procedures,  [facility], please advise. Thanks!

## 2021-08-18 NOTE — TOC Transition Note (Signed)
Transition of Care [facility]) - CM/SW Discharge Note   Patient Details  Name: [name] MRN: [id number] Date of Birth: August 15, 1951  [facility] CM/SW Contact:  Ihor Gully, LCSW Phone Number: 08/18/2021, 1:01 PM   Clinical Narrative:    D/c clinicals sent to facility. RN to call report. EMS contacted and will put patient on the transport list. TOC signing off.   Final next level of care: Skilled Nursing Facility Barriers to Discharge: No Barriers Identified   Patient Goals and CMS Choice     Choice offered to / list presented to : Sibling  Discharge Placement              Patient chooses bed at: Other - please specify in the comment section below: ([facility]) Patient to be transferred to facility by: [facility] Name of family member notified: VM left for [name], sister Patient and family notified of of transfer: 08/18/21  Discharge Plan and Services     Post Acute Care Choice: [facility] [facility]                               Social Determinants of Health (SDOH) Interventions     Readmission Risk Interventions No flowsheet data found.

## 2021-08-18 NOTE — Telephone Encounter (Signed)
We did not set this up will need to be done by nurse or provider thanks Joellen Jersey

## 2021-08-18 NOTE — Telephone Encounter (Signed)
Dr. Valeta Harms, the sister [name] stated she would bring him, do we need contact SNF for better transportation? Thanks!

## 2021-08-18 NOTE — Telephone Encounter (Signed)
Sister(Jean) has some concern about patient he has been discharge to [facility] but family wasn't given any information on why is he going their for rehab. She wanting to know his results. Will he need to keep specialist appointment even though he is in rehab. Will he be able to go back to [facility] or stay there. She states they didn't give her any information at hospital.please advise

## 2021-08-18 NOTE — Telephone Encounter (Signed)
I called and spoke with sister [name], who is on DPR, regarding discharge. [name] stated patient is getting D/C today from [facility] and going to a rehab facility. I informed sister that Dr. Valeta Harms wants him to keep appt for bronch if he is getting D/C today. Sister verbalized understanding and will have him. Will route to Dr. Valeta Harms for FYI and any additional recs.  Dr. Valeta Harms, just [facility] and advise if needing any additional recs. Thanks!

## 2021-08-18 NOTE — Discharge Instructions (Signed)
IMPORTANT INFORMATION: PAY CLOSE ATTENTION   PHYSICIAN DISCHARGE INSTRUCTIONS  Follow with Primary care provider  [name], MD  and other consultants as instructed by your Hospitalist Physician  [place] IF SYMPTOMS COME BACK, WORSEN OR NEW PROBLEM DEVELOPS   Please note: You were cared for by a hospitalist during your hospital stay. Every effort will be made to forward records to your primary care provider.  You can request that your primary care provider send for your hospital records if they have not received them.  Once you are discharged, your primary care physician will handle any further medical issues. Please note that NO REFILLS for any discharge medications will be authorized once you are discharged, as it is imperative that you return to your primary care physician (or establish a relationship with a primary care physician if you do not have one) for your post hospital discharge needs so that they can reassess your need for medications and monitor your lab values.  Please get a complete blood count and chemistry panel checked by your Primary MD at your next visit, and again as instructed by your Primary MD.  Get Medicines reviewed and adjusted: Please take all your medications with you for your next visit with your Primary MD  Laboratory/radiological data: Please request your Primary MD to go over all hospital tests and procedure/radiological results at the follow up, please ask your primary care provider to get all Hospital records sent to his/her office.  In some cases, they will be blood work, cultures and biopsy results pending at the time of your discharge. Please request that your primary care provider follow up on these results.  If you are diabetic, please bring your blood sugar readings with you to your follow up appointment with primary care.    Please call and make your follow up appointments as soon as possible.    Also Note  the following: If you experience worsening of your admission symptoms, develop shortness of breath, life threatening emergency, suicidal or homicidal thoughts you must seek medical attention immediately by calling 911 or calling your MD immediately  if symptoms less severe.  You must read complete instructions/literature along with all the possible adverse reactions/side effects for all the Medicines you take and that have been prescribed to you. Take any new Medicines after you have completely understood and accpet all the possible adverse reactions/side effects.   Do not drive when taking Pain medications or sleeping medications (Benzodiazepines)  Do not take more than prescribed Pain, Sleep and Anxiety Medications. It is not advisable to combine anxiety,sleep and pain medications without talking with your primary care practitioner  Special Instructions: If you have smoked or chewed Tobacco  in the last 2 yrs please stop smoking, stop any regular Alcohol  and or any Recreational drug use.  Wear Seat belts while driving.  Do not drive if taking any narcotic, mind altering or controlled substances or recreational drugs or alcohol.

## 2021-08-18 NOTE — Discharge Summary (Signed)
Physician Discharge Summary  [name] DVV:[id number] DOB: August 12, 1951 DOA: 08/14/2021  PCP: [name], MD Oncologist: Dr. Delton Coombes   Admit date: 08/14/2021 Discharge date: 08/18/2021  Admitted From:  HOME Disposition: SNF  Recommendations for Outpatient Follow-up:  Follow up with oncology as scheduled on 08/24/21 Please order outpatient palliative care.    Home Health:  Discharge Condition: stable but guarded   CODE STATUS: DNR DIET:  Dysphage 3   Brief Hospitalization Summary: Please see all hospital notes, images, labs for full details of the hospitalization. ADMISSION HPI: [name] is a 70 y.o. male with medical history significant for malignant neoplasm of right lung (follows with Dr. Delton Coombes), COPD, GERD, right BKA and COVID-19 in August who presents to the emergency department accompanied by sister at bedside due to being asked to go to the ED by his PCP because of increased fatigue and increased respiratory and heart rate.  Patient complained of decreased oral intake with subsequent worsening weakness, he followed up with his PCP today and was noted to be tachycardic and tachypneic in the office, so he was asked to go to the ED for further evaluation and for IV hydration due to dehydration.   ED Course:  In the emergency department, he was tachypneic and tachycardic, BP was 129/70 and O2 sat was  92% on room air.  He was reported to suddenly desaturates to 88-89% while sleeping, so was provided with supplemental oxygen via [place] at 2 LPM with improved oxygenation.  Work-up in the ED showed normal CBC except for thrombocytopenia, BMP shows hyponatremia, hyperglycemia.  Lactic acid 2.1 > 2.8 and transaminitis.  CT neck with contrast showed hyperenhancing mass adjacent to the left transverse process of C1 with expansion of the adjacent left obliquus capitis inferior muscle, most consistent with metastatic disease.  CT angiography chest with contrast showed no evidence of  pulmonary embolism but showed bulky right infrahilar/right lower lobe mass, corresponding to the patient's known primary bronchogenic neoplasm, unchanged from recent PET.  Chest x-ray showed improved opacities in the right middle lobe.  No new focal airspace disease Patient was provided with IV hydration.  Hospitalist was asked to admit patient.   Hospital Course by problem list  Dehydration -due to poor oral intake as a result of his cancer.  He responded well to IV fluid hydration however his overall prognosis remains poor.  DC IV fluid. Encourage oral intake.    Metastatic lung cancer-he has an appointment with Dr. Valeta Harms for possible biopsy in October.  He remains precontemplative at this time.  Appreciate palliative medicine consultation and recommendations.  Pain management ordered by palliative team.  Resume home pain management regimen.  PLEASE ORDER OUTPATIENT PALLIATIVE CARE as patient is rapidly declining.      DNR - as per discussion with palliative care team.     Anxiety disorder-appreciate orders provided by palliative care team for anxiolytic therapy.  Resumed home alprazolam.    Transaminitis-stable   Hyperglycemia-thought reactive. Stable.   Lactic acidosis-RESOLVED.    Thrombocytopenia-remains low, no active bleeding at this time, holding all heparin products continue to follow closely.   Hypomagnesemia-IV replacement ordered.   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      Dysphagia-dysphagia 3 diet ordered by SLP.  Aspiration precautions     DVT prophylaxis: SCDs Code Status: DNR  Family Communication: plan of care discussed with patient at bedside  Disposition: DC SNF Status is: INP  Discharge Diagnoses:  Active Problems:   Dehydration  Lactic acidosis   Thrombocytopenia (HCC)   Hyperglycemia   Transaminitis   Metastatic lung cancer (metastasis from lung to other site) (HCC)   Hyponatremia   Protein-calorie malnutrition,  severe   Discharge Instructions:  Allergies as of 08/18/2021       Reactions   Asa [aspirin] Other (See Comments)   Intolerance to aspirin due to ulcer   Benadryl [diphenhydramine Hcl] Other (See Comments)   Nervousness, insomnia        Medication List     STOP taking these medications    temazepam 15 MG capsule Commonly known as: RESTORIL       TAKE these medications    acetaminophen 500 MG tablet Commonly known as: TYLENOL Take 500 mg by mouth every 6 (six) hours as needed for mild pain, fever or headache.   ALPRAZolam 0.5 MG tablet Commonly known as: XANAX Take 1 tablet (0.5 mg total) by mouth 3 (three) times daily as needed for anxiety. What changed:  how much to take how to take this when to take this reasons to take this additional instructions   budesonide-formoterol 160-4.5 MCG/ACT inhaler Commonly known as: SYMBICORT Inhale 2 puffs into the lungs 2 (two) times daily.   feeding supplement Liqd Take 237 mLs by mouth 3 (three) times daily between meals.   hyoscyamine 0.125 MG SL tablet Commonly known as: LEVSIN SL PLACE 1 TABLET UNDER THE TONGUE EVERY 6 HOURS AS NEEDED FOR CRAMPS   ipratropium-albuterol 0.5-2.5 (3) MG/3ML Soln Commonly known as: DUONEB Take 3 mLs by nebulization every 6 (six) hours.   mirtazapine 15 MG disintegrating tablet Commonly known as: REMERON SOL-TAB Take 0.5 tablets (7.5 mg total) by mouth at bedtime.   multivitamin with minerals Tabs tablet Take 1 tablet by mouth daily. Start taking on: August 19, 2021   nortriptyline 50 MG capsule Commonly known as: PAMELOR Take 2 capsules (100 mg total) by mouth at bedtime.   omeprazole 40 MG capsule Commonly known as: PRILOSEC Take 1 capsule (40 mg total) by mouth daily.   Oxycodone HCl 10 MG Tabs Take 1 tablet (10 mg total) by mouth every 6 (six) hours for 3 days.   polyethylene glycol 17 g packet Commonly known as: MIRALAX / GLYCOLAX Take 17 g by mouth daily.    predniSONE 5 MG tablet Commonly known as: DELTASONE Take 15 mg by mouth daily with breakfast.   senna 8.6 MG Tabs tablet Commonly known as: SENOKOT Take 1 tablet (8.6 mg total) by mouth daily as needed for mild constipation.   Tussin DM 10-100 MG/5ML liquid Generic drug: dextromethorphan-guaiFENesin Take 5 mLs by mouth every 4 (four) hours as needed for cough.   Ventolin HFA 108 (90 Base) MCG/ACT inhaler Generic drug: albuterol INHALE 2 PUFFS INTO THE LUNGS EVERY 4 HOURS AS NEEDED FOR WHEEZING. What changed: See the new instructions.        Contact information for follow-up providers     Derek Jack, MD. Go on 08/24/2021.   Specialty: Hematology Why: as scheduled Contact information: [address] [place] [address] [phone]              Contact information for after-discharge care     [facility] .   Service: Skilled Nursing Contact information: [place] [phone]                    Allergies  Allergen Reactions  Asa [Aspirin] Other (See Comments)    Intolerance to aspirin due to ulcer   Benadryl [Diphenhydramine Hcl] Other (See Comments)    Nervousness, insomnia   Allergies as of 08/18/2021       Reactions   Asa [aspirin] Other (See Comments)   Intolerance to aspirin due to ulcer   Benadryl [diphenhydramine Hcl] Other (See Comments)   Nervousness, insomnia        Medication List     STOP taking these medications    temazepam 15 MG capsule Commonly known as: RESTORIL       TAKE these medications    acetaminophen 500 MG tablet Commonly known as: TYLENOL Take 500 mg by mouth every 6 (six) hours as needed for mild pain, fever or headache.   ALPRAZolam 0.5 MG tablet Commonly known as: XANAX Take 1 tablet (0.5 mg total) by mouth 3 (three) times daily as needed for anxiety. What changed:  how much to take how to take  this when to take this reasons to take this additional instructions   budesonide-formoterol 160-4.5 MCG/ACT inhaler Commonly known as: SYMBICORT Inhale 2 puffs into the lungs 2 (two) times daily.   feeding supplement Liqd Take 237 mLs by mouth 3 (three) times daily between meals.   hyoscyamine 0.125 MG SL tablet Commonly known as: LEVSIN SL PLACE 1 TABLET UNDER THE TONGUE EVERY 6 HOURS AS NEEDED FOR CRAMPS   ipratropium-albuterol 0.5-2.5 (3) MG/3ML Soln Commonly known as: DUONEB Take 3 mLs by nebulization every 6 (six) hours.   mirtazapine 15 MG disintegrating tablet Commonly known as: REMERON SOL-TAB Take 0.5 tablets (7.5 mg total) by mouth at bedtime.   multivitamin with minerals Tabs tablet Take 1 tablet by mouth daily. Start taking on: August 19, 2021   nortriptyline 50 MG capsule Commonly known as: PAMELOR Take 2 capsules (100 mg total) by mouth at bedtime.   omeprazole 40 MG capsule Commonly known as: PRILOSEC Take 1 capsule (40 mg total) by mouth daily.   Oxycodone HCl 10 MG Tabs Take 1 tablet (10 mg total) by mouth every 6 (six) hours for 3 days.   polyethylene glycol 17 g packet Commonly known as: MIRALAX / GLYCOLAX Take 17 g by mouth daily.   predniSONE 5 MG tablet Commonly known as: DELTASONE Take 15 mg by mouth daily with breakfast.   senna 8.6 MG Tabs tablet Commonly known as: SENOKOT Take 1 tablet (8.6 mg total) by mouth daily as needed for mild constipation.   Tussin DM 10-100 MG/5ML liquid Generic drug: dextromethorphan-guaiFENesin Take 5 mLs by mouth every 4 (four) hours as needed for cough.   Ventolin HFA 108 (90 Base) MCG/ACT inhaler Generic drug: albuterol INHALE 2 PUFFS INTO THE LUNGS EVERY 4 HOURS AS NEEDED FOR WHEEZING. What changed: See the new instructions.        Procedures/Studies: DG Chest 2 View  Result Date: 08/09/2021 CLINICAL DATA:  70 year old male with history of shortness of breath. History of non-small cell lung  cancer. EXAM: CHEST - 2 VIEW COMPARISON:  Chest x-ray 07/04/2021. FINDINGS: Ill-defined opacity in the right lower lobe compatible with residual airspace consolidation. Left lung is clear. No pneumothorax. No evidence of pulmonary edema. Extensive emphysematous changes are noted. Masslike fullness in the right hilar region and extensive thickening of the right paratracheal soft tissues again noted. IMPRESSION: 1. Right hilar and right paratracheal lymphadenopathy with some residual postobstructive pneumonia in the right lower lobe, similar to prior examinations. 2. Emphysema. Electronically Signed   By: Quillian Quince  Entrikin M.D.   On: 08/09/2021 21:40   CT Soft Tissue Neck W Contrast  Result Date: 08/15/2021 CLINICAL DATA:  Soft tissue mass of the neck EXAM: CT NECK WITH CONTRAST TECHNIQUE: Multidetector CT imaging of the neck was performed using the standard protocol following the bolus administration of intravenous contrast. CONTRAST:  136mL OMNIPAQUE IOHEXOL 350 MG/ML SOLN COMPARISON:  None. FINDINGS: PHARYNX AND LARYNX: The nasopharynx, oropharynx and larynx are normal. Visible portions of the oral cavity, tongue base and floor of mouth are normal. Normal epiglottis, vallecula and pyriform sinuses. The larynx is normal. No retropharyngeal abscess, effusion or lymphadenopathy. SALIVARY GLANDS: Normal parotid, submandibular and sublingual glands. THYROID: Normal. LYMPH NODES: Hyperenhancing mass adjacent to the left transverse process of C1 measuring 3.0 x 1.5 cm. There is expansion of the adjacent left obliquus capitis inferior muscle (3:31). The mass likely extends into the left C1 transverse foramen. VASCULAR: Major cervical vessels are patent. LIMITED INTRACRANIAL: Normal. VISUALIZED ORBITS: Normal. MASTOIDS AND VISUALIZED PARANASAL SINUSES: No fluid levels or advanced mucosal thickening. No mastoid effusion. SKELETON: No bony spinal canal stenosis. No lytic or blastic lesions. UPPER CHEST: Partially necrotic  mass of the upper mediastinum measures approximately 3.4 x 3.5 cm and is better characterized on concomitant CTA chest. There is emphysema. OTHER: None. IMPRESSION: 1. Hyperenhancing mass adjacent to the left transverse process of C1 with expansion of the adjacent left obliquus capitis inferior muscle, most consistent with metastatic disease. The mass likely extends into the left C1 transverse foramen but the left vertebral artery remains patent. 2. Partially necrotic mass of the upper mediastinum, better characterized on concomitant CTA chest. Emphysema (ICD10-J43.9). Electronically Signed   By: Ulyses Jarred M.D.   On: 08/15/2021 00:46   CT Chest W Contrast  Result Date: 07/21/2021 CLINICAL DATA:  Unintended weight loss. Left flank pain radiating to the left hip for 2 days. Coughing up blood for 2 weeks. 8 pound unintentional weight loss in 3 weeks. Loss of appetite. EXAM: CT CHEST, ABDOMEN, AND PELVIS WITH CONTRAST TECHNIQUE: Multidetector CT imaging of the chest, abdomen and pelvis was performed following the standard protocol during bolus administration of intravenous contrast. CONTRAST:  23mL OMNIPAQUE IOHEXOL 350 MG/ML SOLN COMPARISON:  12/02/2019 FINDINGS: CT CHEST FINDINGS Cardiovascular: Normal heart size. No pericardial effusions. Normal caliber thoracic aorta. Scattered aortic calcifications. No dissection. Mediastinum/Nodes: Prominent mediastinal lymphadenopathy, new since prior study. Heterogeneous appearance of the lymph nodes with lower attenuation areas consistent with necrosis. A right paratracheal lymph node measures about 3.6 cm in diameter. Subcarinal lymph node measures about 2.4 cm short axis dimension. Esophagus is decompressed. Small esophageal hiatal hernia. Lungs/Pleura: There is a mass lesion arising in or around the right bronchus intermedius, significantly narrowing the bronchus intermedius, and measuring about 3.3 x 4.1 cm. This is likely primary bronchogenic carcinoma. Less likely  consideration would be extrinsic compression due to lymphadenopathy, either metastatic or lymphoma. Additional enlarged right hilar lymph nodes are present, measuring up to 2.2 cm diameter. The mass and hilar lymphadenopathy are new since the previous study. Mild patchy coarse infiltrative areas demonstrated in the right middle lobe and right lower lobe probably represent early postobstructive change. Chronic fibrosis related to previous COVID-19 could also have this appearance. Diffuse emphysematous changes in the lungs. No pleural effusions. No pneumothorax. Musculoskeletal: Mild anterior compression of T11 without a destructive lesions apparent. No focal bone lesion or bone destruction. Mild degenerative changes in the spine. CT ABDOMEN PELVIS FINDINGS Hepatobiliary: No focal liver lesions. Portal veins are patent.  Several small stones in the gallbladder. No gallbladder wall thickening or inflammation. No bile duct dilatation. Pancreas: Unremarkable. No pancreatic ductal dilatation or surrounding inflammatory changes. Spleen: Normal in size without focal abnormality. Adrenals/Urinary Tract: Adrenal glands are unremarkable. Kidneys are normal, without renal calculi, focal lesion, or hydronephrosis. Bladder is unremarkable. Stomach/Bowel: Stomach is within normal limits. Appendix appears normal. No evidence of bowel wall thickening, distention, or inflammatory changes. Vascular/Lymphatic: Scattered retroperitoneal lymph nodes are not pathologically enlarged. Nodular prominence of the crus of the diaphragm is unchanged since prior study, likely normal variation. Calcification of the aorta. No aneurysm. Reproductive: Prostate is unremarkable. Other: No free air or free fluid in the abdomen. Abdominal wall musculature appears intact. Musculoskeletal: Mild inferior endplate compression of L1. Degenerative changes most prominent at L5-S1. Degenerative changes in the hips. Productive changes and cortical irregularities  in the iliac bones bilaterally likely representing postoperative change. No definite bone metastasis. Old fracture deformities of the pubic rami. IMPRESSION: 1. 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 Patchy postobstructive changes in the right middle lobe and right lower lung. 2. No evidence of distant metastatic disease in the abdomen or pelvis. 3. Diffuse emphysematous changes in the lungs. 4. Cholelithiasis. 5. Aortic atherosclerosis. Electronically Signed   By: Lucienne Capers M.D.   On: 07/21/2021 19:18   CT Angio Chest PE W and/or Wo Contrast  Result Date: 08/15/2021 CLINICAL DATA:  Tachycardia, weakness, dehydration.  Lung cancer. EXAM: CT ANGIOGRAPHY CHEST WITH CONTRAST TECHNIQUE: Multidetector CT imaging of the chest was performed using the standard protocol during bolus administration of intravenous contrast. Multiplanar CT image reconstructions and MIPs were obtained to evaluate the vascular anatomy. CONTRAST:  131mL OMNIPAQUE IOHEXOL 350 MG/ML SOLN COMPARISON:  PET-CT dated 08/03/2021 FINDINGS: Cardiovascular: Satisfactory opacification the bilateral pulmonary arteries to the segmental level. No evidence of pulmonary embolism. Although not tailored for evaluation of the thoracic aorta, there is no evidence of thoracic aortic aneurysm or dissection. Heart is normal in size.  No pericardial effusion. Coronary atherosclerosis of the LAD and right coronary artery. Mediastinum/Nodes: Bulky mediastinal lymphadenopathy, unchanged from recent PET. Dominant right paratracheal node measures 3.1 cm short axis (series 5/image 28). Visualized thyroid is unremarkable. Lungs/Pleura: Bulky right infrahilar/right lower lobe mass narrowing the right lower lobe bronchus, measuring 4.1 x 6.4 cm (series 5/image 46), unchanged from recent PET. Mild postobstructive opacity  in the right lower lobe (series 7/image 102). Moderate centrilobular and paraseptal emphysematous changes, upper lung predominant. No pleural effusion or pneumothorax. Upper Abdomen: Visualized upper abdomen is notable for a small hiatal hernia. Musculoskeletal: Multifocal lytic metastases, including the right sternum (series 5/image 12) and the left T7 transverse process (series 5/image 40). Additional vertebral body lesions are better evaluated on recent PET. Review of the MIP images confirms the above findings. IMPRESSION: No evidence of pulmonary embolism. Bulky right infrahilar/right lower lobe mass, corresponding to the patient's known primary bronchogenic neoplasm, unchanged from recent PET. Bulky mediastinal lymphadenopathy. Multifocal osseous metastases, better evaluated on recent PET. Aortic Atherosclerosis (ICD10-I70.0) and Emphysema (ICD10-J43.9). Electronically Signed   By: Gerald Hy M.D.   On: 08/15/2021 00:36   MR Brain W Wo Contrast  Result Date: 08/05/2021 CLINICAL DATA:  Malignant neoplasm of unspecified part of unspecified bronchus or lung. Non-small-cell lung cancer, staging. EXAM: MRI HEAD WITHOUT AND WITH CONTRAST TECHNIQUE: Multiplanar, multiecho pulse sequences of the brain and surrounding structures were obtained without and  with intravenous contrast. CONTRAST:  7.69mL GADAVIST GADOBUTROL 1 MMOL/ML IV SOLN COMPARISON:  PET-CT 08/03/2021. brain MRI 04/30/2015. FINDINGS: Brain: Mild intermittent motion degradation. Mild generalized cerebral and cerebellar atrophy. Mild multifocal T2/FLAIR hyperintensity within the cerebral white matter, nonspecific but compatible with chronic small vessel ischemic disease. There is no acute infarct. No evidence of an intracranial mass. No chronic intracranial blood products. No extra-axial fluid collection. No midline shift. No pathologic intracranial enhancement to suggest intracranial metastatic disease. Vascular: Maintained flow voids within the  proximal large arterial vessels. Skull and upper cervical spine: Osseous and/or soft tissue mass in the region of the left C1 lateral mass (series 7, image 16). Hypermetabolism was present at this site on the prior PET-CT of 08/03/2021. Ill-defined focus of enhancement within the left parietal calvarium, which could reflect a focus of osseous metastatic disease (for instance as seen on series 18, image 127). Sinuses/Orbits: Visualized orbits show no acute finding. Frothy secretions and mild mucosal thickening within the left sphenoid sinus. Mild mucosal thickening within the right ethmoid and maxillary sinuses. IMPRESSION: Motion degraded exam. No evidence of intracranial metastatic disease. Osseous and/or soft tissue mass in the region of the left C1 lateral mass. Hypermetabolism was present at this site on the prior PET-CT of 08/03/2021. A contrast-enhanced cervical spine MRI should be considered for further evaluation. Ill-defined focus of enhancement within the left parietal calvarium, nonspecific but possibly reflecting a site of osseous metastatic disease. Mild chronic small-vessel ischemic changes within the cerebral white matter. Mild generalized parenchymal atrophy. Paranasal sinus disease, as described. Correlate for acute sinusitis Electronically Signed   By: Kellie Simmering D.O.   On: 08/05/2021 14:37   CT Abdomen Pelvis W Contrast  Result Date: 07/21/2021 CLINICAL DATA:  Unintended weight loss. Left flank pain radiating to the left hip for 2 days. Coughing up blood for 2 weeks. 8 pound unintentional weight loss in 3 weeks. Loss of appetite. EXAM: CT CHEST, ABDOMEN, AND PELVIS WITH CONTRAST TECHNIQUE: Multidetector CT imaging of the chest, abdomen and pelvis was performed following the standard protocol during bolus administration of intravenous contrast. CONTRAST:  25mL OMNIPAQUE IOHEXOL 350 MG/ML SOLN COMPARISON:  12/02/2019 FINDINGS: CT CHEST FINDINGS Cardiovascular: Normal heart size. No pericardial  effusions. Normal caliber thoracic aorta. Scattered aortic calcifications. No dissection. Mediastinum/Nodes: Prominent mediastinal lymphadenopathy, new since prior study. Heterogeneous appearance of the lymph nodes with lower attenuation areas consistent with necrosis. A right paratracheal lymph node measures about 3.6 cm in diameter. Subcarinal lymph node measures about 2.4 cm short axis dimension. Esophagus is decompressed. Small esophageal hiatal hernia. Lungs/Pleura: There is a mass lesion arising in or around the right bronchus intermedius, significantly narrowing the bronchus intermedius, and measuring about 3.3 x 4.1 cm. This is likely primary bronchogenic carcinoma. Less likely consideration would be extrinsic compression due to lymphadenopathy, either metastatic or lymphoma. Additional enlarged right hilar lymph nodes are present, measuring up to 2.2 cm diameter. The mass and hilar lymphadenopathy are new since the previous study. Mild patchy coarse infiltrative areas demonstrated in the right middle lobe and right lower lobe probably represent early postobstructive change. Chronic fibrosis related to previous COVID-19 could also have this appearance. Diffuse emphysematous changes in the lungs. No pleural effusions. No pneumothorax. Musculoskeletal: Mild anterior compression of T11 without a destructive lesions apparent. No focal bone lesion or bone destruction. Mild degenerative changes in the spine. CT ABDOMEN PELVIS FINDINGS Hepatobiliary: No focal liver lesions. Portal veins are patent. Several small stones in the gallbladder. No gallbladder wall  thickening or inflammation. No bile duct dilatation. Pancreas: Unremarkable. No pancreatic ductal dilatation or surrounding inflammatory changes. Spleen: Normal in size without focal abnormality. Adrenals/Urinary Tract: Adrenal glands are unremarkable. Kidneys are normal, without renal calculi, focal lesion, or hydronephrosis. Bladder is unremarkable.  Stomach/Bowel: Stomach is within normal limits. Appendix appears normal. No evidence of bowel wall thickening, distention, or inflammatory changes. Vascular/Lymphatic: Scattered retroperitoneal lymph nodes are not pathologically enlarged. Nodular prominence of the crus of the diaphragm is unchanged since prior study, likely normal variation. Calcification of the aorta. No aneurysm. Reproductive: Prostate is unremarkable. Other: No free air or free fluid in the abdomen. Abdominal wall musculature appears intact. Musculoskeletal: Mild inferior endplate compression of L1. Degenerative changes most prominent at L5-S1. Degenerative changes in the hips. Productive changes and cortical irregularities in the iliac bones bilaterally likely representing postoperative change. No definite bone metastasis. Old fracture deformities of the pubic rami. IMPRESSION: 1. 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 Patchy postobstructive changes in the right middle lobe and right lower lung. 2. No evidence of distant metastatic disease in the abdomen or pelvis. 3. Diffuse emphysematous changes in the lungs. 4. Cholelithiasis. 5. Aortic atherosclerosis. Electronically Signed   By: Lucienne Capers M.D.   On: 07/21/2021 19:18   NM PET Image Initial (PI) Skull Base To Thigh (F-18 FDG)  Result Date: 08/05/2021 CLINICAL DATA:  Initial treatment strategy for non-small cell lung cancer. EXAM: NUCLEAR MEDICINE PET SKULL BASE TO THIGH TECHNIQUE: 8.58 mCi F-18 FDG was injected intravenously. Full-ring PET imaging was performed from the skull base to thigh after the radiotracer. CT data was obtained and used for attenuation correction and anatomic localization. Fasting blood glucose: 151 mg/dl COMPARISON:  CT scan 07/21/2021 FINDINGS: Mediastinal blood pool activity: SUV max 2.14 Liver activity: SUV  max NA NECK: No hypermetabolic lymph nodes in the neck. Incidental CT findings: none CHEST: The right hilar/central lung mass is hypermetabolic with SUV max of 4.48. Associated right hilar, right paratracheal and subcarinal lymphadenopathy. The partially necrotic right paratracheal node has an SUV max of 7.99. No supraclavicular or axillary adenopathy. Mild hypermetabolism in the atelectatic right lower lobe is likely postobstructive pneumonitis. No findings metastatic pulmonary nodules. Incidental CT findings: Stable underlying emphysematous changes and pulmonary scarring. Stable scattered atherosclerotic calcifications involving the aorta and coronary arteries. ABDOMEN/PELVIS: No findings to suggest abdominal/pelvic metastatic disease. No hepatic adrenal gland lesions or. Incidental CT findings: Numerous small layering gallstones are noted in the gallbladder. Moderate scattered arterial calcifications. SKELETON: Diffuse osseous metastatic disease. There is a lytic lesion involving the right proximal humeral shaft which has an SUV max 8.35. Right sternal manubrial lesion is destroying the posterior cortex. SUV max is 7.76. There is also a lytic left-sided sternal manubrial lesion with SUV max of 28.7. Destructive lytic lesion involving the transverse process of T7 vertebral body has an SUV max of 6.80 Several vertebral body lesions are noted but no spinal canal compromise. Right hip and proximal femoral lesions have an SUV max 7.62 Incidental CT findings: none IMPRESSION: 1. Hypermetabolic right hilar mass consistent with known primary neoplasm. Associated right paratracheal and subcarinal lymphadenopathy. 2. No findings for abdominal/pelvic metastatic disease. 3. Diffuse osseous metastatic disease as detailed above. Electronically Signed   By: Marijo Sanes M.D.   On: 08/05/2021 11:15   DG Chest Port 1 View  Result Date: 08/14/2021 CLINICAL DATA:  Hypoxia, shortness of breath, recent  COVID. Known lung cancer  EXAM: PORTABLE CHEST 1 VIEW COMPARISON:  Chest radiograph 08/09/2021 FINDINGS: The cardiomediastinal silhouette is stable, allowing for significant rightward patient rotation. Increased interstitial markings seen throughout both lungs are grossly similar. Opacity in the right lower lobe has improved. There is no new focal airspace disease. There is no pleural effusion. There is no pneumothorax. The known right hilar mass and mediastinal lymphadenopathy are not well evaluated partially due to patient rotation. There is no acute osseous abnormality. IMPRESSION: 1. Improved opacities in the right middle lobe. No new focal airspace disease. 2. The known right hilar mass and associated lymphadenopathy are not well evaluated. Electronically Signed   By: Valetta Mole M.D.   On: 08/14/2021 17:30   DG HIP UNILAT WITH PELVIS 2-3 VIEWS LEFT  Result Date: 07/25/2021 CLINICAL DATA:  Left hip pain. EXAM: DG HIP (WITH OR WITHOUT PELVIS) 2-3V LEFT COMPARISON:  CT 09/20/2021. FINDINGS: Stable iliac crest bony irregularities again possibly postsurgical. Old right superior inferior pubic rami fractures again noted. Degenerative change lumbar spine and both hips again noted. No acute bony or joint abnormality identified. Left hip is intact. IMPRESSION: Degenerative changes lumbar spine and both hips again noted. Old right superior inferior pubic rami fractures again noted. No acute bony abnormality identified. Left hip is intact. Electronically Signed   By: Marcello Moores  Register M.D.   On: 07/25/2021 11:00   US Abdomen Limited RUQ (LIVER/GB)  Result Date: 08/15/2021 CLINICAL DATA:  Generalized abdominal pain x3 weeks EXAM: ULTRASOUND ABDOMEN LIMITED RIGHT UPPER QUADRANT COMPARISON:  None. FINDINGS: Gallbladder: Sludge and mobile gallbladder stones are seen within the gallbladder fundus. The gallbladder, however, is not distended, there is no gallbladder wall thickening, and no pericholecystic fluid is identified. The sonographic  Percell Miller sign is reportedly negative. Common bile duct: Diameter: 4 mm in proximal diameter Liver: No focal lesion identified. Within normal limits in parenchymal echogenicity. Portal vein is patent on color Doppler imaging with normal direction of blood flow towards the liver. Other: None. IMPRESSION: Cholelithiasis without sonographic evidence of acute cholecystitis. Electronically Signed   By: Fidela Salisbury M.D.   On: 08/15/2021 12:05     Subjective: Pt reports being too weak to go home but agreeable to SNF placement.   Discharge Exam: Vitals:   08/18/21 0736 08/18/21 0753  BP:    Pulse:    Resp:    Temp:    SpO2: (!) 76% 93%   Vitals:   08/18/21 0142 08/18/21 0331 08/18/21 0736 08/18/21 0753  BP:  127/76    Pulse:  (!) 124    Resp:  20    Temp:  98.4 F (36.9 C)    TempSrc:  Oral    SpO2: 93% 90% (!) 76% 93%  Weight:      Height:       General: Pt is alert, awake, not in acute distress Cardiovascular: RRR, S1/S2 +, no rubs, no gallops Respiratory: bilateral BS but wheezing heard on right posteriorly Abdominal: Soft, NT, ND, bowel sounds + Extremities: no edema, no cyanosis   The results of significant diagnostics from this hospitalization (including imaging, microbiology, ancillary and laboratory) are listed below for reference.     Microbiology: Recent Results (from the past 240 hour(s))  SARS CORONAVIRUS 2 (TAT 6-24 HRS) Nasopharyngeal Nasopharyngeal Swab     Status: None   Collection Time: 08/15/21  1:55 AM   Specimen: Nasopharyngeal Swab  Result Value Ref Range Status   SARS Coronavirus 2 NEGATIVE NEGATIVE Final  Comment: (NOTE) SARS-CoV-2 target nucleic acids are NOT DETECTED.  The SARS-CoV-2 RNA is generally detectable in upper and lower respiratory specimens during the acute phase of infection. Negative results do not preclude SARS-CoV-2 infection, do not rule out co-infections with other pathogens, and should not be used as the sole basis for treatment or  other patient management decisions. Negative results must be combined with clinical observations, patient history, and epidemiological information. The expected result is Negative.  Fact Sheet for Patients: SugarRoll.be  Fact Sheet for Healthcare Providers: https://www.woods-mathews.com/  This test is not yet approved or cleared by the [place] FDA and  has been authorized for detection and/or diagnosis of SARS-CoV-2 by FDA under an Emergency Use Authorization (EUA). This EUA will remain  in effect (meaning this test can be used) for the duration of the COVID-19 declaration under Se ction 564(b)(1) of the Act, 21 U.S.C. section 360bbb-3(b)(1), unless the authorization is terminated or revoked sooner.  Performed at [facility], [place] [address] [place], [place] [address]   MRSA Next Gen by PCR, Nasal     Status: None   Collection Time: 08/15/21  6:25 PM   Specimen: Nasal Mucosa; Nasal Swab  Result Value Ref Range Status   MRSA by PCR Next Gen NOT DETECTED NOT DETECTED Final    Comment: (NOTE) The GeneXpert MRSA Assay (FDA approved for NASAL specimens only), is one component of a comprehensive MRSA colonization surveillance program. It is not intended to diagnose MRSA infection nor to guide or monitor treatment for MRSA infections. Test performance is not FDA approved in patients less than 50 years old. Performed at [facility], [address] [place], [place] [address]      Labs: BNP (last 3 results) No results for input(s): BNP in the last 8760 hours. Basic Metabolic Panel: Recent Labs  Lab 08/14/21 1741 08/15/21 0429 08/16/21 0544 08/17/21 0531 08/18/21 0621  NA 131* 135 137 136 137  K 4.5 4.4 3.5 3.3* 3.5  CL 90* 92* 94* 93* 92*  CO2 29 32 31 31 32  GLUCOSE 142* 120* 109* 107* 132*  BUN 28* 21 11 8 12   CREATININE 0.93 0.75 0.55* 0.60* 0.58*  CALCIUM 9.6 8.7* 9.0 8.7* 9.5  MG 2.1 1.8 1.6* 1.9 1.8  PHOS 5.0*  4.1  --   --   --    Liver Function Tests: Recent Labs  Lab 08/14/21 1741 08/15/21 0429 08/16/21 0544 08/17/21 0531 08/18/21 0621  AST 58* 54* 49* 54* 72*  ALT 34 29 28 24 25   ALKPHOS 256* 213* 176* 154* 172*  BILITOT 0.9 1.0 0.9 1.0 0.7  PROT 7.1 6.2* 5.9* 5.8* 6.1*  ALBUMIN 3.0* 2.6* 2.5* 2.5* 2.6*   No results for input(s): LIPASE, AMYLASE in the last 168 hours. No results for input(s): AMMONIA in the last 168 hours. CBC: Recent Labs  Lab 08/14/21 1741 08/15/21 0429 08/16/21 0544 08/17/21 0531 08/18/21 0621  WBC 5.6 5.0 4.2 3.6* 3.8*  NEUTROABS 3.0  --   --   --   --   HGB 15.0 13.4 12.6* 11.9* 12.9*  HCT 44.1 39.5 37.3* 35.0* 37.5*  MCV 89.3 89.2 88.6 88.6 87.8  PLT 56* 51* 43* 40* 41*   Cardiac Enzymes: No results for input(s): CKTOTAL, CKMB, CKMBINDEX, TROPONINI in the last 168 hours. BNP: Invalid input(s): POCBNP CBG: No results for input(s): GLUCAP in the last 168 hours. D-Dimer No results for input(s): DDIMER in the last 72 hours. Hgb A1c No results  for input(s): HGBA1C in the last 72 hours. Lipid Profile No results for input(s): CHOL, HDL, LDLCALC, TRIG, CHOLHDL, LDLDIRECT in the last 72 hours. Thyroid function studies No results for input(s): TSH, T4TOTAL, T3FREE, THYROIDAB in the last 72 hours.  Invalid input(s): FREET3 Anemia work up No results for input(s): VITAMINB12, FOLATE, FERRITIN, TIBC, IRON, RETICCTPCT in the last 72 hours. Urinalysis    Component Value Date/Time   COLORURINE YELLOW 04/29/2015 2123   APPEARANCEUR CLEAR 04/29/2015 2123   LABSPEC 1.010 04/29/2015 2123   PHURINE 6.0 04/29/2015 2123   GLUCOSEU NEGATIVE 04/29/2015 2123   HGBUR NEGATIVE 04/29/2015 2123   BILIRUBINUR NEGATIVE 04/29/2015 2123   KETONESUR TRACE (A) 04/29/2015 2123   PROTEINUR NEGATIVE 04/29/2015 2123   UROBILINOGEN 0.2 04/29/2015 2123   NITRITE NEGATIVE 04/29/2015 2123   LEUKOCYTESUR NEGATIVE 04/29/2015 2123   Sepsis Labs Invalid input(s):  PROCALCITONIN,  WBC,  LACTICIDVEN Microbiology Recent Results (from the past 240 hour(s))  SARS CORONAVIRUS 2 (TAT 6-24 HRS) Nasopharyngeal Nasopharyngeal Swab     Status: None   Collection Time: 08/15/21  1:55 AM   Specimen: Nasopharyngeal Swab  Result Value Ref Range Status   SARS Coronavirus 2 NEGATIVE NEGATIVE Final    Comment: (NOTE) SARS-CoV-2 target nucleic acids are NOT DETECTED.  The SARS-CoV-2 RNA is generally detectable in upper and lower respiratory specimens during the acute phase of infection. Negative results do not preclude SARS-CoV-2 infection, do not rule out co-infections with other pathogens, and should not be used as the sole basis for treatment or other patient management decisions. Negative results must be combined with clinical observations, patient history, and epidemiological information. The expected result is Negative.  Fact Sheet for Patients: SugarRoll.be  Fact Sheet for Healthcare Providers: https://www.woods-mathews.com/  This test is not yet approved or cleared by the [place] FDA and  has been authorized for detection and/or diagnosis of SARS-CoV-2 by FDA under an Emergency Use Authorization (EUA). This EUA will remain  in effect (meaning this test can be used) for the duration of the COVID-19 declaration under Se ction 564(b)(1) of the Act, 21 U.S.C. section 360bbb-3(b)(1), unless the authorization is terminated or revoked sooner.  Performed at [facility], [place] [address], [place], [place] [address]   MRSA Next Gen by PCR, Nasal     Status: None   Collection Time: 08/15/21  6:25 PM   Specimen: Nasal Mucosa; Nasal Swab  Result Value Ref Range Status   MRSA by PCR Next Gen NOT DETECTED NOT DETECTED Final    Comment: (NOTE) The GeneXpert MRSA Assay (FDA approved for NASAL specimens only), is one component of a comprehensive MRSA colonization surveillance program. It is not intended to  diagnose MRSA infection nor to guide or monitor treatment for MRSA infections. Test performance is not FDA approved in patients less than 31 years old. Performed at [facility], [address] [place], [place] [address]     Time coordinating discharge: 38 mins   SIGNED:  Irwin Brakeman, MD  [facility] 08/18/2021, 11:16 AM How to contact the [facility] Attending or Consulting provider [place] or covering provider during after hours [place], for this patient?  Check the care team in [facility] and look for a) attending/consulting [facility] provider listed and b) the [facility] team listed Log into [email or web address] and use [place]'s universal password to access. If you do not have the password, please contact the hospital operator. Locate the [facility] provider you are looking for under [facility] and  page to a number that you can be directly reached. If you still have difficulty reaching the provider, please page the [facility] (Director on Call) for the Hospitalists listed on amion for assistance.

## 2021-08-18 NOTE — Plan of Care (Signed)

## 2021-08-18 NOTE — Telephone Encounter (Signed)
[name](DPR) advised per Dr Nicki Reaper: Dr Nicki Reaper was made aware of this phone call this afternoon Dr Nicki Reaper is out of the office currently Based upon what Dr Nicki Reaper is able to look at on the electronic record it looks like they felt he was too weak to be able to do any type of return to [facility] at this present moment.  This is a very concerning situation more so because of Aero's weakness   Dr Nicki Reaper am sure while he is at [facility] they will try to encourage him to eat and drink.  In addition to this more than likely do some physical therapy.  If he makes improvement it is able to improve over the next week or 2 weeks he may be able to get back to the point of going back to his assisted living.  It is hard to predict currently.  Dr Nicki Reaper can drop by [facility] on Monday to see how things are going.  Dr Nicki Reaper does not have admitting privileges and Dr Nicki Reaper is not on staff there so he is not allowed to write any orders for him there.   Certainly he can still see the oncologist on the [facility], [facility] should arrange for him to get there or potentially family can get him there.  [name] verbalized understanding.

## 2021-08-18 NOTE — Care Management Important Message (Signed)
Important Message  Patient Details  Name: [name] MRN: [id number] Date of Birth: 1951-10-17   Medicare Important Message Given:  Yes     Tommy Medal 08/18/2021, 2:49 PM

## 2021-08-18 NOTE — Telephone Encounter (Signed)
I was made aware of this phone call this afternoon I am out of the office currently Based upon what I am able to look at on the electronic record it looks like they felt he was too weak to be able to do any type of return to [facility] at this present moment.  This is a very concerning situation more so because of Edi's weakness  I am sure while he is at [place] they will try to encourage him to eat and drink.  In addition to this more than likely do some physical therapy.  If he makes improvement it is able to improve over the next week or 2 weeks he may be able to get back to the point of going back to his assisted living.  It is hard to predict currently.  I can drop by [facility] on Monday to see how things are going.  I do not have admitting privileges and I am not on staff there so I am not allowed to write any orders for him there.  Certainly he can still see the oncologist on the [facility], [facility] should arrange for him to get there or potentially family can get him there.  Please send this message back to me I will reach out to [facility] Monday evening at the latest-thanks-Dr. Nicki Reaper

## 2021-08-18 NOTE — Plan of Care (Signed)

## 2021-08-21 ENCOUNTER — Other Ambulatory Visit (HOSPITAL_COMMUNITY): Payer: Self-pay

## 2021-08-21 ENCOUNTER — Encounter (HOSPITAL_COMMUNITY): Payer: Self-pay | Admitting: Pulmonary Disease

## 2021-08-21 ENCOUNTER — Telehealth: Payer: Self-pay | Admitting: Pulmonary Disease

## 2021-08-21 DIAGNOSIS — E860 Dehydration: Secondary | ICD-10-CM | POA: Diagnosis not present

## 2021-08-21 DIAGNOSIS — K219 Gastro-esophageal reflux disease without esophagitis: Secondary | ICD-10-CM | POA: Diagnosis not present

## 2021-08-21 DIAGNOSIS — Z79899 Other long term (current) drug therapy: Secondary | ICD-10-CM | POA: Diagnosis not present

## 2021-08-21 DIAGNOSIS — J449 Chronic obstructive pulmonary disease, unspecified: Secondary | ICD-10-CM | POA: Diagnosis not present

## 2021-08-21 MED ORDER — MEGESTROL ACETATE 400 MG/10ML PO SUSP: 400.0000 mg | Freq: Two times a day (BID) | ORAL | 0 refills | Status: AC

## 2021-08-21 NOTE — Telephone Encounter (Signed)
Spoke to [employer] with [employer]. Delcie Roch stated that she no longer needs instructions as her coworker made her aware of arrival time.  Nothing further needed at this time.

## 2021-08-21 NOTE — Telephone Encounter (Signed)
Megace 400mg  BID sent to [facility] per Dr. Delton Coombes verbal order.

## 2021-08-22 ENCOUNTER — Inpatient Hospital Stay (HOSPITAL_COMMUNITY)
Admission: RE | Admit: 2021-08-22 | Discharge: 2021-08-31 | DRG: 193 | Disposition: A | Discharge status: Hospice | Payer: Medicare Other | Attending: Family Medicine | Admitting: Family Medicine

## 2021-08-22 ENCOUNTER — Encounter (HOSPITAL_COMMUNITY): Payer: Self-pay | Admitting: Anesthesiology

## 2021-08-22 ENCOUNTER — Inpatient Hospital Stay (HOSPITAL_COMMUNITY): Payer: Medicare Other

## 2021-08-22 ENCOUNTER — Other Ambulatory Visit: Payer: Self-pay

## 2021-08-22 ENCOUNTER — Encounter (HOSPITAL_COMMUNITY)
Admission: RE | Disposition: A | Discharge status: Hospice | Payer: Self-pay | Source: Home / Self Care | Attending: Internal Medicine

## 2021-08-22 ENCOUNTER — Encounter (HOSPITAL_COMMUNITY): Payer: Self-pay | Admitting: Internal Medicine

## 2021-08-22 ENCOUNTER — Other Ambulatory Visit: Payer: Self-pay | Admitting: Family Medicine

## 2021-08-22 DIAGNOSIS — R918 Other nonspecific abnormal finding of lung field: Secondary | ICD-10-CM | POA: Insufficient documentation

## 2021-08-22 DIAGNOSIS — J9811 Atelectasis: Secondary | ICD-10-CM | POA: Diagnosis present

## 2021-08-22 DIAGNOSIS — F32A Depression, unspecified: Secondary | ICD-10-CM | POA: Diagnosis present

## 2021-08-22 DIAGNOSIS — K219 Gastro-esophageal reflux disease without esophagitis: Secondary | ICD-10-CM | POA: Diagnosis present

## 2021-08-22 DIAGNOSIS — C7951 Secondary malignant neoplasm of bone: Secondary | ICD-10-CM | POA: Diagnosis present

## 2021-08-22 DIAGNOSIS — I471 Supraventricular tachycardia: Secondary | ICD-10-CM | POA: Diagnosis present

## 2021-08-22 DIAGNOSIS — R54 Age-related physical debility: Secondary | ICD-10-CM | POA: Diagnosis present

## 2021-08-22 DIAGNOSIS — Z89511 Acquired absence of right leg below knee: Secondary | ICD-10-CM | POA: Diagnosis not present

## 2021-08-22 DIAGNOSIS — D61818 Other pancytopenia: Secondary | ICD-10-CM

## 2021-08-22 DIAGNOSIS — R599 Enlarged lymph nodes, unspecified: Secondary | ICD-10-CM | POA: Insufficient documentation

## 2021-08-22 DIAGNOSIS — G8929 Other chronic pain: Secondary | ICD-10-CM

## 2021-08-22 DIAGNOSIS — Z8616 Personal history of COVID-19: Secondary | ICD-10-CM | POA: Diagnosis not present

## 2021-08-22 DIAGNOSIS — R7402 Elevation of levels of lactic acid dehydrogenase (LDH): Secondary | ICD-10-CM | POA: Diagnosis not present

## 2021-08-22 DIAGNOSIS — I700 Atherosclerosis of aorta: Secondary | ICD-10-CM | POA: Diagnosis present

## 2021-08-22 DIAGNOSIS — R0602 Shortness of breath: Secondary | ICD-10-CM | POA: Diagnosis not present

## 2021-08-22 DIAGNOSIS — E873 Alkalosis: Secondary | ICD-10-CM | POA: Diagnosis present

## 2021-08-22 DIAGNOSIS — C3431 Malignant neoplasm of lower lobe, right bronchus or lung: Secondary | ICD-10-CM | POA: Diagnosis present

## 2021-08-22 DIAGNOSIS — Z20822 Contact with and (suspected) exposure to covid-19: Secondary | ICD-10-CM | POA: Diagnosis present

## 2021-08-22 DIAGNOSIS — F17210 Nicotine dependence, cigarettes, uncomplicated: Secondary | ICD-10-CM | POA: Diagnosis present

## 2021-08-22 DIAGNOSIS — Z66 Do not resuscitate: Secondary | ICD-10-CM | POA: Diagnosis present

## 2021-08-22 DIAGNOSIS — E876 Hypokalemia: Secondary | ICD-10-CM | POA: Diagnosis present

## 2021-08-22 DIAGNOSIS — Z886 Allergy status to analgesic agent status: Secondary | ICD-10-CM

## 2021-08-22 DIAGNOSIS — E43 Unspecified severe protein-calorie malnutrition: Secondary | ICD-10-CM | POA: Diagnosis present

## 2021-08-22 DIAGNOSIS — J189 Pneumonia, unspecified organism: Principal | ICD-10-CM | POA: Diagnosis present

## 2021-08-22 DIAGNOSIS — R059 Cough, unspecified: Secondary | ICD-10-CM | POA: Diagnosis not present

## 2021-08-22 DIAGNOSIS — D696 Thrombocytopenia, unspecified: Secondary | ICD-10-CM | POA: Diagnosis not present

## 2021-08-22 DIAGNOSIS — E860 Dehydration: Secondary | ICD-10-CM | POA: Diagnosis not present

## 2021-08-22 DIAGNOSIS — Z79899 Other long term (current) drug therapy: Secondary | ICD-10-CM

## 2021-08-22 DIAGNOSIS — R042 Hemoptysis: Secondary | ICD-10-CM | POA: Diagnosis present

## 2021-08-22 DIAGNOSIS — J441 Chronic obstructive pulmonary disease with (acute) exacerbation: Secondary | ICD-10-CM | POA: Diagnosis present

## 2021-08-22 DIAGNOSIS — R590 Localized enlarged lymph nodes: Secondary | ICD-10-CM

## 2021-08-22 DIAGNOSIS — J9621 Acute and chronic respiratory failure with hypoxia: Secondary | ICD-10-CM

## 2021-08-22 DIAGNOSIS — Z515 Encounter for palliative care: Secondary | ICD-10-CM

## 2021-08-22 DIAGNOSIS — G4700 Insomnia, unspecified: Secondary | ICD-10-CM | POA: Diagnosis not present

## 2021-08-22 DIAGNOSIS — R490 Dysphonia: Secondary | ICD-10-CM | POA: Diagnosis present

## 2021-08-22 DIAGNOSIS — E785 Hyperlipidemia, unspecified: Secondary | ICD-10-CM | POA: Diagnosis present

## 2021-08-22 DIAGNOSIS — R000 Tachycardia, unspecified: Secondary | ICD-10-CM | POA: Diagnosis not present

## 2021-08-22 DIAGNOSIS — C7931 Secondary malignant neoplasm of brain: Secondary | ICD-10-CM | POA: Diagnosis not present

## 2021-08-22 DIAGNOSIS — F419 Anxiety disorder, unspecified: Secondary | ICD-10-CM

## 2021-08-22 DIAGNOSIS — R0600 Dyspnea, unspecified: Secondary | ICD-10-CM | POA: Diagnosis not present

## 2021-08-22 DIAGNOSIS — K5900 Constipation, unspecified: Secondary | ICD-10-CM | POA: Diagnosis not present

## 2021-08-22 DIAGNOSIS — J439 Emphysema, unspecified: Secondary | ICD-10-CM | POA: Diagnosis not present

## 2021-08-22 DIAGNOSIS — R52 Pain, unspecified: Secondary | ICD-10-CM | POA: Diagnosis not present

## 2021-08-22 DIAGNOSIS — J449 Chronic obstructive pulmonary disease, unspecified: Secondary | ICD-10-CM | POA: Diagnosis not present

## 2021-08-22 DIAGNOSIS — R739 Hyperglycemia, unspecified: Secondary | ICD-10-CM | POA: Diagnosis not present

## 2021-08-22 DIAGNOSIS — Z6821 Body mass index (BMI) 21.0-21.9, adult: Secondary | ICD-10-CM

## 2021-08-22 DIAGNOSIS — R069 Unspecified abnormalities of breathing: Secondary | ICD-10-CM | POA: Diagnosis present

## 2021-08-22 DIAGNOSIS — E46 Unspecified protein-calorie malnutrition: Secondary | ICD-10-CM | POA: Diagnosis not present

## 2021-08-22 DIAGNOSIS — C3490 Malignant neoplasm of unspecified part of unspecified bronchus or lung: Secondary | ICD-10-CM

## 2021-08-22 DIAGNOSIS — R627 Adult failure to thrive: Secondary | ICD-10-CM | POA: Diagnosis present

## 2021-08-22 DIAGNOSIS — Z888 Allergy status to other drugs, medicaments and biological substances status: Secondary | ICD-10-CM

## 2021-08-22 DIAGNOSIS — M62838 Other muscle spasm: Secondary | ICD-10-CM | POA: Diagnosis not present

## 2021-08-22 DIAGNOSIS — Z7189 Other specified counseling: Secondary | ICD-10-CM | POA: Diagnosis not present

## 2021-08-22 LAB — COMPREHENSIVE METABOLIC PANEL
ALT: 50 U/L — ABNORMAL HIGH (ref 0–44)
AST: 98 U/L — ABNORMAL HIGH (ref 15–41)
Albumin: 2.3 g/dL — ABNORMAL LOW (ref 3.5–5.0)
Alkaline Phosphatase: 181 U/L — ABNORMAL HIGH (ref 38–126)
Anion gap: 14 (ref 5–15)
BUN: 19 mg/dL (ref 8–23)
CO2: 32 mmol/L (ref 22–32)
Calcium: 9.8 mg/dL (ref 8.9–10.3)
Chloride: 89 mmol/L — ABNORMAL LOW (ref 98–111)
Creatinine, Ser: 0.98 mg/dL (ref 0.61–1.24)
GFR, Estimated: >60 mL/min (ref >60)
Glucose, Bld: 160 mg/dL — ABNORMAL HIGH (ref 70–99)
Potassium: 4.1 mmol/L (ref 3.5–5.1)
Sodium: 135 mmol/L (ref 135–145)
Total Bilirubin: 1.1 mg/dL (ref 0.3–1.2)
Total Protein: 6.3 g/dL — ABNORMAL LOW (ref 6.5–8.1)

## 2021-08-22 LAB — CBC WITH DIFFERENTIAL/PLATELET
Abs Immature Granulocytes: 0.21 10*3/uL — ABNORMAL HIGH (ref 0.00–0.07)
Basophils Absolute: 0 10*3/uL (ref 0.0–0.1)
Basophils Relative: 1 %
Eosinophils Absolute: 0 10*3/uL (ref 0.0–0.5)
Eosinophils Relative: 0 %
HCT: 36.3 % — ABNORMAL LOW (ref 39.0–52.0)
Hemoglobin: 12.7 g/dL — ABNORMAL LOW (ref 13.0–17.0)
Immature Granulocytes: 4 %
Lymphocytes Relative: 51 %
Lymphs Abs: 2.5 10*3/uL (ref 0.7–4.0)
MCH: 30 pg (ref 26.0–34.0)
MCHC: 35 g/dL (ref 30.0–36.0)
MCV: 85.8 fL (ref 80.0–100.0)
Monocytes Absolute: 0.3 10*3/uL (ref 0.1–1.0)
Monocytes Relative: 6 %
Neutro Abs: 1.8 10*3/uL (ref 1.7–7.7)
Neutrophils Relative %: 38 %
Platelets: 39 10*3/uL — ABNORMAL LOW (ref 150–400)
RBC: 4.23 MIL/uL (ref 4.22–5.81)
RDW: 12.2 % (ref 11.5–15.5)
Smear Review: DECREASED
WBC: 4.8 10*3/uL (ref 4.0–10.5)
nRBC: 3.3 % — ABNORMAL HIGH (ref 0.0–0.2)

## 2021-08-22 LAB — SARS CORONAVIRUS 2 BY RT PCR (HOSPITAL ORDER, PERFORMED IN ~~LOC~~ HOSPITAL LAB): SARS Coronavirus 2: NEGATIVE

## 2021-08-22 LAB — MAGNESIUM: Magnesium: 1.8 mg/dL (ref 1.7–2.4)

## 2021-08-22 SURGERY — CANCELLED PROCEDURE
Laterality: Bilateral

## 2021-08-22 MED ORDER — HYOSCYAMINE SULFATE 0.125 MG SL SUBL
0.1250 mg | SUBLINGUAL_TABLET | SUBLINGUAL | Status: DC | PRN
  Filled 2021-08-22: qty 1

## 2021-08-22 MED ORDER — OXYCODONE HCL 5 MG PO TABS
5.0000 mg | ORAL_TABLET | ORAL | Status: DC | PRN
  Administered 2021-08-22 – 2021-08-24 (×6): 10 mg via ORAL
  Filled 2021-08-22 (×6): qty 2

## 2021-08-22 MED ORDER — ENSURE ENLIVE PO LIQD
237.0000 mL | Freq: Three times a day (TID) | ORAL | Status: DC
  Administered 2021-08-22 – 2021-08-23 (×4): 237 mL via ORAL

## 2021-08-22 MED ORDER — ACETAMINOPHEN 650 MG RE SUPP: 650.0000 mg | Freq: Four times a day (QID) | RECTAL | Status: DC | PRN

## 2021-08-22 MED ORDER — ONDANSETRON HCL 4 MG PO TABS
4.0000 mg | ORAL_TABLET | Freq: Four times a day (QID) | ORAL | Status: DC | PRN
  Administered 2021-08-24: 4 mg via ORAL
  Filled 2021-08-22: qty 1

## 2021-08-22 MED ORDER — DOCUSATE SODIUM 100 MG PO CAPS
100.0000 mg | ORAL_CAPSULE | Freq: Two times a day (BID) | ORAL | Status: DC
  Administered 2021-08-23 – 2021-08-26 (×6): 100 mg via ORAL
  Filled 2021-08-22 (×7): qty 1

## 2021-08-22 MED ORDER — ACETAMINOPHEN 500 MG PO TABS: 500.0000 mg | ORAL_TABLET | Freq: Four times a day (QID) | ORAL | Status: DC | PRN

## 2021-08-22 MED ORDER — SODIUM CHLORIDE 0.9 % IV SOLN
INTRAVENOUS | Status: AC
  Filled 2021-08-22: qty 20

## 2021-08-22 MED ORDER — ALPRAZOLAM 0.5 MG PO TABS
0.5000 mg | ORAL_TABLET | Freq: Three times a day (TID) | ORAL | Status: DC | PRN
  Administered 2021-08-22 – 2021-08-25 (×5): 0.5 mg via ORAL
  Filled 2021-08-22 (×5): qty 1

## 2021-08-22 MED ORDER — POLYETHYLENE GLYCOL 3350 17 G PO PACK
17.0000 g | PACK | Freq: Every day | ORAL | Status: DC
  Administered 2021-08-25 – 2021-08-26 (×2): 17 g via ORAL
  Filled 2021-08-22 (×4): qty 1

## 2021-08-22 MED ORDER — NORTRIPTYLINE HCL 25 MG PO CAPS
100.0000 mg | ORAL_CAPSULE | Freq: Every day | ORAL | Status: DC
  Administered 2021-08-22 – 2021-08-30 (×8): 100 mg via ORAL
  Filled 2021-08-22 (×9): qty 4

## 2021-08-22 MED ORDER — UMECLIDINIUM-VILANTEROL 62.5-25 MCG/INH IN AEPB
1.0000 | INHALATION_SPRAY | Freq: Every day | RESPIRATORY_TRACT | Status: DC
  Administered 2021-08-23: 1 via RESPIRATORY_TRACT
  Filled 2021-08-22: qty 14

## 2021-08-22 MED ORDER — MEGESTROL ACETATE 400 MG/10ML PO SUSP
400.0000 mg | Freq: Two times a day (BID) | ORAL | Status: DC
  Administered 2021-08-22 – 2021-08-26 (×7): 400 mg via ORAL
  Filled 2021-08-22 (×8): qty 10

## 2021-08-22 MED ORDER — ACETAMINOPHEN 325 MG PO TABS: 650.0000 mg | ORAL_TABLET | Freq: Four times a day (QID) | ORAL | Status: DC | PRN

## 2021-08-22 MED ORDER — DEXTROMETHORPHAN-GUAIFENESIN 10-100 MG/5ML PO LIQD: 5.0000 mL | ORAL | Status: DC | PRN

## 2021-08-22 MED ORDER — MIRTAZAPINE 15 MG PO TBDP
7.5000 mg | ORAL_TABLET | Freq: Every day | ORAL | Status: DC
  Administered 2021-08-22 – 2021-08-30 (×8): 7.5 mg via ORAL
  Filled 2021-08-22 (×9): qty 0.5

## 2021-08-22 MED ORDER — ADULT MULTIVITAMIN W/MINERALS CH
1.0000 | ORAL_TABLET | Freq: Every day | ORAL | Status: DC
  Administered 2021-08-23 – 2021-08-26 (×4): 1 via ORAL
  Filled 2021-08-22 (×4): qty 1

## 2021-08-22 MED ORDER — SENNA 8.6 MG PO TABS: 1.0000 | ORAL_TABLET | Freq: Every day | ORAL | Status: DC | PRN

## 2021-08-22 MED ORDER — ONDANSETRON HCL 4 MG/2ML IJ SOLN
4.0000 mg | Freq: Four times a day (QID) | INTRAMUSCULAR | Status: DC | PRN
  Administered 2021-08-22 – 2021-08-23 (×3): 4 mg via INTRAVENOUS
  Filled 2021-08-22 (×4): qty 2

## 2021-08-22 MED ORDER — ENOXAPARIN SODIUM 40 MG/0.4ML IJ SOSY
40.0000 mg | PREFILLED_SYRINGE | INTRAMUSCULAR | Status: DC
  Administered 2021-08-22: 40 mg via SUBCUTANEOUS
  Filled 2021-08-22: qty 0.4

## 2021-08-22 MED ORDER — PANTOPRAZOLE SODIUM 40 MG PO TBEC
40.0000 mg | DELAYED_RELEASE_TABLET | Freq: Every day | ORAL | Status: DC
  Administered 2021-08-23 – 2021-08-26 (×4): 40 mg via ORAL
  Filled 2021-08-22 (×4): qty 1

## 2021-08-22 MED ORDER — CEFTRIAXONE SODIUM 1 G IJ SOLR
1.0000 g | INTRAMUSCULAR | Status: DC
  Administered 2021-08-22: 1 g via INTRAVENOUS
  Filled 2021-08-22: qty 10

## 2021-08-22 MED ORDER — PREDNISONE 5 MG PO TABS
15.0000 mg | ORAL_TABLET | Freq: Every day | ORAL | Status: DC
  Administered 2021-08-23: 15 mg via ORAL
  Filled 2021-08-22: qty 1

## 2021-08-22 MED ORDER — SENNA 8.6 MG PO TABS
1.0000 | ORAL_TABLET | Freq: Two times a day (BID) | ORAL | Status: DC
  Administered 2021-08-23 – 2021-08-26 (×6): 8.6 mg via ORAL
  Filled 2021-08-22 (×7): qty 1

## 2021-08-22 SURGICAL SUPPLY — 29 items
BRUSH CYTOL CELLEBRITY 1.5X140 (MISCELLANEOUS) IMPLANT
CANISTER SUCT 3000ML PPV (MISCELLANEOUS) ×3 IMPLANT
CONT SPEC 4OZ CLIKSEAL STRL BL (MISCELLANEOUS) ×3 IMPLANT
COVER BACK TABLE 60X90IN (DRAPES) ×3 IMPLANT
COVER DOME SNAP 22 D (MISCELLANEOUS) ×3 IMPLANT
FORCEPS BIOP RJ4 1.8 (CUTTING FORCEPS) IMPLANT
GAUZE SPONGE 4X4 12PLY STRL (GAUZE/BANDAGES/DRESSINGS) ×3 IMPLANT
GLOVE BIO SURGEON STRL SZ7.5 (GLOVE) ×3 IMPLANT
GOWN STRL REUS W/ TWL LRG LVL3 (GOWN DISPOSABLE) ×2 IMPLANT
GOWN STRL REUS W/TWL LRG LVL3 (GOWN DISPOSABLE) ×3
KIT CLEAN ENDO COMPLIANCE (KITS) ×6 IMPLANT
KIT TURNOVER KIT B (KITS) ×3 IMPLANT
MARKER SKIN DUAL TIP RULER LAB (MISCELLANEOUS) ×3 IMPLANT
NEEDLE EBUS SONO TIP PENTAX (NEEDLE) ×3 IMPLANT
NS IRRIG 1000ML POUR BTL (IV SOLUTION) ×3 IMPLANT
OIL SILICONE PENTAX (PARTS (SERVICE/REPAIRS)) ×3 IMPLANT
PAD ARMBOARD 7.5X6 YLW CONV (MISCELLANEOUS) ×6 IMPLANT
SOL ANTI FOG 6CC (MISCELLANEOUS) ×2 IMPLANT
SOLUTION ANTI FOG 6CC (MISCELLANEOUS) ×1
SYR 20CC LL (SYRINGE) ×6 IMPLANT
SYR 20ML ECCENTRIC (SYRINGE) ×6 IMPLANT
SYR 50ML SLIP (SYRINGE) IMPLANT
SYR 5ML LUER SLIP (SYRINGE) ×3 IMPLANT
TOWEL OR 17X24 6PK STRL BLUE (TOWEL DISPOSABLE) ×3 IMPLANT
TRAP SPECIMEN MUCOUS 40CC (MISCELLANEOUS) IMPLANT
TUBE CONNECTING 20X1/4 (TUBING) ×6 IMPLANT
UNDERPAD 30X30 (UNDERPADS AND DIAPERS) ×3 IMPLANT
VALVE DISPOSABLE (MISCELLANEOUS) ×3 IMPLANT
WATER STERILE IRR 1000ML POUR (IV SOLUTION) ×3 IMPLANT

## 2021-08-22 NOTE — Progress Notes (Signed)
Patient arrives to short stay, HR in 130's, O2 sats were in mid 80s. Patient placed on 4 liters of oxygen.  Dr. Lissa Hoard notified and gave verbal order for EKG.  Dr. Myrlene Broker stated he would come see the patient.    Patient denies chest pain.

## 2021-08-22 NOTE — Evaluation (Signed)
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 HPI:  70 year old man presenting from SNF with acute on chronic failure to thrive, anorexia, weight loss, hemoptysis found to have acute on chronic hypoxemic respiratory failure.  Pt with advanced lung cancer. He was scheduled for bronchoscopy today but was too unstable with high HR and worsening O2 need.  Pt with recent loss of voice and "inability to tolerate PO". CT of neck 9/13 noted: "Hyperenhancing mass adjacent to the left transverse process of C1 with expansion of the adjacent left obliquus capitis inferior muscle, most consistent with metastatic disease".    Assessment / Plan / Recommendation  Clinical Impression  Pt presents with concern for possible component of pharyngeal dysphagia. He states recent loss of voice and was aphonic during interaction with SLP. He also notes increased coughing with POs. Intermittent cough noted with thin liquids. Given increased risk for aspiration and decreased respiratory status recommend continue NPO with instrumental assessment subsequent date to guide diet recommendations. Pt okay for ice chips, water and PO meds with oral care as tolerated. SLP to follow up for MBSS. Pt family and RN in agreement with plan. SLP Visit Diagnosis: Dysphagia, unspecified  (R13.10)    Aspiration Risk  Moderate aspiration risk;Risk for inadequate nutrition/hydration    Diet Recommendation NPO    Medication Administration: Whole meds with puree    Other  Recommendations Oral Care Recommendations: Oral care QID;Oral care prior to ice chip/H20    Recommendations for follow up therapy are one component of a multi-disciplinary discharge planning process, led by the attending physician.  Recommendations may be updated based on patient status, additional functional criteria and insurance authorization.  Follow up Recommendations Skilled Nursing facility      Frequency and Duration min 2x/week  2 weeks       Prognosis Prognosis for Safe Diet Advancement: Fair Barriers to Reach Goals: Severity of deficits      Swallow Study   General Date of Onset: 08/21/21 HPI: 70 year old man presenting from SNF with acute on chronic failure to thrive, anorexia, weight loss, hemoptysis found to have acute on chronic hypoxemic respiratory failure.  Pt with advanced lung cancer. He was scheduled for bronchoscopy today but was too unstable with high HR and worsening O2 need.  Pt with recent loss of voice and "inability to tolerate PO". CT of neck 9/13 noted: "Hyperenhancing mass adjacent to the left transverse process of C1 with expansion of the adjacent left obliquus capitis inferior muscle, most consistent with metastatic disease". Type of Study: Bedside Swallow Evaluation Previous Swallow Assessment: none on file Diet Prior to this Study: NPO Temperature Spikes Noted: No Respiratory Status: Nasal cannula History of Recent Intubation: No Behavior/Cognition: Alert;Cooperative Oral Cavity Assessment: Dry Oral Care Completed by SLP: Yes  Oral Cavity - Dentition: Dentures, not available (family to bring subsequent date) Vision: Functional for self-feeding Self-Feeding Abilities: Able to feed self Patient Positioning: Upright in bed Baseline Vocal Quality: Aphonic;Suspected  CN X (Vagus) involvement Volitional Cough: Congested Volitional Swallow: Able to elicit    Oral/Motor/Sensory Function Overall Oral Motor/Sensory Function: Generalized oral weakness   Ice Chips Ice chips: Impaired Presentation: Spoon Oral Phase Functional Implications: Prolonged oral transit Pharyngeal Phase Impairments: Suspected delayed Swallow;Multiple swallows   Thin Liquid Thin Liquid: Impaired Presentation: Cup;Straw Oral Phase Functional Implications: Prolonged oral transit Pharyngeal  Phase Impairments: Suspected delayed Swallow;Multiple swallows;Decreased hyoid-laryngeal movement;Cough - Delayed    Nectar Thick Nectar Thick Liquid: Not tested   Honey Thick Honey Thick Liquid: Not tested   Puree Puree: Within functional limits Presentation: Spoon   Solid     Solid: Impaired Presentation: Self Fed Oral Phase Impairments: Reduced labial seal Oral Phase Functional Implications: Impaired mastication (reports difficulty masticating without dentures) Pharyngeal Phase Impairments: Suspected delayed Swallow      Hayden Rasmussen MA, CCC-SLP Acute Rehabilitation Services   08/22/2021,4:16 PM
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
## 2021-08-22 NOTE — Anesthesia Preprocedure Evaluation (Deleted)
Anesthesia Evaluation    Airway        Dental   Pulmonary Current Smoker,           Cardiovascular      Neuro/Psych    GI/Hepatic   Endo/Other    Renal/GU      Musculoskeletal   Abdominal   Peds  Hematology   Anesthesia Other Findings   Reproductive/Obstetrics                             Anesthesia Physical Anesthesia Plan  ASA:   Anesthesia Plan:    Post-op Pain Management:    Induction:   PONV Risk Score and Plan:   Airway Management Planned:   Additional Equipment:   Intra-op Plan:   Post-operative Plan:   Informed Consent:   Plan Discussed with:   Anesthesia Plan Comments: (Pt presented with tachypnea, tachycardia (HR ~130-135) and SpO2 85% on RA. On 4L [place] SpO2 90%. Discussed with Dr. Valeta Harms that I do not feel comfortable taking this patient back for diagnostic procedure given he appears to be decompensating. He will admit and workup his acute illness. )        Anesthesia Quick Evaluation
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
## 2021-08-22 NOTE — Progress Notes (Signed)
   08/22/21 1509  Assess: MEWS Score  Temp 98.6 F (37 C)  BP 122/74  Pulse Rate (!) 123  ECG Heart Rate (!) 125  Resp (!) 28  Level of Consciousness Alert  SpO2 94 %  O2 Device Nasal Cannula  O2 Flow Rate (L/min) 3.5 L/min  Assess: MEWS Score  MEWS Temp 0  MEWS Systolic 0  MEWS Pulse 2  MEWS RR 2  MEWS LOC 0  MEWS Score 4  MEWS Score Color Red  Assess: if the MEWS score is Yellow or Red  Were vital signs taken at a resting state? Yes  Focused Assessment No change from prior assessment  Early Detection of Sepsis Score *See Row Information* Medium  MEWS guidelines implemented *See Row Information* Yes  Treat  Pain Scale 0-10  Pain Score 10  Pain Type Chronic pain  Pain Location Chest  Multiple Pain Sites Yes  2nd Pain Site  Pain Score 10  Pain Type Chronic pain  Pain Location Back  Take Vital Signs  Increase Vital Sign Frequency  Red: Q 1hr X 4 then Q 4hr X 4, if remains red, continue Q 4hrs  Escalate  MEWS: Escalate Red: discuss with charge nurse/RN and provider, consider discussing with RRT  Notify: Provider  Provider Name/Title D. Tamala Julian MD  Date Provider Notified 08/22/21  Time Provider Notified 6720  Notification Type Page  Notification Reason Other (Comment) (RED MEWs and Pain)  Provider response See new orders  Date of Provider Response 08/22/21  Time of Provider Response 1515

## 2021-08-23 ENCOUNTER — Inpatient Hospital Stay (HOSPITAL_COMMUNITY): Payer: Medicare Other

## 2021-08-23 DIAGNOSIS — R069 Unspecified abnormalities of breathing: Secondary | ICD-10-CM

## 2021-08-23 DIAGNOSIS — J441 Chronic obstructive pulmonary disease with (acute) exacerbation: Secondary | ICD-10-CM | POA: Diagnosis not present

## 2021-08-23 DIAGNOSIS — R0602 Shortness of breath: Secondary | ICD-10-CM | POA: Diagnosis not present

## 2021-08-23 DIAGNOSIS — J9621 Acute and chronic respiratory failure with hypoxia: Secondary | ICD-10-CM | POA: Diagnosis not present

## 2021-08-23 DIAGNOSIS — J449 Chronic obstructive pulmonary disease, unspecified: Secondary | ICD-10-CM | POA: Diagnosis not present

## 2021-08-23 DIAGNOSIS — F419 Anxiety disorder, unspecified: Secondary | ICD-10-CM | POA: Diagnosis not present

## 2021-08-23 DIAGNOSIS — R599 Enlarged lymph nodes, unspecified: Secondary | ICD-10-CM | POA: Diagnosis not present

## 2021-08-23 DIAGNOSIS — R918 Other nonspecific abnormal finding of lung field: Secondary | ICD-10-CM | POA: Diagnosis not present

## 2021-08-23 LAB — BASIC METABOLIC PANEL
Anion gap: 10 (ref 5–15)
BUN: 22 mg/dL (ref 8–23)
CO2: 35 mmol/L — ABNORMAL HIGH (ref 22–32)
Calcium: 9.4 mg/dL (ref 8.9–10.3)
Chloride: 91 mmol/L — ABNORMAL LOW (ref 98–111)
Creatinine, Ser: 0.87 mg/dL (ref 0.61–1.24)
GFR, Estimated: >60 mL/min (ref >60)
Glucose, Bld: 131 mg/dL — ABNORMAL HIGH (ref 70–99)
Potassium: 3.7 mmol/L (ref 3.5–5.1)
Sodium: 136 mmol/L (ref 135–145)

## 2021-08-23 LAB — CBC
HCT: 34.2 % — ABNORMAL LOW (ref 39.0–52.0)
Hemoglobin: 11.6 g/dL — ABNORMAL LOW (ref 13.0–17.0)
MCH: 29.7 pg (ref 26.0–34.0)
MCHC: 33.9 g/dL (ref 30.0–36.0)
MCV: 87.5 fL (ref 80.0–100.0)
Platelets: 33 10*3/uL — ABNORMAL LOW (ref 150–400)
RBC: 3.91 MIL/uL — ABNORMAL LOW (ref 4.22–5.81)
RDW: 12.3 % (ref 11.5–15.5)
WBC: 4.1 10*3/uL (ref 4.0–10.5)
nRBC: 3.4 % — ABNORMAL HIGH (ref 0.0–0.2)

## 2021-08-23 LAB — MAGNESIUM: Magnesium: 1.9 mg/dL (ref 1.7–2.4)

## 2021-08-23 MED ORDER — METOPROLOL TARTRATE 25 MG PO TABS
25.0000 mg | ORAL_TABLET | Freq: Three times a day (TID) | ORAL | Status: DC
  Administered 2021-08-23 – 2021-08-24 (×5): 25 mg via ORAL
  Filled 2021-08-23 (×5): qty 1

## 2021-08-23 MED ORDER — FOOD THICKENER (SIMPLYTHICK): 1.0000 | ORAL | Status: DC | PRN

## 2021-08-23 MED ORDER — AZITHROMYCIN 250 MG PO TABS
500.0000 mg | ORAL_TABLET | Freq: Every day | ORAL | Status: DC
  Administered 2021-08-23 – 2021-08-26 (×4): 500 mg via ORAL
  Filled 2021-08-23 (×4): qty 2

## 2021-08-23 MED ORDER — MAGNESIUM SULFATE 2 GM/50ML IV SOLN
2.0000 g | Freq: Once | INTRAVENOUS | Status: AC
  Administered 2021-08-23: 2 g via INTRAVENOUS
  Filled 2021-08-23: qty 50

## 2021-08-23 MED ORDER — SODIUM CHLORIDE 0.9 % IV SOLN
2.0000 g | INTRAVENOUS | Status: DC
  Administered 2021-08-23: 2 g via INTRAVENOUS
  Filled 2021-08-23: qty 20

## 2021-08-23 MED ORDER — IPRATROPIUM-ALBUTEROL 0.5-2.5 (3) MG/3ML IN SOLN: 3.0000 mL | Freq: Three times a day (TID) | RESPIRATORY_TRACT | Status: DC

## 2021-08-23 MED ORDER — MOMETASONE FURO-FORMOTEROL FUM 200-5 MCG/ACT IN AERO
2.0000 | INHALATION_SPRAY | Freq: Two times a day (BID) | RESPIRATORY_TRACT | Status: DC
  Administered 2021-08-23 – 2021-08-31 (×15): 2 via RESPIRATORY_TRACT
  Filled 2021-08-23: qty 8.8

## 2021-08-23 MED ORDER — POTASSIUM CHLORIDE CRYS ER 20 MEQ PO TBCR
40.0000 meq | EXTENDED_RELEASE_TABLET | Freq: Once | ORAL | Status: AC
  Administered 2021-08-23: 40 meq via ORAL
  Filled 2021-08-23: qty 2

## 2021-08-23 MED ORDER — METHYLPREDNISOLONE SODIUM SUCC 40 MG IJ SOLR
40.0000 mg | Freq: Every day | INTRAMUSCULAR | Status: DC
  Administered 2021-08-23 – 2021-08-31 (×9): 40 mg via INTRAVENOUS
  Filled 2021-08-23 (×9): qty 1

## 2021-08-23 MED ORDER — IPRATROPIUM-ALBUTEROL 0.5-2.5 (3) MG/3ML IN SOLN: 3.0000 mL | RESPIRATORY_TRACT | Status: DC | PRN

## 2021-08-23 NOTE — Progress Notes (Signed)
Initial Nutrition Assessment  DOCUMENTATION CODES:   Severe malnutrition in context of chronic illness  INTERVENTION:   - Advance diet as medically appropriate per MD and SLP  - Hormel Shake TID, provides 520 kcal and 22 grams of protein each  - Multivitamin w/ minerals daily   NUTRITION DIAGNOSIS:   Severe Malnutrition related to chronic illness (COPD, Stage IV Lung Cancer) as evidenced by severe muscle depletion, severe fat depletion, percent weight loss, energy intake < or equal to 75% for > or equal to 1 month.  GOAL:   Patient will meet greater than or equal to 90% of their needs   MONITOR:   PO intake, Supplement acceptance, Diet advancement, Weight trends  REASON FOR ASSESSMENT:   Malnutrition Screening Tool    ASSESSMENT:   70 y.o. male presented to the ED 9/10 from SNF w/ failure to thrive, anorexia, weight loss, and respiratory failure. PMH of COPD, GERD, stage IV lung cancer, and R BKA. Pt recently discharged from [facility] on 9/16. Pt admitted with acute on chronic hypoxemic respiratory failure.   9/21: SLP MBS - D2 diet w/ nectar thick liquids  Pt resting in bed, seemed very weak and lethargic, family members x 2 present at bedside and able to provide detailed information. Pt has been living at an assisted living facility x 3 years until last week. Reports that appetite was good up until last Monday when going to [facility] and the SNF. Family reports that pt was drinking 2 Ensure per day prior to admission, family was providing them. Reports that pt would eat all food provided during meal time at living facility.   Per pt and family, patient's UBW is ~175#. Pt has had significant weight loss starting in June/July, family was unable to provide how much. Per EMR, pt has had 10.2% weight loss in ~4 months.   Pt was able to complete all ADL's, pt does has BKA and uses a prosthetic. Pt recently started using a walker within the last 2 weeks.  Family reports that  pt's previous living facility recently had a high number cases of COVID and the flu and pt was limited on activities and exiting room/facility. Prior pt could come and go at facility as pleased.   Palliative care following pt to determine GOC.   Planned Bronchoscopy on 9/20, but cancelled due to pt current condition, no plan for one at this time.   Discussed ONS with pt and family; agreeable to try the Hormel shake and transition to Ensure when diet is appropriate.  Medications reviewed and include: Antibiotic, Megace, Solu-Medrol, Protonix, MVI Labs reviewed.  NUTRITION - FOCUSED PHYSICAL EXAM:  Flowsheet Row Most Recent Value  Orbital Region Severe depletion  Upper Arm Region Moderate depletion  Thoracic and Lumbar Region Moderate depletion  Buccal Region Severe depletion  Temple Region Severe depletion  Clavicle Bone Region Severe depletion  Clavicle and Acromion Bone Region Severe depletion  Scapular Bone Region Severe depletion  Dorsal Hand Severe depletion  Patellar Region Severe depletion  Anterior Thigh Region Severe depletion  Posterior Calf Region Severe depletion  [R BKA]  Edema (RD Assessment) None  Hair Reviewed  Eyes Reviewed  Mouth Reviewed  Skin Reviewed  Nails Reviewed       Diet Order:   Diet Order             DIET DYS 2 Room service appropriate? Yes with Assist; Fluid consistency: Nectar Thick  Diet effective now  EDUCATION NEEDS:   No education needs have been identified at this time  Skin:  Skin Assessment: Reviewed RN Assessment  Last BM:  9/20  Height:   Ht Readings from Last 1 Encounters:  08/22/21 6' (1.829 m)    Weight:   Wt Readings from Last 1 Encounters:  08/22/21 71.3 kg    Ideal Body Weight:  80.9 kg  BMI:  Body mass index is 21.32 kg/m.  Estimated Nutritional Needs:   Kcal:  2200-2400  Protein:  110-125 gm  Fluid:  > 2.2 L    Leroi Haque BS, PLDN Clinical Dietitian See [facility] for  contact information.

## 2021-08-23 NOTE — Progress Notes (Signed)
Modified Barium Swallow Progress Note  Patient Details  Name: [name] MRN: [id number] Date of Birth: Jan 08, 1951  Today's Date: 08/23/2021  Modified Barium Swallow completed.  Full report located under Chart Review in the Imaging Section.  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 He demonstrated vallecular residue, posterior pharyngeal wall residue, and pyriform sinus residue. Residue was improved with a chin tuck combined with a left head position. Penetration (PAS 5) was noted with thin liquids and increased in frequency as the study progressed. Penetration was partly secondary to a mild pharyngeal delay and from pyriform sinus residue. No definitive aspiration was noted during the study while fluoro was on, but aspiration is suspected due to weak coughing noted after episodes of aspiration. This was improved with a chin tuck with left head turn, but pt demonstrated difficulty consistently demonstrating this. A dysphagia 2 diet with nectar thick liquids is recommended at this time, with potential for liberalization with consistency is demonstrated with strategies/GOC change. SLP will follow for dysphagia treatment.   Swallow Evaluation Recommendations       SLP Diet Recommendations: Dysphagia 2 (Fine chop) solids;Nectar thick liquid   Liquid Administration via: Cup;No straw   Medication Administration: Crushed with puree   Supervision: Full supervision/cueing for compensatory strategies;Staff to assist with self feeding   Compensations: Slow rate;Small sips/bites;Follow solids with liquid;Chin tuck (chin tuck with left head turn)   Postural Changes: Seated upright at 90 degrees   Oral Care Recommendations: Oral care BID      Laiyla Slagel I. Hardin Negus, [place], [place] Office number [phone] Pager [phone]  Horton Marshall 08/23/2021,9:50 AM

## 2021-08-23 NOTE — Progress Notes (Addendum)
This chaplain responded to the Pt. consult for prayer.  The Pt. is awake and sitting up in bed.  The Pt. friends/family along with clergy walked in behind the chaplain.  The Pt. accepted the chaplain's invitation to return on Wednesday.

## 2021-08-23 NOTE — Progress Notes (Addendum)
NAME:  [name], MRN:  [id number], DOB:  25-Apr-1951, LOS: 1 ADMISSION DATE:  08/22/2021, CONSULTATION DATE:  08/22/21 REFERRING MD:  Valeta Harms, CHIEF COMPLAINT:  SOB   History of Present Illness:  70 year old man presenting from SNF with acute on chronic failure to thrive, anorexia, weight loss, hemoptysis found to have acute on chronic hypoxemic respiratory failure.  He was scheduled for bronchoscopy today but was too unstable with high HR and worsening O2 need.  PCCM to admit with TRH taking over starting 9/21.  Patient denies any recent fevers, just loss of voice, fatigue, and inability to tolerate PO.  Labs/imaging etc pending.  Pertinent  Medical History  COPD GERD Anxiety Stage IV lung cancer awaiting tissue dx Hx of R BKA  Significant Hospital Events: Including procedures, antibiotic start and stop dates in addition to other pertinent events   9/20 arrived to pre-op ill appearing  Interim History / Subjective:  Extremely weak and frail.  Very difficult to understand due to hoarseness and weakness.  Objective   Blood pressure 116/72, pulse (!) 114, temperature (!) 97.5 F (36.4 C), temperature source Oral, resp. rate 20, height 6' (1.829 m), weight 71.3 kg, SpO2 93 %.        Intake/Output Summary (Last 24 hours) at 08/23/2021 0855 Last data filed at 08/22/2021 1800 Gross per 24 hour  Intake 100 ml  Output --  Net 100 ml   Filed Weights   08/22/21 1143  Weight: 71.3 kg    Examination: General: Frail elderly male who is difficult to understand due to weakness and hoarseness HEENT: No JVD is appreciated.  Voice is extremely hoarse. Neuro: Awake follows commands weak and difficult to understand due to hoarseness CV: Heart sounds are distant PULM: Decreased breath sounds right greater than left currently on 2 L nasal cannula 94% sats GI: soft, bsx4 active  GU: Extremities: warm/dry, negative edema right BKA     Skin: Multiple ecchymotic areas on left  arm   Resolved Hospital Problem list   N/a  Assessment & Plan:  Acute on chronic hypoxic respiratory with known progressive lung cancer causing impingement of left mainstem.  He was to have fiberoptic bronchoscopy on 08/22/2021 but is too weak for interventions at this point. Admitted to the hospital tried hospital service to pick up on 08/23/2021 Continue O2 as needed Antimicrobial therapy for suspected pneumonia Further conversations concerning comfort care in the future.  Failure to thrive in a male with known progressive lung cancer Palliative care consult  Rest per primary     Best Practice (right click and "Reselect all SmartList Selections" daily)   Per primary  Labs   CBC: Recent Labs  Lab 08/17/21 0531 08/18/21 0621 08/22/21 1534 08/23/21 0234  WBC 3.6* 3.8* 4.8 4.1  NEUTROABS  --   --  1.8  --   HGB 11.9* 12.9* 12.7* 11.6*  HCT 35.0* 37.5* 36.3* 34.2*  MCV 88.6 87.8 85.8 87.5  PLT 40* 41* 39* 33*    Basic Metabolic Panel: Recent Labs  Lab 08/17/21 0531 08/18/21 0621 08/22/21 1534 08/23/21 0234  NA 136 137 135 136  K 3.3* 3.5 4.1 3.7  CL 93* 92* 89* 91*  CO2 31 32 32 35*  GLUCOSE 107* 132* 160* 131*  BUN 8 12 19 22   CREATININE 0.60* 0.58* 0.98 0.87  CALCIUM 8.7* 9.5 9.8 9.4  MG 1.9 1.8 1.8 1.9   GFR: Estimated Creatinine Clearance: 83.1 mL/min (by C-G formula based on SCr  of 0.87 mg/dL). Recent Labs  Lab 08/17/21 0531 08/18/21 0621 08/22/21 1534 08/23/21 0234  WBC 3.6* 3.8* 4.8 4.1    Liver Function Tests: Recent Labs  Lab 08/17/21 0531 08/18/21 0621 08/22/21 1534  AST 54* 72* 98*  ALT 24 25 50*  ALKPHOS 154* 172* 181*  BILITOT 1.0 0.7 1.1  PROT 5.8* 6.1* 6.3*  ALBUMIN 2.5* 2.6* 2.3*   No results for input(s): LIPASE, AMYLASE in the last 168 hours. No results for input(s): AMMONIA in the last 168 hours.  ABG No results found for: PHART, PCO2ART, PO2ART, HCO3, TCO2, ACIDBASEDEF, O2SAT   Coagulation Profile: No results for  input(s): INR, PROTIME in the last 168 hours.  Cardiac Enzymes: No results for input(s): CKTOTAL, CKMB, CKMBINDEX, TROPONINI in the last 168 hours.  HbA1C: Hemoglobin A1C  Date/Time Value Ref Range Status  02/19/2017 02:27 PM 5.1  Final  05/11/2016 01:44 PM 4.9  Final    CBG: No results for input(s): GLUCAP in the last 168 hours.    [name] Minor ACNP Acute Care Nurse Practitioner [place] Please consult Amion 08/23/2021, 8:55 AM  I have taken an interval history, reviewed the chart and examined the patient. I agree with the Advanced Practitioner's note, impression, and recommendations as outlined.   Briefly, 70 year old male with bulky mediastinal adenopathy who was scheduled for bronchoscopy 9/20 however procedure was cancelled due to hemodynamic instability including tachycardia and hypoxemia  S: His pain has improved with medications.  O: Blood pressure 106/71, pulse (!) 111, temperature 98.1 F (36.7 C), temperature source Oral, resp. rate (!) 25, height 6' (1.829 m), weight 71.3 kg, SpO2 96 %.    Intake/Output from previous day:  Intake/Output Summary (Last 24 hours) at 08/23/2021 1609 Last data filed at 08/22/2021 1800 Gross per 24 hour  Intake 100 ml  Output --  Net 100 ml    On my exam: Cachectic chronically ill appearing male. Awake alert x 4. Follows commands. Hoarse voice. Lungs diminished breath sounds bilaterally. Heart mild tachycardia.  Interim Data: Lab Results  Component Value Date   WBC 4.1 08/23/2021   HGB 11.6 (L) 08/23/2021   HCT 34.2 (L) 08/23/2021   MCV 87.5 08/23/2021   PLT 33 (L) 08/23/2021   Lab Results  Component Value Date   NA 136 08/23/2021   K 3.7 08/23/2021   CL 91 (L) 08/23/2021   CO2 35 (H) 08/23/2021   GLUCOSE 131 (H) 08/23/2021   BUN 22 08/23/2021   CREATININE 0.87 08/23/2021   CALCIUM 9.4 08/23/2021    CTA 08/14/21 - Bulky mediastinal lymphadenopathy Independently reviewed and interpreted by  me.  Impression/Plan:  Acute hypoxemic respiratory failure secondary to RLL pneumonia COPD exacerbation Continue supplemental oxygen for goal SpO2 >90% Continue antibiotics for CAP coverage Steroids Continue Dulera BID Duonebs TID and PRN  Mediastinal adenopathy  Cancelled EBUS 9/20 Tentatively planned for 9/23 if he clinically improves  Goals of care Palliative consulted for FTT. Patient/family expressed desire for diagnostic testing. If patient is hemodynamically stable we can consider bronchoscopy again later this admission (however if he continues to decline and/or if patient decides to seek palliative options, this would be reasonable as I am concerned that despite testing, he would not be able to tolerate treatment. -Continue GOC discussions  Rodman Pickle, M.D. [facility] Pulmonary/Critical Care Medicine 08/23/2021 4:09 PM
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
## 2021-08-24 ENCOUNTER — Ambulatory Visit (HOSPITAL_COMMUNITY): Payer: Medicare Other | Admitting: Hematology

## 2021-08-24 ENCOUNTER — Encounter (HOSPITAL_COMMUNITY): Payer: Self-pay | Admitting: Anesthesiology

## 2021-08-24 DIAGNOSIS — R599 Enlarged lymph nodes, unspecified: Secondary | ICD-10-CM | POA: Diagnosis not present

## 2021-08-24 DIAGNOSIS — R0602 Shortness of breath: Secondary | ICD-10-CM | POA: Diagnosis not present

## 2021-08-24 DIAGNOSIS — Z515 Encounter for palliative care: Secondary | ICD-10-CM | POA: Diagnosis not present

## 2021-08-24 DIAGNOSIS — J9621 Acute and chronic respiratory failure with hypoxia: Secondary | ICD-10-CM

## 2021-08-24 DIAGNOSIS — R069 Unspecified abnormalities of breathing: Secondary | ICD-10-CM | POA: Diagnosis not present

## 2021-08-24 DIAGNOSIS — R918 Other nonspecific abnormal finding of lung field: Secondary | ICD-10-CM | POA: Diagnosis not present

## 2021-08-24 DIAGNOSIS — J449 Chronic obstructive pulmonary disease, unspecified: Secondary | ICD-10-CM | POA: Diagnosis not present

## 2021-08-24 DIAGNOSIS — F419 Anxiety disorder, unspecified: Secondary | ICD-10-CM | POA: Diagnosis not present

## 2021-08-24 LAB — BASIC METABOLIC PANEL
Anion gap: 9 (ref 5–15)
BUN: 21 mg/dL (ref 8–23)
CO2: 33 mmol/L — ABNORMAL HIGH (ref 22–32)
Calcium: 9 mg/dL (ref 8.9–10.3)
Chloride: 95 mmol/L — ABNORMAL LOW (ref 98–111)
Creatinine, Ser: 0.66 mg/dL (ref 0.61–1.24)
GFR, Estimated: >60 mL/min (ref >60)
Glucose, Bld: 183 mg/dL — ABNORMAL HIGH (ref 70–99)
Potassium: 4 mmol/L (ref 3.5–5.1)
Sodium: 137 mmol/L (ref 135–145)

## 2021-08-24 LAB — CBC
HCT: 32.3 % — ABNORMAL LOW (ref 39.0–52.0)
Hemoglobin: 11.1 g/dL — ABNORMAL LOW (ref 13.0–17.0)
MCH: 29.7 pg (ref 26.0–34.0)
MCHC: 34.4 g/dL (ref 30.0–36.0)
MCV: 86.4 fL (ref 80.0–100.0)
Platelets: 27 10*3/uL — CL (ref 150–400)
RBC: 3.74 MIL/uL — ABNORMAL LOW (ref 4.22–5.81)
RDW: 12.2 % (ref 11.5–15.5)
WBC: 3 10*3/uL — ABNORMAL LOW (ref 4.0–10.5)
nRBC: 4.6 % — ABNORMAL HIGH (ref 0.0–0.2)

## 2021-08-24 LAB — PATHOLOGIST SMEAR REVIEW: Path Review: BORDERLINE

## 2021-08-24 MED ORDER — SODIUM CHLORIDE 0.9 % IV SOLN
1.0000 g | INTRAVENOUS | Status: DC
  Administered 2021-08-24 – 2021-08-25 (×2): 1 g via INTRAVENOUS
  Filled 2021-08-24 (×2): qty 10

## 2021-08-24 MED ORDER — OXYCODONE HCL 5 MG PO TABS
5.0000 mg | ORAL_TABLET | ORAL | Status: DC | PRN
Start: 2021-08-24 — End: 2021-08-26
  Administered 2021-08-25: 5 mg via ORAL
  Filled 2021-08-24: qty 1

## 2021-08-24 NOTE — Progress Notes (Signed)
PT Cancellation Note  Patient Details Name: [name] MRN: [id number] DOB: 1951/03/26   Cancelled Treatment:    Reason Eval/Treat Not Completed: Patient declined, no reason specified.  Pt just finished eating (very little) and had not energy or desire to do anything at this time.  Will try back on 9/23 as able to complete the evaluation. 08/24/2021  Ginger Carne., PT Acute Rehabilitation Services [phone]  (pager) [phone]  (office)   Tessie Fass Lanessa Shill 08/24/2021, 5:57 PM

## 2021-08-24 NOTE — Progress Notes (Signed)
   NAME:  [name], MRN:  [id number], DOB:  1950/12/30, LOS: 2 ADMISSION DATE:  08/22/2021, CONSULTATION DATE:  08/22/21 REFERRING MD:  Valeta Harms, CHIEF COMPLAINT:  SOB   History of Present Illness:  70 year old man presenting from SNF with acute on chronic failure to thrive, anorexia, weight loss, hemoptysis found to have acute on chronic hypoxemic respiratory failure.  He was scheduled for bronchoscopy today but was too unstable with high HR and worsening O2 need.  PCCM to admit with TRH taking over starting 9/21.  Patient denies any recent fevers, just loss of voice, fatigue, and inability to tolerate PO.  Labs/imaging etc pending.  Pertinent  Medical History  COPD GERD Anxiety Stage IV lung cancer awaiting tissue dx Hx of R BKA  Significant Hospital Events: Including procedures, antibiotic start and stop dates in addition to other pertinent events   9/20 arrived to pre-op ill appearing  Interim History / Subjective:  Extremely weak and frail.  Very difficult to understand due to hoarseness and weakness.  Objective   Blood pressure 100/63, pulse (!) 114, temperature 98 F (36.7 C), temperature source Oral, resp. rate 19, height 6' (1.829 m), weight 71.3 kg, SpO2 92 %.        Intake/Output Summary (Last 24 hours) at 08/24/2021 1658 Last data filed at 08/24/2021 1330 Gross per 24 hour  Intake 1120 ml  Output 375 ml  Net 745 ml   Filed Weights   08/22/21 1143  Weight: 71.3 kg   Physical Exam: General: Frail elderly male-appearing, no acute distress HENT: [place], AT Eyes: EOMI, no scleral icterus Respiratory: Diminished right lower lobe.  No crackles, wheezing or rales Cardiovascular: Tachycardic, RR, -M/R/G, no JVD Neuro: AAO x4, CNII-XII grossly intact Psych: Normal mood, normal affect      Skin: Multiple ecchymotic areas on left arm   Resolved Hospital Problem list   N/a  Assessment & Plan:   Acute hypoxemic respiratory failure secondary to RLL pneumonia COPD  exacerbation Continue supplemental oxygen for goal SpO2 >90% Continue antibiotics for CAP coverage Steroids Continue Dulera BID Duonebs TID and PRN  Mediastinal adenopathy  Cancelled EBUS 9/20 Postponed to 9/26 pending clinical improvement  Thrombocytopenia No evidence of bleeding Prior to procedure will need to transfuse for goal >30k  Goals of care Palliative consulted for FTT.  9/22 Patient/family expressed desire for diagnostic testing. We discussed risks and benefits of bronchoscopy and patient wished to pursue to know. However if he continues to decline and/or if patient decides to seek palliative options, it would be reasonable to cancel as I am concerned that despite testing, he would not be able to tolerate treatment.  -Continue GOC discussions   Best Practice (right click and "Reselect all SmartList Selections" daily)   Per primary  Rodman Pickle, M.D. [facility] Pulmonary/Critical Care Medicine 08/24/2021 4:59 PM   See Amion for personal pager For hours between 7 PM to 7 AM, please call El[place] for urgent questions
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
## 2021-08-24 NOTE — Progress Notes (Signed)
   08/24/21 1210  Clinical Encounter Type  Visited With Patient and family together  Visit Type Follow-up;Spiritual support  Referral From Chaplain  Consult/Referral To [place] visit. The patient appeared to be sleeping. The patient's two sisters were at the bedside. Chaplain offered prayer. This note was prepared by Jeanine Luz, M.Div..  For questions please contact by phone [phone].

## 2021-08-24 NOTE — Progress Notes (Signed)
HOSPITAL MEDICINE OVERNIGHT EVENT NOTE    Notified by nursing that patient's platelet count is 27 this morning.  This is compared to 48 yesterday.  Per my discussion with nursing patient is exhibiting no evidence of significant bruising or bleeding.  Continuing supportive care, avoiding anticoagulants, continue to monitor platelet counts with serial CBCs.  In the absence of bleeding no platelet transfusion required.  Will defer further work-up or need for hematologic consultation to day provider.  Vernelle Emerald  MD [facility]

## 2021-08-24 NOTE — Progress Notes (Signed)
Speech Language Pathology Treatment: Dysphagia  Patient Details Name: [name] MRN: [id number] DOB: December 07, 1950 Today's Date: 08/24/2021 Time: 3329-5188 SLP Time Calculation (min) (ACUTE ONLY): 20 min  Assessment / Plan / Recommendation Clinical Impression  Pt was seen for dysphagia treatment. He reported fatigue and requested that p.o. intake be deferred since he had already had all he wanted of breakfast. Pt stated that he does not mind the current diet and that he has noticed reduced coughing during p.o. intake. Pt was educated regarding the results of the modified barium swallow study, and diet recommendations. Video recording of the study was used to facilitate education and he verbalized understanding regarding all areas of education. Pt was educated regarding his current aspiration risk and on swallowing precautions to reduced aspiration risk and pulmonary complications. Pt verbalized understanding and stated that he will attempt the chin tuck with left head turn. Pt was falling asleep intermittently throughout the session and reported that he was very tired. SLP suspects that subsequent re-education will be necessary. SLP will continue to follow pt.    HPI HPI: 70 year old man presenting from SNF with acute on chronic failure to thrive, anorexia, weight loss, hemoptysis found to have acute on chronic hypoxemic respiratory failure.  Pt with advanced lung cancer. He was scheduled for bronchoscopy today but was too unstable with high HR and worsening O2 need.  Pt with recent loss of voice and "inability to tolerate PO". CT of neck 9/13 noted: "Hyperenhancing mass adjacent to the left transverse process of C1 with expansion of the adjacent left obliquus capitis inferior muscle, most consistent with metastatic disease". Pt was to have fiberoptic bronchoscopy on 08/22/2021 but was too weak for interventions.      SLP Plan  Continue with current plan of care      Recommendations for follow up  therapy are one component of a multi-disciplinary discharge planning process, led by the attending physician.  Recommendations may be updated based on patient status, additional functional criteria and insurance authorization.    Recommendations  Diet recommendations: Dysphagia 2 (fine chop);Nectar-thick liquid Liquids provided via: Cup;No straw Medication Administration: Crushed with puree Supervision: Full supervision/cueing for compensatory strategies;Staff to assist with self feeding Compensations: Slow rate;Small sips/bites;Follow solids with liquid;Chin tuck (chin tuck with left head turn) Postural Changes and/or Swallow Maneuvers: Seated upright 90 degrees                Follow up Recommendations: Skilled Nursing facility SLP Visit Diagnosis: Dysphagia, oropharyngeal phase (R13.12) Plan: Continue with current plan of care       Savayah Waltrip I. Hardin Negus, [place], [place] Office number [phone] Pager [place]  08/24/2021, 9:53 AM

## 2021-08-24 NOTE — Plan of Care (Signed)
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
## 2021-08-24 NOTE — TOC Initial Note (Signed)
Transition of Care [facility]) - Initial/Assessment Note    Patient Details  Name: [name] MRN: [id number] Date of Birth: 07-29-1951  Transition of Care [facility] CM/SW Contact:    Angelita Ingles, RN Phone [phone]  08/24/2021, 2:30 PMlinical Narrative:                 TOC following for patient from [facility] in [place]. CM at bedside patient does not engage in conversation. Patient is able to respond to his name but then begins to [facility] and sister at bed side attends to him. Other sister at bedside [name] is able to answer questions for the patient. Per sister [name] patient is from [facility] rehab in [place]. Sister states that patient was originally at [facility] assisted living and was discharged to [facility] for rehab. Sisters took patient to appointment with his MD and MD referred him to [facility] where he has been admitted. Sister states that she does not feel that patient will be able to return to [place]. Family is ok with patient returning to [facility] when medically ready for d/c. CM spoke with Jackelyn Poling at [facility] to confirm that patient will be able to return upon discharge. TOC will continue to follow for disposition needs.   Expected Discharge Plan: Skilled Nursing Facility Barriers to Discharge: No Barriers Identified   Patient Goals and CMS Choice Patient states their goals for this hospitalization and ongoing recovery are:: Patient has no response sister states goal is to get strong enough to go to assisted living CMS Medicare.gov Compare Post Acute Care list provided to:: Patient Represenative (must comment) (sisters at bedside [place])    Expected Discharge Plan and Services Expected Discharge Plan: [place] In-house Referral: NA Discharge Planning Services: CM Consult, NA Post Acute Care Choice: NA Living arrangements for the past 2 months: [place]                 DME Arranged: N/A DME Agency: NA       HH Arranged: NA [place] Agency: NA         Prior Living Arrangements/Services Living arrangements for the past 2 months: [place] Lives with:: Facility Resident Patient language and need for interpreter reviewed:: Yes Do you feel safe going back to the place where you live?: Yes      Need for Family Participation in Patient Care: Yes (Comment) Care giver support system in place?: Yes (comment) Current home services:  (n/a) Criminal Activity/Legal Involvement Pertinent to Current Situation/Hospitalization: No - Comment as needed  Activities of Daily Living Home Assistive Devices/Equipment: Environmental consultant (specify type), Wheelchair ADL Screening (condition at time of admission) Patient's cognitive ability adequate to safely complete daily activities?: Yes Is the patient deaf or have difficulty hearing?: No Does the patient have difficulty seeing, even when wearing glasses/contacts?: No Does the patient have difficulty concentrating, remembering, or making decisions?: No Patient able to express need for assistance with ADLs?: Yes Does the patient have difficulty dressing or bathing?: Yes Independently performs ADLs?: No Communication: Independent Dressing (OT): Needs assistance Is this a change from baseline?: Change from baseline, expected to last >3 days Grooming: Needs assistance Is this a change from baseline?: Change from baseline, expected to last >3 days Feeding: Independent Bathing: Needs assistance Is this a change from baseline?: Change from baseline, expected to last >3 days Toileting: Independent In/Out Bed: Needs assistance Is this a change from baseline?: Change from baseline, expected to last >3 days Walks in Home:  Needs assistance Is this a change from baseline?: Change from baseline, expected to last >3 days Does the patient have difficulty walking or climbing stairs?: Yes Weakness of Legs: Both Weakness of Arms/Hands: None  Permission Sought/Granted Permission sought to share information  with : Family Supports Permission granted to share information with : Yes, Verbal Permission Granted  Share Information with NAME: [name] and Vaughan Basta sisters           Emotional Assessment Appearance:: Appears stated age Attitude/Demeanor/Rapport: Other (comment) (not engaged) Affect (typically observed): Flat Orientation: : Oriented to Self, Fluctuating Orientation (Suspected and/or reported [place]) (sister reports fluctuating orientation) Alcohol / Substance Use: Not Applicable Psych Involvement: No (comment)  Admission diagnosis:  Pneumonia [J18.9] Respiration abnormal [R06.9] Patient Active Problem List   Diagnosis Date Noted   Acute on chronic respiratory failure with hypoxia ([place]) 08/24/2021   Respiration abnormal 08/23/2021   Pneumonia 08/22/2021   SOB (shortness of breath)    DNR (do not resuscitate)    Palliative care by specialist    Anxiety    Protein-calorie malnutrition, severe 08/16/2021   Dehydration 08/15/2021   Lactic acidosis 08/15/2021   Thrombocytopenia ([place]) 08/15/2021   Hyperglycemia 08/15/2021   Transaminitis 08/15/2021   Metastatic lung cancer (metastasis from lung to other site) ([place]) 08/15/2021   Hyponatremia 08/15/2021   Lung mass 08/10/2021   Adenopathy 08/10/2021   Mediastinal adenopathy 07/26/2021   Decreased breath sounds 04/07/2021   Cough productive of purulent sputum 04/07/2021   Bronchial irritation 04/07/2021   Acute bronchitis with COPD ([place]) 04/07/2021   Acute rhinosinusitis 04/07/2021   Aortic atherosclerosis ([place]) 02/26/2021   Encounter for chronic pain management 05/10/2020   Pneumonia due to COVID-19 virus 12/02/2019   Chronic obstructive pulmonary disease ([place]) 02/02/2018   GERD (gastroesophageal reflux disease) 08/29/2017   Chronic leg pain 12/15/2015   History of left below knee amputation ([place]) 08/31/2015   Hyperlipidemia 02/18/2014   Other chronic pain 07/10/2013   Insomnia 07/10/2013   Generalized anxiety disorder  07/10/2013   PCP:  [name], MD Pharmacy:   [facility], [place] [address] [place] [address] Phone: [phone] Fax: [phone]     Social Determinants of Health (SDOH) Interventions    Readmission Risk Interventions Readmission Risk Prevention Plan 08/24/2021  Transportation Screening Complete  PCP or Specialist Appt within 5-7 Days Complete  Home Care Screening Complete  Medication Review (RN CM) Referral to Pharmacy  Some recent data might be hidden

## 2021-08-24 NOTE — Progress Notes (Signed)
Occupational Therapy Evaluation completed with full note to follow. Anticipate he will require return to SNF at discharge.   Nilsa Nutting., OTR/L Acute Rehabilitation Services Pager [phone] Office [phone]

## 2021-08-25 ENCOUNTER — Inpatient Hospital Stay (HOSPITAL_COMMUNITY): Payer: Medicare Other

## 2021-08-25 DIAGNOSIS — F419 Anxiety disorder, unspecified: Secondary | ICD-10-CM | POA: Diagnosis not present

## 2021-08-25 DIAGNOSIS — R599 Enlarged lymph nodes, unspecified: Secondary | ICD-10-CM | POA: Diagnosis not present

## 2021-08-25 DIAGNOSIS — J449 Chronic obstructive pulmonary disease, unspecified: Secondary | ICD-10-CM | POA: Diagnosis not present

## 2021-08-25 DIAGNOSIS — Z515 Encounter for palliative care: Secondary | ICD-10-CM | POA: Diagnosis not present

## 2021-08-25 DIAGNOSIS — J9621 Acute and chronic respiratory failure with hypoxia: Secondary | ICD-10-CM | POA: Diagnosis not present

## 2021-08-25 DIAGNOSIS — R590 Localized enlarged lymph nodes: Secondary | ICD-10-CM

## 2021-08-25 DIAGNOSIS — Z7189 Other specified counseling: Secondary | ICD-10-CM | POA: Diagnosis not present

## 2021-08-25 MED ORDER — HYDROCOD POLST-CPM POLST ER 10-8 MG/5ML PO SUER
5.0000 mL | Freq: Two times a day (BID) | ORAL | Status: DC
  Administered 2021-08-25 – 2021-08-26 (×3): 5 mL via ORAL
  Filled 2021-08-25 (×3): qty 5

## 2021-08-25 MED ORDER — METOPROLOL TARTRATE 50 MG PO TABS
50.0000 mg | ORAL_TABLET | Freq: Two times a day (BID) | ORAL | Status: DC
  Administered 2021-08-25 – 2021-08-26 (×3): 50 mg via ORAL
  Filled 2021-08-25 (×3): qty 1

## 2021-08-25 MED ORDER — GUAIFENESIN-DM 100-10 MG/5ML PO SYRP: 10.0000 mL | ORAL_SOLUTION | ORAL | Status: DC | PRN

## 2021-08-25 MED ORDER — IPRATROPIUM-ALBUTEROL 0.5-2.5 (3) MG/3ML IN SOLN
3.0000 mL | Freq: Three times a day (TID) | RESPIRATORY_TRACT | Status: DC
  Administered 2021-08-25 – 2021-08-30 (×15): 3 mL via RESPIRATORY_TRACT
  Filled 2021-08-25 (×13): qty 3

## 2021-08-25 NOTE — Progress Notes (Signed)
Patient refused CPT at this time.

## 2021-08-25 NOTE — Progress Notes (Signed)
PT Cancellation Note  Patient Details Name: [name] MRN: [id number] DOB: 02/07/1951   Cancelled Treatment:    Reason Eval/Treat Not Completed: Other (comment) (Palliative is helping pt/family with decisions to move toward comfort care.  Pt deferred today, but will go ahead and signoff in anticipation of comfort.) 08/25/2021  Ginger Carne., PT Acute Rehabilitation Services [phone]  (pager) [phone]  (office)   Tessie Fass Deavion Strider 08/25/2021, 5:59 PM

## 2021-08-25 NOTE — Progress Notes (Signed)
   NAME:  [name], MRN:  [id number], DOB:  [birth year], LOS: 3 ADMISSION DATE:  08/22/2021, CONSULTATION DATE:  08/22/21 REFERRING MD:  Valeta Harms, CHIEF COMPLAINT:  SOB   History of Present Illness:  70 year old man presenting from SNF with acute on chronic failure to thrive, anorexia, weight loss, hemoptysis found to have acute on chronic hypoxemic respiratory failure.  He was scheduled for bronchoscopy 9/20 but was too unstable with high HR and worsening O2 need.  PCCM to admit with TRH taking over starting 9/21.    Pertinent  Medical History  COPD GERD Anxiety Stage IV lung cancer awaiting tissue dx Hx of R BKA  Significant Hospital Events: Including procedures, antibiotic start and stop dates in addition to other pertinent events   9/20 arrived to pre-op ill appearing  Interim History / Subjective:  More hypoxic, placed on nonrebreather this morning. Saturation 100%. Afebrile  Objective   Blood pressure (!) 114/54, pulse (!) 120, temperature 98 F (36.7 C), temperature source Oral, resp. rate (!) 31, height 6' (1.829 m), weight 71.3 kg, SpO2 90 %.        Intake/Output Summary (Last 24 hours) at 08/25/2021 1020 Last data filed at 08/25/2021 0000 Gross per 24 hour  Intake 480 ml  Output 1100 ml  Net -620 ml    Filed Weights   08/22/21 1143  Weight: 71.3 kg   Physical Exam: General: Frail elderly male-appearing, in mild distress HENT: [place], AT Eyes: EOMI, no scleral icterus Respiratory: Mild accessory muscle use, decreased breath sounds on right, no rhonchi Cardiovascular: S1-S2 tacky, no murmurs Neuro: AAO x4, CNII-XII grossly intact Psych: Normal mood, normal affect  Skin: Multiple ecchymotic areas on left arm   Chest x-ray independently reviewed, right-sided airspace disease with volume loss on right Labs show normal electrolytes, stable anemia, severe thrombocytopenia  Resolved Hospital Problem list   N/a  Assessment & Plan:   Acute hypoxemic respiratory  failure secondary to RLL pneumonia Possible postobstructive pneumonia COPD exacerbation Continue supplemental oxygen for goal SpO2 >90% Ceftriaxone/azithromycin x 5-7 ds IV Solu-Medrol 40 daily Continue Dulera BID Duonebs TID and PRN  Mediastinal adenopathy  Cancelled EBUS 9/20 Postponed to 9/26 pending clinical improvement  Thrombocytopenia No evidence of bleeding Prior to procedure will need to transfuse for goal >50k  Goals of care Palliative consulted for FTT.  9/22 Patient/family expressed desire for diagnostic testing.  9/23 hypoxia is worse now requiring nonrebreather.  Certainly not a candidate for invasive procedures-we can only cause him more harm than good with worsening respiratory failure and risk of bleeding due to severe thrombocytopenia.  Even with biopsy diagnosis, he would not be a candidate for chemotherapy Suggest that we continue palliative discussions PCCM to see again on Monday  Kara Mead MD. [facility]. [place] [facility] Pager : [phone]  If no response to pager , please call [phone] until 7 pm After 7:00 pm call Elink  [phone]    08/25/2021 10:20 AM

## 2021-08-25 NOTE — Evaluation (Addendum)
Occupational Therapy Evaluation (late entry) Patient Details Name: [name] MRN: [id number] DOB: 1951/07/21 Today's Date: 08/25/2021       Clinical Impression   Pt admitted with above. He demonstrates the below listed deficits and will benefit from continued OT to maximize safety and independence with BADLs.  Eval limited by pt fatigue and RHR 113 with Sp02 89% on 6L 02.  He is mod I with bed mobility, and Is able to self feed.  He requires extensive assist with the remainder of his ADLs due to low activity tolerance.  He has been residing at [facility] since his last admission, and has required extensive assist for ADLs and functional mobility since that time.  Prior to that, he resided at ALF and was mod I with ADLs.  Will follow for OT acutely within pt's tolerance.       Recommendations for follow up therapy are one component of a multi-disciplinary discharge planning process, led by the attending physician.  Recommendations may be updated based on patient status, additional functional criteria and insurance authorization.     08/24/21 1700  OT Visit Information  Last OT Received On 08/24/21  Assistance Needed +1  History of Present Illness pt is a 70 y/o male presenting from SNF on 9/20 with acute on chronic FTT, anorexia, weight loss, hemoptysis, was found to have acute on chronic hypoxemic respirotory failure.  Recently found R LL bronchogenic carcinoma on imaging.  scheduled bronchosopy on 9/20 cancelled due to worsening hypoxic respiratory failure and persistent tachycardia.  PMHx:  CA, COPD, BKA, anxiety d/o.  Precautions  Precautions Fall  Home Living  Family/patient expects to be discharged to: Skilled nursing facility  Additional Comments Prior to recent SNF admission, he was residing at ALF  Prior Function  Level of Independence Independent with assistive device(s)  Gait / Transfers Assistance Needed Pt was mod I with ambulation with prosthesis previously, but has not ambulated  since admission to SNF  ADL's / [place] report pt was mod I with ADLs prior recent admission, but since last hospitalization and recent SNF stay, he has required assist for all aspects  Communication  Communication Other (comment) (low volume)  Pain Assessment  Pain Assessment 0-10  Pain Score 6  Pain Location "everywhere"  Pain Descriptors / Indicators Aching  Pain Intervention(s) Monitored during session;Repositioned  Cognition  Arousal/Alertness Awake/alert;Lethargic  Behavior During Therapy WFL for tasks assessed/performed  Overall Cognitive Status Within Functional Limits for tasks assessed  Upper Extremity Assessment  Upper Extremity Assessment Generalized weakness  Lower Extremity Assessment  Lower Extremity Assessment Defer to PT evaluation  ADL  Overall ADL's  Needs assistance/impaired  Eating/Feeding Independent  Grooming Wash/dry face;Wash/dry hands;Oral care;Brushing hair;Set up;Bed level  Upper Body Bathing Maximal assistance;Bed level  Lower Body Bathing Maximal assistance;Bed level  Upper Body Dressing  Maximal assistance;Bed level  Lower Body Dressing Maximal assistance;Bed level  Toilet Transfer Total assistance  Toilet Transfer Details (indicate cue type and reason) unable to attempt due to fatigue  Toileting- Clothing Manipulation and Hygiene Maximal assistance;Bed level  General ADL Comments limited by fatigue  Bed Mobility  Overal bed mobility Modified Independent  Transfers  General transfer comment unable to attempt due to fatigue  Balance  Standing balance comment deferred  General Comments  General comments (skin integrity, edema, etc.) RHR 113 in supine, sp02 89% on 6L supplemental 02  OT - End of Session  Activity Tolerance Patient limited by fatigue  Patient left in bed;with call  bell/phone within reach;with bed alarm set;with family/visitor present  Nurse Communication Mobility status  OT Assessment  OT  Recommendation/Assessment Patient needs continued OT Services  OT Visit Diagnosis Unsteadiness on feet (R26.81);Adult, failure to thrive (R62.7);Pain  OT Problem List Decreased strength;Decreased activity tolerance;Impaired balance (sitting and/or standing);Cardiopulmonary status limiting activity;Pain  OT Plan  OT Frequency (ACUTE ONLY) Min 2X/week  OT Treatment/Interventions (ACUTE ONLY) Self-care/ADL training;Therapeutic exercise;Energy conservation;Therapeutic activities;Patient/family education;Balance training;DME and/or AE instruction  AM-PAC OT "6 Clicks" Daily Activity Outcome Measure (Version 2)  Help from another person eating meals? 4  Help from another person taking care of personal grooming? 3  Help from another person toileting, which includes using toliet, bedpan, or urinal? 2  Help from another person bathing (including washing, rinsing, drying)? 2  Help from another person to put on and taking off regular upper body clothing? 2  Help from another person to put on and taking off regular lower body clothing? 2  6 Click Score 15  Progressive Mobility  What is the highest level of mobility based on the progressive mobility assessment? Level 1 (Bedfast) - Unable to balance while sitting on edge of bed  Mobility Sit up in bed/chair position for meals  OT Recommendation  Follow Up Recommendations SNF  OT Equipment None recommended by OT  Individuals Consulted  Consulted and Agree with Results and Recommendations Patient  Acute Rehab OT Goals  Patient Stated Goal to get stronger  OT Goal Formulation With patient/family  Time For Goal Achievement 09/09/21  Potential to Achieve Goals Fair  OT Time Calculation  OT Start Time (ACUTE ONLY) 1040  OT Stop Time (ACUTE ONLY) 1106  OT Time Calculation (min) 26 min  OT General Charges  $OT Visit 1 Visit  OT Evaluation  $OT Eval Moderate Complexity 1 Mod  OT Treatments  $Therapeutic Activity 8-22 mins  Written Expression  Dominant  Hand Right                                                                                                            Nilsa Nutting., OTR/L Acute Rehabilitation Services Pager [phone] Office [phone]   Lucille Passy M 08/25/2021, 5:27 AM

## 2021-08-25 NOTE — Progress Notes (Signed)
Patient switched to Venturi Mask 14 L, 55% per MD, at this time. SATs went from 98% to 96% currently. Will continue to monitor and decrease FiO2 as tolerated.

## 2021-08-25 NOTE — Progress Notes (Signed)
   08/25/21 1925  Therapy Vitals  Pulse Rate (!) 121  Resp (!) 23  MEWS Score/Color  MEWS Score 3  MEWS Score Color Yellow  Oxygen Therapy/Pulse Ox  SpO2 (!) 72 %  Placed pt. On NRB due to pt. Not having oxygen and saturation in the 70s

## 2021-08-25 NOTE — Progress Notes (Addendum)
This chaplain is present with the Pt., Pt. sisters-Linda and Zigmund Daniel, notary, and witnesses for notarizing of the Pt. Advance Directive: HCPOA.  The Pt. named [name] as HCPOA.  If the HCPOA is unwilling or unable to serve the Pt. has named [name] as his next choice.  The chaplain gave the Pt. the original AD and two copies. The chaplain scanned the Pt. AD into the Pt. EMR.  This chaplain is available for F/U spiritual care as needed.  **33 Chaplain shared prayer and storytelling with the Pt. and sisters.  Chaplain Sallyanne Kuster Pager [phone]

## 2021-08-25 NOTE — Progress Notes (Signed)
RT passing by room, responded to alarm to find patients SATs 72% with nasal cannula removed by patient due to bloody nose. Placed NRB on patient at this time. SATs back in the 90's after a few minutes. Informed patient of importance of leaving O2 on at this time. States he understands.

## 2021-08-25 NOTE — Progress Notes (Addendum)
This chaplain responded to the PMT consult for notarizing the Pt. Advance Directive.  The Pt. Is awake and able to communicate with non rebreather. The Pt. gave the chaplain permission to phone the Pt. sister-Linda for an update on the Pt. AD.  The chaplain phoned Linda(cell phone communication was not clear). The chaplain understands Vaughan Basta will be visiting the Pt. today.  Vaughan Basta is bringing the AD started at [place].  The chaplain will F/U with the Pt. and family in person, view the document, and schedule a notary visit if it is the appropriate next step.  **[id number] Chaplain joined Pt. And family. Pt. is ready to notarize AD. Notary request for 2pm.  Chaplain Sallyanne Kuster Pager [phone]

## 2021-08-25 NOTE — Progress Notes (Signed)
PROGRESS NOTE    [name]  YJE:[id number] DOB: May 04, 1951 DOA: 08/22/2021 PCP: [name], MD    Brief Narrative:  [name] was admitted to the hospital with the working diagnosis of worsening hypoxemic respiratory failure due to COPD exacerbation in the setting of likely right lower lobe bronchogenic carcinoma.    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  He has anorexia, weight loss and hemoptysis, diagnosed with likely lung cancer pending tissue biopsy. Positive COVID 19 on 07/2021. On his initial physical examination his blood pressure 139/92, heart rate 128, temperature 98.2, respiratory rate 25, oxygen saturation 88% on supplemental oxygen, he had decreased breath sounds bilaterally, heart S1-S2, present, tachycardic, irregular, abdomen soft, lower extremities with right BKA.   Sodium 135, potassium 4.1, chloride 89, bicarb 32, glucose 160, BUN 19, creatinine 0.98, magnesium 1.8, white count 4.8, hemoglobin 12.7, hematocrit 36.3, platelets 39. SARS COVID-19 negative.   Chest radiograph with hyperinflation, right lower lobe opacity, left base atelectasis.   EKG 123 bpm, normal axis, normal intervals, multifocal atrial tachycardia, no significant ST segment or T wave changes.    Patient placed on aggressive bronchodilator therapy and systemic steroids. Developed worsening thrombocytopenia.    Telemetry with multifocal atrial tachycardia and started on metoprolol  Patient very weak and deconditioned,continue to have high oxygen requirements.   Assessment & Plan:   Principal Problem:   Chronic obstructive pulmonary disease ([place]) Active Problems:   Other chronic pain   Lung mass   Adenopathy   DNR (do not resuscitate)   Palliative care by specialist    Anxiety   Respiration abnormal   Acute on chronic respiratory failure with hypoxia (HCC)   Right lower lobe bronchogenic carcinoma, pending tissue diagnosis (C1 metastatic disease) / COPD exacerbation with acute on chronic respiratory failure.  This am with worsening increase work of breathing, increase oxygen requirements, up to non re-breather mask 15 L/min.  Chest film personally reviewed with right lower love infiltrate.   Plan to continue respiratory support, attempt to transition to high flow nasal cannula.   Add chest PT and continue with airway clearing techniques with incentive spirometer and flutter valve. (Patient very weak and deconditioned).    On systemic corticosteroids and and aggressive bronchodilator therapy. Inhaled corticosteroids. Added ceftriaxone for suspected post -obstructive pneumonia, continue with azithromycin.   Pain control with oxycodone and antitussive agents.   Possible bronchoscopy on Monday.   Patient is DNR   2. Multifocal atrial tachycardia.  Persistent tachycardia with HR 120, MAT, will increase metoprolol to 50 mg po bid and continue telemetry monitoring. No clinical signs of pulmonary edema.   3. Swallow dysfunction. Dysphagia 3 diet. Patient has been asking for water.  Patient is very weak and deconditioned high risk for aspiration.    4. Anxiety/ depression. Continue with alprazolam and mirtazapine.     5. Thrombocytopenia, anemia and leukopenia. Continue to hold on enoxaparin. Follow cell count in am.   Likely multifactorial in the setting of lung cancer.   6. Hypomagnesemia and hypokalemia/ chronic metabolic alkalosis.  Follow up renal function in am.    7. Sever protein calorie malnutrition. Nutritional supplements.    Patient continue to be at high risk for worsening respiratory failure   Status is: Inpatient  Remains inpatient appropriate because:Inpatient level of care appropriate due to severity  of illness  Dispo: The  patient is from: SNF              Anticipated d/c is to: SNF              Patient currently is not medically stable to d/c.   Difficult to place patient No   DVT prophylaxis: Scd   Code Status:    DNR   Family Communication:   No family at the bedside      Nutrition Status: Nutrition Problem: Severe Malnutrition Etiology: chronic illness (COPD, Stage IV Lung Cancer) Signs/Symptoms: severe muscle depletion, severe fat depletion, percent weight loss, energy intake < or equal to 75% for > or equal to 1 month Percent weight loss: 10.2 % Interventions: Hormel Shake, MVI     Consultants:  Pulmonary    Antimicrobials:  Azithromycin and ceftriaxone     Subjective: Patient with increased work of breathing, positive worsening dyspnea and chest pain.  Asking for water, but patient on restrictions due to risk of aspiration.   Objective: Vitals:   08/25/21 0000 08/25/21 0433 08/25/21 0739 08/25/21 0754  BP: 99/62 113/78 (!) 114/54   Pulse: (!) 104 (!) 112 (!) 120   Resp: 20 (!) 25 (!) 31   Temp:  97.6 F (36.4 C) 98 F (36.7 C)   TempSrc:  Oral Oral   SpO2: 93% 92% (!) 87% 90%  Weight:      Height:        Intake/Output Summary (Last 24 hours) at 08/25/2021 2119 Last data filed at 08/25/2021 0000 Gross per 24 hour  Intake 720 ml  Output 1100 ml  Net -380 ml   Filed Weights   08/22/21 1143  Weight: 71.3 kg    Examination:   General: increased work of breathing, positive dyspnea at rest.  Neurology: Awake and alert, non focal  E ENT: no pallor, no icterus, oral mucosa moist, positive cyanosis.  Cardiovascular: No JVD. S1-S2 present, rhythmic, no gallops, rubs, or murmurs. No lower extremity edema. Pulmonary: positive breath sounds bilaterally, decreased air movement, no wheezing, bilateral rhonchi and rales. Gastrointestinal. Abdomen soft and non tender Skin. No rashes Musculoskeletal: no joint deformities     Data Reviewed: I have personally reviewed following  labs and imaging studies  CBC: Recent Labs  Lab 08/22/21 1534 08/23/21 0234 08/24/21 0219  WBC 4.8 4.1 3.0*  NEUTROABS 1.8  --   --   HGB 12.7* 11.6* 11.1*  HCT 36.3* 34.2* 32.3*  MCV 85.8 87.5 86.4  PLT 39* 33* 27*   Basic Metabolic Panel: Recent Labs  Lab 08/22/21 1534 08/23/21 0234 08/24/21 0219  NA 135 136 137  K 4.1 3.7 4.0  CL 89* 91* 95*  CO2 32 35* 33*  GLUCOSE 160* 131* 183*  BUN 19 22 21   CREATININE 0.98 0.87 0.66  CALCIUM 9.8 9.4 9.0  MG 1.8 1.9  --    GFR: Estimated Creatinine Clearance: 90.4 mL/min (by C-G formula based on SCr of 0.66 mg/dL). Liver Function Tests: Recent Labs  Lab 08/22/21 1534  AST 98*  ALT 50*  ALKPHOS 181*  BILITOT 1.1  PROT 6.3*  ALBUMIN 2.3*   No results for input(s): LIPASE, AMYLASE in the last 168 hours. No results for input(s): AMMONIA in the last 168 hours. Coagulation Profile: No results for input(s): INR, PROTIME in the last 168 hours. Cardiac Enzymes: No results for input(s): CKTOTAL, CKMB, CKMBINDEX, TROPONINI in the last 168 hours. BNP (last 3 results) No results for  input(s): PROBNP in the last 8760 hours. HbA1C: No results for input(s): HGBA1C in the last 72 hours. CBG: No results for input(s): GLUCAP in the last 168 hours. Lipid Profile: No results for input(s): CHOL, HDL, LDLCALC, TRIG, CHOLHDL, LDLDIRECT in the last 72 hours. Thyroid Function Tests: No results for input(s): TSH, T4TOTAL, FREET4, T3FREE, THYROIDAB in the last 72 hours. Anemia Panel: No results for input(s): VITAMINB12, FOLATE, FERRITIN, TIBC, IRON, RETICCTPCT in the last 72 hours.    Radiology Studies: I have reviewed all of the imaging during this hospital visit personally     Scheduled Meds:  azithromycin  500 mg Oral Daily   docusate sodium  100 mg Oral BID   megestrol  400 mg Oral BID   methylPREDNISolone (SOLU-MEDROL) injection  40 mg Intravenous Daily   metoprolol tartrate  50 mg Oral BID   mirtazapine  7.5 mg Oral QHS    mometasone-formoterol  2 puff Inhalation BID   multivitamin with minerals  1 tablet Oral Daily   nortriptyline  100 mg Oral QHS   pantoprazole  40 mg Oral Daily   polyethylene glycol  17 g Oral Daily   senna  1 tablet Oral BID   Continuous Infusions:  cefTRIAXone (ROCEPHIN)  IV 1 g (08/24/21 1718)     LOS: 3 days        Gerald Reinhardt Gerome Apley, MD
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
## 2021-08-25 NOTE — Care Management Important Message (Signed)
Important Message  Patient Details  Name: [name] MRN: [id number] Date of Birth: January 13, 1951   Medicare Important Message Given:  Yes     [name] 08/25/2021, 3:12 PM

## 2021-08-26 DIAGNOSIS — Z7189 Other specified counseling: Secondary | ICD-10-CM | POA: Diagnosis not present

## 2021-08-26 DIAGNOSIS — F419 Anxiety disorder, unspecified: Secondary | ICD-10-CM | POA: Diagnosis not present

## 2021-08-26 DIAGNOSIS — Z515 Encounter for palliative care: Secondary | ICD-10-CM | POA: Diagnosis not present

## 2021-08-26 DIAGNOSIS — D61818 Other pancytopenia: Secondary | ICD-10-CM

## 2021-08-26 DIAGNOSIS — J449 Chronic obstructive pulmonary disease, unspecified: Secondary | ICD-10-CM | POA: Diagnosis not present

## 2021-08-26 DIAGNOSIS — Z66 Do not resuscitate: Secondary | ICD-10-CM | POA: Diagnosis not present

## 2021-08-26 DIAGNOSIS — J9621 Acute and chronic respiratory failure with hypoxia: Secondary | ICD-10-CM | POA: Diagnosis not present

## 2021-08-26 DIAGNOSIS — C3490 Malignant neoplasm of unspecified part of unspecified bronchus or lung: Secondary | ICD-10-CM

## 2021-08-26 LAB — CBC
HCT: 31.2 % — ABNORMAL LOW (ref 39.0–52.0)
Hemoglobin: 10.8 g/dL — ABNORMAL LOW (ref 13.0–17.0)
MCH: 29.9 pg (ref 26.0–34.0)
MCHC: 34.6 g/dL (ref 30.0–36.0)
MCV: 86.4 fL (ref 80.0–100.0)
Platelets: 23 10*3/uL — CL (ref 150–400)
RBC: 3.61 MIL/uL — ABNORMAL LOW (ref 4.22–5.81)
RDW: 12.3 % (ref 11.5–15.5)
WBC: 3.2 10*3/uL — ABNORMAL LOW (ref 4.0–10.5)
nRBC: 8.8 % — ABNORMAL HIGH (ref 0.0–0.2)

## 2021-08-26 LAB — BASIC METABOLIC PANEL
Anion gap: 8 (ref 5–15)
BUN: 15 mg/dL (ref 8–23)
CO2: 37 mmol/L — ABNORMAL HIGH (ref 22–32)
Calcium: 9.3 mg/dL (ref 8.9–10.3)
Chloride: 90 mmol/L — ABNORMAL LOW (ref 98–111)
Creatinine, Ser: 0.58 mg/dL — ABNORMAL LOW (ref 0.61–1.24)
GFR, Estimated: >60 mL/min (ref >60)
Glucose, Bld: 110 mg/dL — ABNORMAL HIGH (ref 70–99)
Potassium: 4.4 mmol/L (ref 3.5–5.1)
Sodium: 135 mmol/L (ref 135–145)

## 2021-08-26 MED ORDER — LORAZEPAM 2 MG/ML IJ SOLN: 1.0000 mg | INTRAMUSCULAR | Status: DC | PRN

## 2021-08-26 MED ORDER — MORPHINE SULFATE (CONCENTRATE) 10 MG/0.5ML PO SOLN
5.0000 mg | ORAL | Status: DC | PRN
Start: 2021-08-26 — End: 2021-08-31
  Administered 2021-08-26 – 2021-08-30 (×6): 5 mg via ORAL
  Filled 2021-08-26 (×7): qty 0.5

## 2021-08-26 MED ORDER — MORPHINE SULFATE (CONCENTRATE) 10 MG/0.5ML PO SOLN
5.0000 mg | ORAL | Status: DC | PRN
  Administered 2021-08-27 – 2021-08-28 (×9): 5 mg via SUBLINGUAL
  Filled 2021-08-26 (×8): qty 0.5

## 2021-08-26 MED ORDER — POLYVINYL ALCOHOL 1.4 % OP SOLN
1.0000 [drp] | Freq: Four times a day (QID) | OPHTHALMIC | Status: DC | PRN
  Filled 2021-08-26: qty 15

## 2021-08-26 MED ORDER — BIOTENE DRY MOUTH MT LIQD: 15.0000 mL | OROMUCOSAL | Status: DC | PRN

## 2021-08-26 MED ORDER — BISACODYL 10 MG RE SUPP: 10.0000 mg | Freq: Every day | RECTAL | Status: DC | PRN

## 2021-08-26 MED ORDER — GLYCOPYRROLATE 0.2 MG/ML IJ SOLN: 0.2000 mg | INTRAMUSCULAR | Status: DC | PRN

## 2021-08-26 MED ORDER — LORAZEPAM 1 MG PO TABS
1.0000 mg | ORAL_TABLET | ORAL | Status: DC | PRN
  Administered 2021-08-30 (×2): 1 mg via ORAL
  Filled 2021-08-26 (×2): qty 1

## 2021-08-26 MED ORDER — GLYCOPYRROLATE 1 MG PO TABS: 1.0000 mg | ORAL_TABLET | ORAL | Status: DC | PRN

## 2021-08-26 MED ORDER — LORAZEPAM 2 MG/ML PO CONC: 1.0000 mg | ORAL | Status: DC | PRN

## 2021-08-26 NOTE — Plan of Care (Signed)
  Problem: Education: Goal: Knowledge of General Education information will improve Description Including pain rating scale, medication(s)/side effects and non-pharmacologic comfort measures Outcome: Progressing   

## 2021-08-26 NOTE — Progress Notes (Addendum)
PROGRESS NOTE    [name]  MBW:[id number] DOB: [birth year] DOA: 08/22/2021 PCP: [name], MD    Brief Narrative:  [name] was admitted to the hospital with the working diagnosis of worsening hypoxemic respiratory failure due to COPD exacerbation in the setting of likely right lower lobe bronchogenic carcinoma and post-obstructive pneumonia.    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  He has anorexia, weight loss and hemoptysis, diagnosed with likely lung cancer pending tissue biopsy. Positive COVID 19 on 07/2021. On his initial physical examination his blood pressure 139/92, heart rate 128, temperature 98.2, respiratory rate 25, oxygen saturation 88% on supplemental oxygen, he had decreased breath sounds bilaterally, heart S1-S2, present, tachycardic, irregular, abdomen soft, lower extremities with right BKA.   Sodium 135, potassium 4.1, chloride 89, bicarb 32, glucose 160, BUN 19, creatinine 0.98, magnesium 1.8, white count 4.8, hemoglobin 12.7, hematocrit 36.3, platelets 39. SARS COVID-19 negative.   Chest radiograph with hyperinflation, right lower lobe opacity, left base atelectasis.   EKG 123 bpm, normal axis, normal intervals, multifocal atrial tachycardia, no significant ST segment or T wave changes.    Patient placed on aggressive bronchodilator therapy and systemic steroids. Developed worsening thrombocytopenia.    Telemetry with multifocal atrial tachycardia and started on metoprolol   Patient very weak and deconditioned,continue to have high oxygen requirements.  Poor prognosis.   Assessment & Plan:   Principal Problem:   Acute on chronic respiratory failure with hypoxia (HCC) Active Problems:   Other chronic pain   Chronic obstructive pulmonary  disease (HCC)   Mediastinal lymphadenopathy   Lung mass   Adenopathy   Protein-calorie malnutrition, severe   DNR (do not resuscitate)   Palliative care by specialist   Anxiety   Lung cancer ([place])   Pancytopenia ([place])   Right lower lobe bronchogenic carcinoma, pending tissue diagnosis (C1 metastatic disease) / COPD exacerbation with acute on chronic respiratory failure/ post obstructive pneumonia.  Patient continue to have high oxygen requirements with 15 L per non re-breather mask.  RR in the 30's and HR 117 bpm this am, mild use of accessory muscles.   Plan to continue supportive care, with chest PT and with airway clearing techniques with incentive spirometer and flutter valve.    Systemic corticosteroids, aggressive bronchodilator therapy and inhaled corticosteroids. Continue with IV ceftriaxone and po azithromycin for suspected post -obstructive pneumonia.   Continue with oxycodone for pain control.  Dysphagia 2 with aspiration precautions.   Patient with poor prognosis, in case malignancy is confirmed, patient is very deconditioned and likely not candidate for aggressive cancer therapy.   Follow with palliative care recommendations, schedule for meeting today.   2. Multifocal atrial tachycardia.  Patient continue to be tachycardic, despite metoprolol. In the setting of respiratory failure with hypoxemia, tachyarrhythmia will be difficult to control.    Continue with metoprolol 50 mg po bid Add IV metoprolol in case HR 130 bpm or greater  3. Swallow dysfunction. Continue with dysphagia 3 diet. Continue to be very deconditioned high risk for aspiration.    4. Anxiety/ depression. On Alprazolam and mirtazapine.     5. Thrombocytopenia, anemia and leukopenia. Likely multifactorial in the setting of lung cancer.   Continue worsening thrombocytopenia, today down to 27, his wbc is 3,2 and hgb at 31.2 Will continue to hold platelet  transfusion for now unless patient  bleeding. If Plt less than 10, will consider plt transfusion.    6. Hypomagnesemia and hypokalemia/ chronic metabolic alkalosis.  Renal function stable with serum cr at 0,58, K is 4,4 and serum bicarbonate at 37.   7. Sever protein calorie malnutrition. Continue with nutritional supplements as tolerated   8. Anxiety. Continue as needed alprazolam, nortriptyline and mirtazapine.   Patient continue to be at high risk for worsening respiratory failure   Status is: Inpatient  Remains inpatient appropriate because:Inpatient level of care appropriate due to severity of illness  Dispo: The patient is from: Home              Anticipated d/c is to:  to be determined               Patient currently is not medically stable to d/c.   Difficult to place patient No  DVT prophylaxis: Scd   Code Status:    DNR   Family Communication:  No family at the bedside      Nutrition Status: Nutrition Problem: Severe Malnutrition Etiology: chronic illness (COPD, Stage IV Lung Cancer) Signs/Symptoms: severe muscle depletion, severe fat depletion, percent weight loss, energy intake < or equal to 75% for > or equal to 1 month Percent weight loss: 10.2 % Interventions: Hormel Shake, MVI     Consultants:  Palliative care Pulmonary    Antimicrobials:  Ceftriaxone and azithromycin     Subjective: Patient continue to be very weak and deconditioned, continue to have dyspnea and chest pain, poor oral intake.   Objective: Vitals:   08/26/21 0426 08/26/21 0700 08/26/21 0734 08/26/21 0735  BP: 119/63 116/63    Pulse: (!) 102 (!) 110 77 75  Resp: (!) 22 (!) 29 (!) 29 (!) 26  Temp: 97.7 F (36.5 C) 97.7 F (36.5 C)    TempSrc: Axillary Axillary    SpO2: 96% 94% 90% 91%  Weight:      Height:        Intake/Output Summary (Last 24 hours) at 08/26/2021 6644 Last data filed at 08/26/2021 0400 Gross per 24 hour  Intake 200 ml  Output --  Net 200 ml   Filed Weights   08/22/21 1143  Weight: 71.3  kg    Examination:   General: deconditioned and ill looking appearing  Neurology: somnolent but easy to arouse. Follows commands and answers to simple questions. E ENT: positive pallor, no icterus, oral mucosa dry, mild use of accessory muscles.  Cardiovascular: No JVD. S1-S2 present, rhythmic, no gallops, rubs, or murmurs. No lower extremity edema. Pulmonary: positive breath sounds bilaterally, decreased air movement, poor inspiratory effort, no wheezing, positive scattered rales. Gastrointestinal. Abdomen soft and non tender Skin. No rashes Musculoskeletal: no joint deformities     Data Reviewed: I have personally reviewed following labs and imaging studies  CBC: Recent Labs  Lab 08/22/21 1534 08/23/21 0234 08/24/21 0219 08/26/21 0319  WBC 4.8 4.1 3.0* 3.2*  NEUTROABS 1.8  --   --   --   HGB 12.7* 11.6* 11.1* 10.8*  HCT 36.3* 34.2* 32.3* 31.2*  MCV 85.8 87.5 86.4 86.4  PLT 39* 33* 27* 23*   Basic Metabolic Panel: Recent Labs  Lab 08/22/21 1534 08/23/21 0234 08/24/21 0219 08/26/21 0319  NA 135 136 137 135  K 4.1 3.7 4.0 4.4  CL 89* 91* 95* 90*  CO2 32 35* 33* 37*  GLUCOSE 160* 131* 183* 110*  BUN 19 22 21 15   CREATININE  0.98 0.87 0.66 0.58*  CALCIUM 9.8 9.4 9.0 9.3  MG 1.8 1.9  --   --    GFR: Estimated Creatinine Clearance: 90.4 mL/min (A) (by C-G formula based on SCr of 0.58 mg/dL (L)). Liver Function Tests: Recent Labs  Lab 08/22/21 1534  AST 98*  ALT 50*  ALKPHOS 181*  BILITOT 1.1  PROT 6.3*  ALBUMIN 2.3*   No results for input(s): LIPASE, AMYLASE in the last 168 hours. No results for input(s): AMMONIA in the last 168 hours. Coagulation Profile: No results for input(s): INR, PROTIME in the last 168 hours. Cardiac Enzymes: No results for input(s): CKTOTAL, CKMB, CKMBINDEX, TROPONINI in the last 168 hours. BNP (last 3 results) No results for input(s): PROBNP in the last 8760 hours. HbA1C: No results for input(s): HGBA1C in the last 72  hours. CBG: No results for input(s): GLUCAP in the last 168 hours. Lipid Profile: No results for input(s): CHOL, HDL, LDLCALC, TRIG, CHOLHDL, LDLDIRECT in the last 72 hours. Thyroid Function Tests: No results for input(s): TSH, T4TOTAL, FREET4, T3FREE, THYROIDAB in the last 72 hours. Anemia Panel: No results for input(s): VITAMINB12, FOLATE, FERRITIN, TIBC, IRON, RETICCTPCT in the last 72 hours.    Radiology Studies: I have reviewed all of the imaging during this hospital visit personally     Scheduled Meds:  azithromycin  500 mg Oral Daily   chlorpheniramine-HYDROcodone  5 mL Oral Q12H   docusate sodium  100 mg Oral BID   ipratropium-albuterol  3 mL Nebulization TID   megestrol  400 mg Oral BID   methylPREDNISolone (SOLU-MEDROL) injection  40 mg Intravenous Daily   metoprolol tartrate  50 mg Oral BID   mirtazapine  7.5 mg Oral QHS   mometasone-formoterol  2 puff Inhalation BID   multivitamin with minerals  1 tablet Oral Daily   nortriptyline  100 mg Oral QHS   pantoprazole  40 mg Oral Daily   polyethylene glycol  17 g Oral Daily   senna  1 tablet Oral BID   Continuous Infusions:  cefTRIAXone (ROCEPHIN)  IV 200 mL/hr at 08/26/21 0400     LOS: 4 days        Jalena Vanderlinden Gerome Apley, MD

## 2021-08-26 NOTE — TOC Progression Note (Signed)
Transition of Care [facility]) - Progression Note    Patient Details  Name: [name] MRN: [id number] Date of Birth: 1951/03/06  Transition of Care [facility] CM/SW Contact  Coralee P[place], [place] Phone Number: 08/26/2021, 11:18 AM  Clinical Narrative:    CSW was notified that family and pt are now requesting to transition to hospice Care. They stated a preference for [facility] ([facility] [place] CSW made referral to facility (fax: [phone]) and will follow for placement confirmation. TOC to continue to follow.   Expected Discharge Plan: [place] Barriers to Discharge: No Barriers Identified  Expected Discharge Plan and Services Expected Discharge Plan: [place] In-house Referral: NA Discharge Planning Services: CM Consult, NA Post Acute Care Choice: NA Living arrangements for the past 2 months: [place]                 DME Arranged: N/A DME Agency: NA       HH Arranged: NA HH Agency: NA         Social Determinants of Health (SDOH) Interventions    Readmission Risk Interventions Readmission Risk Prevention Plan 08/24/2021  Transportation Screening Complete  PCP or Specialist Appt within 5-7 Days Complete  Home Care Screening Complete  Medication Review (RN CM) Referral to Pharmacy  Some recent data might be hidden
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
## 2021-08-26 NOTE — Plan of Care (Signed)

## 2021-08-26 NOTE — Progress Notes (Signed)
HOSPITAL MEDICINE OVERNIGHT EVENT NOTE    Notified by nursing that patient has a platelet count of 23 this morning, continued slow downtrend compared to prior values.  Discussed with nursing, patient is not exhibiting any evidence of heavy bruising or bleeding.  Chart reviewed, patient currently being managed for COPD exacerbation complicated by likely right lower lobe bronchogenic carcinoma with overall prognosis is rather poor.  No need for platelet transfusion at this time.  Continue to monitor for bleeding complications and continue to perform serial CBCs.  Vernelle Emerald  MD [facility]

## 2021-08-27 DIAGNOSIS — J449 Chronic obstructive pulmonary disease, unspecified: Secondary | ICD-10-CM | POA: Diagnosis not present

## 2021-08-27 DIAGNOSIS — Z515 Encounter for palliative care: Secondary | ICD-10-CM | POA: Diagnosis not present

## 2021-08-27 DIAGNOSIS — G8929 Other chronic pain: Secondary | ICD-10-CM | POA: Diagnosis not present

## 2021-08-27 DIAGNOSIS — Z66 Do not resuscitate: Secondary | ICD-10-CM | POA: Diagnosis not present

## 2021-08-27 DIAGNOSIS — J9621 Acute and chronic respiratory failure with hypoxia: Secondary | ICD-10-CM | POA: Diagnosis not present

## 2021-08-27 DIAGNOSIS — Z7189 Other specified counseling: Secondary | ICD-10-CM | POA: Diagnosis not present

## 2021-08-27 DIAGNOSIS — D61818 Other pancytopenia: Secondary | ICD-10-CM

## 2021-08-27 MED ORDER — METOPROLOL TARTRATE 50 MG PO TABS
50.0000 mg | ORAL_TABLET | Freq: Two times a day (BID) | ORAL | Status: DC
  Administered 2021-08-27 – 2021-08-31 (×8): 50 mg via ORAL
  Filled 2021-08-27 (×8): qty 1

## 2021-08-27 NOTE — Plan of Care (Signed)

## 2021-08-27 NOTE — Progress Notes (Signed)
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  Transitioned to comfort care pending transfer to residential hospice.    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  He has anorexia, weight loss and hemoptysis, diagnosed with likely lung cancer pending tissue biopsy. Positive COVID 19 on 07/2021. On his initial physical examination his blood pressure 139/92, heart rate 128, temperature 98.2, respiratory rate 25, oxygen saturation 88% on supplemental oxygen, he had decreased breath sounds bilaterally, heart S1-S2, present, tachycardic, irregular, abdomen soft, lower extremities with right BKA.   Sodium 135, potassium 4.1, chloride 89, bicarb 32, glucose 160, BUN 19, creatinine 0.98, magnesium 1.8, white count 4.8, hemoglobin 12.7, hematocrit 36.3, platelets 39. SARS COVID-19 negative.   Chest radiograph with hyperinflation, right lower lobe opacity, left base atelectasis.   EKG 123 bpm, normal axis, normal intervals, multifocal atrial tachycardia, no significant ST segment or T wave changes.    Patient placed on aggressive bronchodilator therapy and systemic steroids. Developed worsening thrombocytopenia.    Telemetry with multifocal atrial tachycardia and started on metoprolol   Patient very weak and deconditioned,continue to have high oxygen requirements.  Poor prognosis.  Palliative care was consulted and patient has been transition to comfort care, pending transfer to  hospice.    Assessment & Plan:   Principal Problem:   Acute on chronic respiratory failure with hypoxia (HCC) Active Problems:   Other chronic pain   Chronic obstructive pulmonary disease (HCC)   Mediastinal lymphadenopathy   Lung mass   Adenopathy   Protein-calorie malnutrition, severe   DNR (do not resuscitate)   Palliative care by specialist   Anxiety   [place] ([place]   Pancytopenia ([place])   Right lower lobe bronchogenic carcinoma, pending tissue diagnosis (C1 metastatic disease) / COPD exacerbation with acute on chronic respiratory failure/ post obstructive pneumonia.  Patient and his family have decided not to proceed with further work up and transition to comfort care. This am patient has pain in his lower extremities, dyspnea seems to be stable. Continue to use non rebreather mask 15 L.min with oxygen saturation at 93 to 96%.    Supportive care, with chest PT and with airway clearing techniques with incentive spirometer and flutter valve.    Continue with systemic corticosteroids,  bronchodilator therapy and inhaled corticosteroids.  Dysphagia 2 with aspiration precautions.  Pain control with morphine, added lorazepam as needed for anxiety.    2. Multifocal atrial tachycardia.  Resume metoprolol 50 mg po bid for rate control. Ok to discontinue telemetry monitoring.   3. Swallow dysfunction. Dysphagia 3 diet with aspiration precautions.   4. Anxiety/ depression. On mirtazapine., added lorazepam, dc alprazolam. Started with nortriptyline.   5. Thrombocytopenia, anemia and leukopenia.  Poor prognosis, not candidate for blood products transfusion No further blood work.   6. Hypomagnesemia and hypokalemia/ chronic metabolic alkalosis.  Continue to encourage po intake. Hold on further blood work.    7. Sever protein calorie malnutrition. Continue to encourage po intake.    8. Anxiety. Added lorazepam, continue with nortriptyline and mirtazapine  Patient continue  to be at high risk for worsening respiratory failure  Status is: Inpatient  Remains inpatient appropriate because:Inpatient level of care appropriate due to severity of illness  Dispo: The patient is from: SNF              Anticipated d/c is to:  residential hospice              Patient currently is medically stable to d/c.   Difficult to place patient No  DVT prophylaxis: Scd   Code Status:    DNR   Family Communication:  I spoke with patient's sisters at the bedside, we talked in detail about patient's condition, plan of care and prognosis and all questions were addressed.      Nutrition Status: Nutrition Problem: Severe Malnutrition Etiology: chronic illness (COPD, Stage IV Lung Cancer) Signs/Symptoms: severe muscle depletion, severe fat depletion, percent weight loss, energy intake < or equal to 75% for > or equal to 1 month Percent weight loss: 10.2 % Interventions: Hormel Shake, MVI      Consultants:  Pulmonary Palliative care  Subjective: Patient with no nausea or vomiting, continue to have dyspnea and today with bilateral thigh pain.  Continue to be very weak and deconditioned, poor oral intake.   Objective: Vitals:   08/27/21 0000 08/27/21 0405 08/27/21 0747 08/27/21 0750  BP: (!) 104/58 127/73    Pulse: (!) 109     Resp: (!) 26 (!) 22    Temp:  97.6 F (36.4 C)    TempSrc:  Axillary    SpO2: 99% 98% 93% 96%  Weight:      Height:        Intake/Output Summary (Last 24 hours) at 08/27/2021 1057 Last data filed at 08/27/2021 0000 Gross per 24 hour  Intake --  Output 700 ml  Net -700 ml   Filed Weights   08/22/21 1143  Weight: 71.3 kg    Examination:   General: deconditioned and ill looking appearing  Neurology: Awake and alert, non focal  E ENT: positive pallor, no icterus, oral mucosa dry Cardiovascular: No JVD. S1-S2 present, rhythmic, no gallops, rubs, or murmurs. No lower extremity edema. Pulmonary: positive breath sounds bilaterally,  bilateral rhonchi Gastrointestinal. Abdomen soft and non tender Skin. No rashes Musculoskeletal: no joint deformities     Data Reviewed: I have personally reviewed following labs and imaging studies  CBC: Recent Labs  Lab 08/22/21 1534 08/23/21 0234 08/24/21 0219 08/26/21 0319  WBC 4.8 4.1 3.0* 3.2*  NEUTROABS 1.8  --   --   --   HGB 12.7* 11.6* 11.1* 10.8*  HCT 36.3* 34.2* 32.3* 31.2*  MCV 85.8 87.5 86.4 86.4  PLT 39* 33* 27* 23*   Basic Metabolic Panel: Recent Labs  Lab 08/22/21 1534 08/23/21 0234 08/24/21 0219 08/26/21 0319  NA 135 136 137 135  K 4.1 3.7 4.0 4.4  CL 89* 91* 95* 90*  CO2 32 35* 33* 37*  GLUCOSE 160* 131* 183* 110*  BUN 19 22 21 15   CREATININE 0.98 0.87 0.66 0.58*  CALCIUM 9.8 9.4 9.0 9.3  MG 1.8 1.9  --   --    GFR: Estimated Creatinine Clearance: 90.4 mL/min (A) (by C-G formula based on SCr of 0.58 mg/dL (L)). Liver Function Tests: Recent Labs  Lab 08/22/21 1534  AST 98*  ALT 50*  ALKPHOS 181*  BILITOT 1.1  PROT 6.3*  ALBUMIN 2.3*   No results for input(s): LIPASE, AMYLASE in the last 168 hours.  No results for input(s): AMMONIA in the last 168 hours. Coagulation Profile: No results for input(s): INR, PROTIME in the last 168 hours. Cardiac Enzymes: No results for input(s): CKTOTAL, CKMB, CKMBINDEX, TROPONINI in the last 168 hours. BNP (last 3 results) No results for input(s): PROBNP in the last 8760 hours. HbA1C: No results for input(s): HGBA1C in the last 72 hours. CBG: No results for input(s): GLUCAP in the last 168 hours. Lipid Profile: No results for input(s): CHOL, HDL, LDLCALC, TRIG, CHOLHDL, LDLDIRECT in the last 72 hours. Thyroid Function Tests: No results for input(s): TSH, T4TOTAL, FREET4, T3FREE, THYROIDAB in the last 72 hours. Anemia Panel: No results for input(s): VITAMINB12, FOLATE, FERRITIN, TIBC, IRON, RETICCTPCT in the last 72 hours.    Radiology Studies: I have reviewed all of the imaging during this  hospital visit personally     Scheduled Meds:  ipratropium-albuterol  3 mL Nebulization TID   methylPREDNISolone (SOLU-MEDROL) injection  40 mg Intravenous Daily   mirtazapine  7.5 mg Oral QHS   mometasone-formoterol  2 puff Inhalation BID   nortriptyline  100 mg Oral QHS   Continuous Infusions:   LOS: 5 days        Bernis Stecher Gerome Apley, MD

## 2021-08-27 NOTE — TOC Progression Note (Signed)
Transition of Care [facility]) - Progression Note    Patient Details  Name: [name] MRN: [id number] Date of Birth: 08/17/1951  Transition of Care [facility]) CM/SW [place], [place] Phone Number: 08/27/2021, 10:45 AM  Clinical Narrative:    CSW followed up with [facility]. They confirmed they have received the referral. They stated there are several on the waiting list before him, and they are unsure of when a bed may come available. CSW updated Palliative who will speak with family later. TOC will continue to follow for bed placement.   Expected Discharge Plan: [place] Barriers to Discharge: No Barriers Identified  Expected Discharge Plan and Services Expected Discharge Plan: [place] In-house Referral: NA Discharge Planning Services: CM Consult, NA Post Acute Care Choice: NA Living arrangements for the past 2 months: [place]                 DME Arranged: N/A DME Agency: NA       HH Arranged: NA HH Agency: NA         Social Determinants of Health (SDOH) Interventions    Readmission Risk Interventions Readmission Risk Prevention Plan 08/24/2021  Transportation Screening Complete  PCP or Specialist Appt within 5-7 Days Complete  Home Care Screening Complete  Medication Review (RN CM) Referral to Pharmacy  Some recent data might be hidden
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
## 2021-08-27 NOTE — Plan of Care (Signed)
  Problem: Education: Goal: Knowledge of General Education information will improve Description Including pain rating scale, medication(s)/side effects and non-pharmacologic comfort measures Outcome: Progressing   

## 2021-08-28 ENCOUNTER — Encounter (HOSPITAL_COMMUNITY)
Admission: RE | Disposition: A | Discharge status: Hospice | Payer: Self-pay | Source: Home / Self Care | Attending: Internal Medicine

## 2021-08-28 DIAGNOSIS — R0602 Shortness of breath: Secondary | ICD-10-CM

## 2021-08-28 DIAGNOSIS — J9621 Acute and chronic respiratory failure with hypoxia: Secondary | ICD-10-CM | POA: Diagnosis not present

## 2021-08-28 DIAGNOSIS — J449 Chronic obstructive pulmonary disease, unspecified: Secondary | ICD-10-CM | POA: Diagnosis not present

## 2021-08-28 DIAGNOSIS — R918 Other nonspecific abnormal finding of lung field: Secondary | ICD-10-CM | POA: Diagnosis not present

## 2021-08-28 DIAGNOSIS — E43 Unspecified severe protein-calorie malnutrition: Secondary | ICD-10-CM

## 2021-08-28 DIAGNOSIS — Z7189 Other specified counseling: Secondary | ICD-10-CM | POA: Diagnosis not present

## 2021-08-28 DIAGNOSIS — R599 Enlarged lymph nodes, unspecified: Secondary | ICD-10-CM

## 2021-08-28 DIAGNOSIS — Z66 Do not resuscitate: Secondary | ICD-10-CM | POA: Diagnosis not present

## 2021-08-28 DIAGNOSIS — F419 Anxiety disorder, unspecified: Secondary | ICD-10-CM

## 2021-08-28 SURGERY — CANCELLED PROCEDURE

## 2021-08-28 NOTE — Plan of Care (Signed)
  Problem: Education: Goal: Knowledge of General Education information will improve Description: Including pain rating scale, medication(s)/side effects and non-pharmacologic comfort measures Outcome: Progressing   Problem: Coping: Goal: Level of anxiety will decrease Outcome: Progressing   Problem: Pain Managment: Goal: General experience of comfort will improve Outcome: Progressing   
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
## 2021-08-28 NOTE — Progress Notes (Signed)
Nutrition Brief Note  Chart reviewed. Pt now transitioning to comfort care.  No further nutrition interventions planned at this time.  Please re-consult as needed.   Larkin Ina, MS, RD, LDN (she/her/hers) RD pager number and weekend/on-call pager number located in [place].

## 2021-08-28 NOTE — Care Management Important Message (Signed)
Important Message  Patient Details  Name: [name] MRN: [id number] Date of Birth: 01/10/1951   Medicare Important Message Given:  Yes     [name] 08/28/2021, 4:27 PM
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
## 2021-08-28 NOTE — TOC Progression Note (Signed)
Transition of Care [facility]) - Progression Note    Patient Details  Name: [name] MRN: [id number] Date of Birth: [birth year]  Transition of Care [facility]) CM/SW [place], RN Phone [phone]  08/28/2021, 12:24 PM  Clinical Narrative:    CM called [facility] [phone]) to follow up on referral . CM spoke with Katharine Look who verifies that they have received the referral. Per Katharine Look patient has not been approved and the facility does not have any beds available. CM will continue to follow for placement.   Expected Discharge Plan: [place] Barriers to Discharge: No Barriers Identified  Expected Discharge Plan and Services Expected Discharge Plan: [place] In-house Referral: NA Discharge Planning Services: CM Consult, NA Post Acute Care Choice: NA Living arrangements for the past 2 months: [place]                 DME Arranged: N/A DME Agency: NA       HH Arranged: NA HH Agency: NA         Social Determinants of Health (SDOH) Interventions    Readmission Risk Interventions Readmission Risk Prevention Plan 08/24/2021  Transportation Screening Complete  PCP or Specialist Appt within 5-7 Days Complete  Home Care Screening Complete  Medication Review (RN CM) Referral to Pharmacy  Some recent data might be hidden

## 2021-08-28 NOTE — Progress Notes (Addendum)
   NAME:  [name], MRN:  [id number], DOB:  08-Feb-1951, LOS: 6 ADMISSION DATE:  08/22/2021, CONSULTATION DATE:  08/22/21 REFERRING MD:  Valeta Harms, CHIEF COMPLAINT:  SOB   History of Present Illness:  70 year old man presenting from SNF with acute on chronic failure to thrive, anorexia, weight loss, hemoptysis found to have acute on chronic hypoxemic respiratory failure.  He was scheduled for bronchoscopy 9/20 but was too unstable with high HR and worsening O2 need.  PCCM to admit with TRH taking over starting 9/21.    Pertinent  Medical History  COPD GERD Anxiety Stage IV lung cancer awaiting tissue dx Hx of R BKA  Significant Hospital Events: Including procedures, antibiotic start and stop dates in addition to other pertinent events   9/20 arrived to pre-op ill appearing  Interim History / Subjective:  Thinks steroids have helped his dyspnea somewhat. Cough isn't especially bothersome.  Awaiting placement at [facility].  Objective   Blood pressure 118/63, pulse (!) 108, temperature 98.4 F (36.9 C), temperature source Oral, resp. rate (!) 21, height 6' (1.829 m), weight 71.3 kg, SpO2 (!) 87 %.        Intake/Output Summary (Last 24 hours) at 08/28/2021 1200 Last data filed at 08/27/2021 2000 Gross per 24 hour  Intake 280 ml  Output 1000 ml  Net -720 ml   Filed Weights   08/22/21 1143  Weight: 71.3 kg   Physical Exam: General appearance: 70 y.o., male, chronically ill appearing in mild distress Eyes: anicteric sclerae, tracking HENT: NCAT; dry MM Neck: Trachea midline;  no JVD Lungs: diminished bl,  with mildly increased respiratory effort CV: tachycardic, RR, no MRGs  Abdomen: Soft, non-tender; non-distended, BS present  Extremities: No peripheral edema, warm Skin: scattered ecchymoses Psych: Appropriate affect Neuro: Alert and oriented to person and place, moving all extremities    No new imaging  9/24 persistent TCP, leukopenia 9/24 [facility] Problem list   N/a  Assessment & Plan:   Acute hypoxemic respiratory failure secondary to RLL pneumonia Possible postobstructive pneumonia COPD exacerbation Continue supplemental oxygen titrated to comfort Can continue prednisone 40 mg at discharge if it's improving his dyspnea - he seems to indicate that it helps a little Can continue dulera BID, prn bronchodilators if they improves his dyspnea  Mediastinal adenopathy  Cancelled EBUS scheduled for today  Thrombocytopenia No evidence of bleeding  Goals of care Palliative consulted for FTT. Decision made to pursue comfort measures only, awaiting placement at [facility].  We will sign off but glad to be reinvolved as condition changes   [place]   If no response to pager , please call [phone] until 7 pm After 7:00 pm call Elink  [phone]    08/28/2021 12:00 PM

## 2021-08-29 DIAGNOSIS — R918 Other nonspecific abnormal finding of lung field: Secondary | ICD-10-CM | POA: Diagnosis not present

## 2021-08-29 DIAGNOSIS — C3431 Malignant neoplasm of lower lobe, right bronchus or lung: Secondary | ICD-10-CM | POA: Diagnosis not present

## 2021-08-29 DIAGNOSIS — J449 Chronic obstructive pulmonary disease, unspecified: Secondary | ICD-10-CM | POA: Diagnosis not present

## 2021-08-29 DIAGNOSIS — G8929 Other chronic pain: Secondary | ICD-10-CM

## 2021-08-29 DIAGNOSIS — Z7189 Other specified counseling: Secondary | ICD-10-CM | POA: Diagnosis not present

## 2021-08-29 DIAGNOSIS — Z66 Do not resuscitate: Secondary | ICD-10-CM | POA: Diagnosis not present

## 2021-08-29 DIAGNOSIS — Z515 Encounter for palliative care: Secondary | ICD-10-CM | POA: Diagnosis not present

## 2021-08-29 DIAGNOSIS — J9621 Acute and chronic respiratory failure with hypoxia: Secondary | ICD-10-CM | POA: Diagnosis not present

## 2021-08-29 MED ORDER — METOPROLOL TARTRATE 50 MG PO TABS: 50.0000 mg | ORAL_TABLET | Freq: Two times a day (BID) | ORAL | 0 refills | Status: AC

## 2021-08-29 MED ORDER — IPRATROPIUM-ALBUTEROL 0.5-2.5 (3) MG/3ML IN SOLN: 3.0000 mL | Freq: Three times a day (TID) | RESPIRATORY_TRACT | 0 refills | Status: AC

## 2021-08-29 MED ORDER — MOMETASONE FURO-FORMOTEROL FUM 200-5 MCG/ACT IN AERO: 2.0000 | INHALATION_SPRAY | Freq: Two times a day (BID) | RESPIRATORY_TRACT | 0 refills | Status: AC

## 2021-08-29 NOTE — TOC Progression Note (Addendum)
Transition of Care [facility]) - Progression Note    Patient Details  Name: FREDERICK MARRO MRN: [id number] Date of Birth: 09/21/1951  Transition of Care [facility]) CM/SW [place], RN Phone Number:[phone]  08/29/2021, 1:28 PM  Clinical Narrative:    CM called [facility] to verify if patient is able to return with Hospice and on 10L of O2. Voicemail has been left for [employer] in admissions. Will await return call.   1540 CM received return call from [place] at [facility]. Facility can manage patient on 10L O2. However patient will have to be private pay ($215/day) if he wishes to return to to [facility] under Hospice care. CM to discuss this with patient and family. TOC will continue to follow.    Expected Discharge Plan: [place] Barriers to Discharge: No Barriers Identified  Expected Discharge Plan and Services Expected Discharge Plan: [place] In-house Referral: NA Discharge Planning Services: CM Consult, NA Post Acute Care Choice: NA Living arrangements for the past 2 months: [place]                 DME Arranged: N/A DME Agency: NA       HH Arranged: NA HH Agency: NA         Social Determinants of Health (SDOH) Interventions    Readmission Risk Interventions Readmission Risk Prevention Plan 08/24/2021  Transportation Screening Complete  PCP or Specialist Appt within 5-7 Days Complete  Home Care Screening Complete  Medication Review (RN CM) Referral to Pharmacy  Some recent data might be hidden
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
## 2021-08-29 NOTE — Consult Note (Signed)
   [facility] Inpatient Consult   08/29/2021  [name] 1951/08/22 [id number]  [place] [facility] [ACO] Patient: Medicare CMS DCE  Primary Care Provider:  [name], MD, [place], an Embedded practice with a Chronic Care Management program and team   LLOS: 7 days   Patient screened for less than 7 days readmission hospitalization for potential Embedded Chronic Care Management service needs for post hospital transition.  Review of patient's medical record reveals patient is for transition to a hospice facility.   Plan:  Continue to follow progress and disposition to assess for post hospital care management needs.  If patient transitions to a hospice facility or hospice care his needs are to be met at that level of care for transition.  For questions contact:   Natividad Brood, RN BSN [facility] Liaison  [phone] business mobile phone Toll free office [phone]  Fax number: [phone] [email or web address]

## 2021-08-29 NOTE — Care Management Important Message (Signed)
Important Message  Patient Details  Name: [name] MRN: [id number] Date of Birth: Nov 13, 1951   Medicare Important Message Given:  Yes     Jameca Chumley [address]ine Circle 08/29/2021, 9:07 AM

## 2021-08-29 NOTE — Discharge Summary (Signed)
Physician Discharge Summary  [name] CXK:[id number] DOB: 1951/09/12 DOA: 08/22/2021  PCP: [name], MD  Admit date: 08/22/2021 Discharge date: 08/29/2021  Admitted From: SNF  Disposition: residential hospice   Recommendations for Outpatient Follow-up and new medication changes:  Follow up with Dr. Nicki Reaper as scheduled.  Follow up with hospice team.   Home Health: na   Equipment/Devices: na    Discharge Condition: stable  CODE STATUS: DNR   Diet recommendation: regular.   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  Transitioned to comfort care pending transfer to residential hospice.    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  He has anorexia, weight loss and hemoptysis, diagnosed with likely lung cancer pending tissue biopsy. Positive COVID 19 on 07/2021. On his initial physical examination his blood pressure 139/92, heart rate 128, temperature 98.2, respiratory rate 25, oxygen saturation 88% on supplemental oxygen, he had decreased breath sounds bilaterally, heart S1-S2, present, tachycardic, irregular, abdomen soft, lower extremities with right BKA.   Sodium 135, potassium 4.1, chloride 89, bicarb 32, glucose 160, BUN 19, creatinine 0.98, magnesium 1.8, white count 4.8, hemoglobin 12.7, hematocrit 36.3, platelets 39. SARS COVID-19 negative.   Chest radiograph with hyperinflation, right lower lobe opacity, left base atelectasis.   EKG 123 bpm, normal axis, normal intervals, multifocal atrial tachycardia, no significant ST segment or T wave changes.     Patient placed on aggressive bronchodilator therapy and systemic steroids. Developed worsening thrombocytopenia.    Telemetry with multifocal atrial tachycardia and started on metoprolol   Patient very weak and deconditioned,continue to have high oxygen requirements.  Poor prognosis.  Palliative care was consulted and patient was transition to comfort care, now pending transfer to residential hospice.   Right lower lobe bronchogenic carcinoma, pending tissue diagnosis (C1 metastatic disease), COPD exacerbation with acute on chronic hypoxic respiratory failure.  Postobstructive pneumonia. Patient received aggressive medical therapy including systemic corticosteroids, bronchodilators, inhaled steroids and airway clearing techniques. Speech evaluation recommended dysphagia diet with aspiration precautions. Unfortunately he continued to have persistent dyspnea and high oxygen requirements.  Using high flow nasal cannula 11 to 10 L/min to keep oxygen saturation greater than 90%.  Because of persistent symptoms and poor prognosis palliative care was consulted, patient decided to change care to comfort measures. Plan to transfer to residential hospice when bed available.  No diagnostic bronchoscopy was performed.  2.  Multifocal atrial tachycardia.  Patient has been placed on metoprolol 50 g twice daily with good toleration. His heart rate has remained in the low 100s.  3.  Swallow dysfunction.  Dysphagia diet with aspiration precautions. At discharge on regular diet for comfort care.   4.  Anxiety and depression.  On mirtazapine, nortriptyline and lorazepam.  5.  Thrombocytopenia, anemia and leukopenia.  Likely related to malignancy, he did not received blood products transfusion. Positive ecchymosis but no active bleeding.  6.  Hypomagnesemia, hypokalemia, chronic metabolic alkalosis.  His electrolytes were corrected, no further blood work was performed considering comfort care.  7.   Severe protein calorie malnutrition.  Patient received nutritional supplements.  Discharge Diagnoses:  Principal Problem:   Acute on chronic respiratory failure with hypoxia ([place])  Active Problems:   Other chronic pain   Chronic obstructive pulmonary disease (HCC)   Mediastinal lymphadenopathy   Lung mass   Adenopathy   Protein-calorie malnutrition, severe   DNR (do not resuscitate)   Palliative care by specialist   Anxiety   Lung cancer ([place])   Pancytopenia ([place])    Discharge Instructions   Allergies as of 08/29/2021       Reactions   Asa [aspirin] Other (See Comments)   Intolerance to aspirin due to ulcer   Benadryl [diphenhydramine Hcl] Other (See Comments)   Nervousness, insomnia        Medication List     STOP taking these medications    ALPRAZolam 0.5 MG tablet Commonly known as: XANAX       TAKE these medications    acetaminophen 500 MG tablet Commonly known as: TYLENOL Take 500 mg by mouth every 6 (six) hours as needed for mild pain, fever or headache.   feeding supplement Liqd Take 237 mLs by mouth 3 (three) times daily between meals.   hyoscyamine 0.125 MG SL tablet Commonly known as: LEVSIN SL PLACE 1 TABLET UNDER THE TONGUE EVERY 6 HOURS AS NEEDED FOR CRAMPS   ipratropium-albuterol 0.5-2.5 (3) MG/3ML Soln Commonly known as: DUONEB Take 3 mLs by nebulization 3 (three) times daily.   megestrol 400 MG/10ML suspension Commonly known as: MEGACE Take 10 mLs (400 mg total) by mouth 2 (two) times daily.   metoprolol tartrate 50 MG tablet Commonly known as: LOPRESSOR Take 1 tablet (50 mg total) by mouth 2 (two) times daily.   mirtazapine 15 MG disintegrating tablet Commonly known as: REMERON SOL-TAB Take 0.5 tablets (7.5 mg total) by mouth at bedtime.   mometasone-formoterol 200-5 MCG/ACT Aero Commonly known as: DULERA Inhale 2 puffs into the lungs 2 (two) times daily.   multivitamin with minerals Tabs tablet Take 1 tablet by mouth  daily.   nortriptyline 50 MG capsule Commonly known as: PAMELOR Take 2 capsules (100 mg total) by mouth at bedtime.   omeprazole 40 MG capsule Commonly known as: PRILOSEC Take 1 capsule (40 mg total) by mouth daily.   polyethylene glycol 17 g packet Commonly known as: MIRALAX / GLYCOLAX Take 17 g by mouth daily.   predniSONE 5 MG tablet Commonly known as: DELTASONE Take 15 mg by mouth daily with breakfast.   senna 8.6 MG Tabs tablet Commonly known as: SENOKOT Take 1 tablet (8.6 mg total) by mouth daily as needed for mild constipation.   Tussin DM 10-100 MG/5ML liquid Generic drug: dextromethorphan-guaiFENesin Take 5 mLs by mouth every 4 (four) hours as needed for cough.        Allergies  Allergen Reactions   Asa [Aspirin] Other (See Comments)    Intolerance to aspirin due to ulcer   Benadryl [Diphenhydramine Hcl] Other (See Comments)    Nervousness, insomnia    Consultations: Pulmonary  Palliative Care.    Procedures/Studies: DG Chest 2 View  Result Date: 08/09/2021 CLINICAL DATA:  70 year old male with history of shortness of breath. History of non-small cell lung cancer. EXAM: CHEST - 2 VIEW COMPARISON:  Chest x-ray 07/04/2021. FINDINGS: Ill-defined opacity in the right lower lobe compatible with residual airspace consolidation. Left lung is clear. No pneumothorax. No evidence of pulmonary edema. Extensive emphysematous changes are noted. Masslike fullness in the right hilar region and extensive thickening of the right paratracheal soft tissues again noted. IMPRESSION: 1. Right hilar and right paratracheal lymphadenopathy with some residual postobstructive pneumonia in the right  lower lobe, similar to prior examinations. 2. Emphysema. Electronically Signed   By: Vinnie Langton M.D.   On: 08/09/2021 21:40   CT Soft Tissue Neck W Contrast  Result Date: 08/15/2021 CLINICAL DATA:  Soft tissue mass of the neck EXAM: CT NECK WITH CONTRAST TECHNIQUE: Multidetector CT  imaging of the neck was performed using the standard protocol following the bolus administration of intravenous contrast. CONTRAST:  162mL OMNIPAQUE IOHEXOL 350 MG/ML SOLN COMPARISON:  None. FINDINGS: PHARYNX AND LARYNX: The nasopharynx, oropharynx and larynx are normal. Visible portions of the oral cavity, tongue base and floor of mouth are normal. Normal epiglottis, vallecula and pyriform sinuses. The larynx is normal. No retropharyngeal abscess, effusion or lymphadenopathy. SALIVARY GLANDS: Normal parotid, submandibular and sublingual glands. THYROID: Normal. LYMPH NODES: Hyperenhancing mass adjacent to the left transverse process of C1 measuring 3.0 x 1.5 cm. There is expansion of the adjacent left obliquus capitis inferior muscle (3:31). The mass likely extends into the left C1 transverse foramen. VASCULAR: Major cervical vessels are patent. LIMITED INTRACRANIAL: Normal. VISUALIZED ORBITS: Normal. MASTOIDS AND VISUALIZED PARANASAL SINUSES: No fluid levels or advanced mucosal thickening. No mastoid effusion. SKELETON: No bony spinal canal stenosis. No lytic or blastic lesions. UPPER CHEST: Partially necrotic mass of the upper mediastinum measures approximately 3.4 x 3.5 cm and is better characterized on concomitant CTA chest. There is emphysema. OTHER: None. IMPRESSION: 1. Hyperenhancing mass adjacent to the left transverse process of C1 with expansion of the adjacent left obliquus capitis inferior muscle, most consistent with metastatic disease. The mass likely extends into the left C1 transverse foramen but the left vertebral artery remains patent. 2. Partially necrotic mass of the upper mediastinum, better characterized on concomitant CTA chest. Emphysema (ICD10-J43.9). Electronically Signed   By: Ulyses Jarred M.D.   On: 08/15/2021 00:46   CT Angio Chest PE W and/or Wo Contrast  Result Date: 08/15/2021 CLINICAL DATA:  Tachycardia, weakness, dehydration.  Lung cancer. EXAM: CT ANGIOGRAPHY CHEST WITH  CONTRAST TECHNIQUE: Multidetector CT imaging of the chest was performed using the standard protocol during bolus administration of intravenous contrast. Multiplanar CT image reconstructions and MIPs were obtained to evaluate the vascular anatomy. CONTRAST:  127mL OMNIPAQUE IOHEXOL 350 MG/ML SOLN COMPARISON:  PET-CT dated 08/03/2021 FINDINGS: Cardiovascular: Satisfactory opacification the bilateral pulmonary arteries to the segmental level. No evidence of pulmonary embolism. Although not tailored for evaluation of the thoracic aorta, there is no evidence of thoracic aortic aneurysm or dissection. Heart is normal in size.  No pericardial effusion. Coronary atherosclerosis of the LAD and right coronary artery. Mediastinum/Nodes: Bulky mediastinal lymphadenopathy, unchanged from recent PET. Dominant right paratracheal node measures 3.1 cm short axis (series 5/image 28). Visualized thyroid is unremarkable. Lungs/Pleura: Bulky right infrahilar/right lower lobe mass narrowing the right lower lobe bronchus, measuring 4.1 x 6.4 cm (series 5/image 46), unchanged from recent PET. Mild postobstructive opacity in the right lower lobe (series 7/image 102). Moderate centrilobular and paraseptal emphysematous changes, upper lung predominant. No pleural effusion or pneumothorax. Upper Abdomen: Visualized upper abdomen is notable for a small hiatal hernia. Musculoskeletal: Multifocal lytic metastases, including the right sternum (series 5/image 12) and the left T7 transverse process (series 5/image 40). Additional vertebral body lesions are better evaluated on recent PET. Review of the MIP images confirms the above findings. IMPRESSION: No evidence of pulmonary embolism. Bulky right infrahilar/right lower lobe mass, corresponding to the patient's known primary bronchogenic neoplasm, unchanged from recent PET. Bulky mediastinal lymphadenopathy. Multifocal osseous metastases, better evaluated on recent PET. Aortic  Atherosclerosis  (ICD10-I70.0) and Emphysema (ICD10-J43.9). Electronically Signed   By: Julian Hy M.D.   On: 08/15/2021 00:36   MR Brain W Wo Contrast  Result Date: 08/05/2021 CLINICAL DATA:  Malignant neoplasm of unspecified part of unspecified bronchus or lung. Non-small-cell lung cancer, staging. EXAM: MRI HEAD WITHOUT AND WITH CONTRAST TECHNIQUE: Multiplanar, multiecho pulse sequences of the brain and surrounding structures were obtained without and with intravenous contrast. CONTRAST:  7.80mL GADAVIST GADOBUTROL 1 MMOL/ML IV SOLN COMPARISON:  PET-CT 08/03/2021. brain MRI 04/30/2015. FINDINGS: Brain: Mild intermittent motion degradation. Mild generalized cerebral and cerebellar atrophy. Mild multifocal T2/FLAIR hyperintensity within the cerebral white matter, nonspecific but compatible with chronic small vessel ischemic disease. There is no acute infarct. No evidence of an intracranial mass. No chronic intracranial blood products. No extra-axial fluid collection. No midline shift. No pathologic intracranial enhancement to suggest intracranial metastatic disease. Vascular: Maintained flow voids within the proximal large arterial vessels. Skull and upper cervical spine: Osseous and/or soft tissue mass in the region of the left C1 lateral mass (series 7, image 16). Hypermetabolism was present at this site on the prior PET-CT of 08/03/2021. Ill-defined focus of enhancement within the left parietal calvarium, which could reflect a focus of osseous metastatic disease (for instance as seen on series 18, image 127). Sinuses/Orbits: Visualized orbits show no acute finding. Frothy secretions and mild mucosal thickening within the left sphenoid sinus. Mild mucosal thickening within the right ethmoid and maxillary sinuses. IMPRESSION: Motion degraded exam. No evidence of intracranial metastatic disease. Osseous and/or soft tissue mass in the region of the left C1 lateral mass. Hypermetabolism was present at this site on the prior  PET-CT of 08/03/2021. A contrast-enhanced cervical spine MRI should be considered for further evaluation. Ill-defined focus of enhancement within the left parietal calvarium, nonspecific but possibly reflecting a site of osseous metastatic disease. Mild chronic small-vessel ischemic changes within the cerebral white matter. Mild generalized parenchymal atrophy. Paranasal sinus disease, as described. Correlate for acute sinusitis Electronically Signed   By: Kellie Simmering D.O.   On: 08/05/2021 14:37   NM PET Image Initial (PI) Skull Base To Thigh (F-18 FDG)  Result Date: 08/05/2021 CLINICAL DATA:  Initial treatment strategy for non-small cell lung cancer. EXAM: NUCLEAR MEDICINE PET SKULL BASE TO THIGH TECHNIQUE: 8.58 mCi F-18 FDG was injected intravenously. Full-ring PET imaging was performed from the skull base to thigh after the radiotracer. CT data was obtained and used for attenuation correction and anatomic localization. Fasting blood glucose: 151 mg/dl COMPARISON:  CT scan 07/21/2021 FINDINGS: Mediastinal blood pool activity: SUV max 2.14 Liver activity: SUV max NA NECK: No hypermetabolic lymph nodes in the neck. Incidental CT findings: none CHEST: The right hilar/central lung mass is hypermetabolic with SUV max of 6.20. Associated right hilar, right paratracheal and subcarinal lymphadenopathy. The partially necrotic right paratracheal node has an SUV max of 7.99. No supraclavicular or axillary adenopathy. Mild hypermetabolism in the atelectatic right lower lobe is likely postobstructive pneumonitis. No findings metastatic pulmonary nodules. Incidental CT findings: Stable underlying emphysematous changes and pulmonary scarring. Stable scattered atherosclerotic calcifications involving the aorta and coronary arteries. ABDOMEN/PELVIS: No findings to suggest abdominal/pelvic metastatic disease. No hepatic adrenal gland lesions or. Incidental CT findings: Numerous small layering gallstones are noted in the  gallbladder. Moderate scattered arterial calcifications. SKELETON: Diffuse osseous metastatic disease. There is a lytic lesion involving the right proximal humeral shaft which has an SUV max 8.35. Right sternal manubrial lesion is destroying the posterior cortex. SUV max is  7.76. There is also a lytic left-sided sternal manubrial lesion with SUV max of 28.7. Destructive lytic lesion involving the transverse process of T7 vertebral body has an SUV max of 6.80 Several vertebral body lesions are noted but no spinal canal compromise. Right hip and proximal femoral lesions have an SUV max 7.62 Incidental CT findings: none IMPRESSION: 1. Hypermetabolic right hilar mass consistent with known primary neoplasm. Associated right paratracheal and subcarinal lymphadenopathy. 2. No findings for abdominal/pelvic metastatic disease. 3. Diffuse osseous metastatic disease as detailed above. Electronically Signed   By: Marijo Sanes M.D.   On: 08/05/2021 11:15   DG Chest Port 1 View  Result Date: 08/25/2021 CLINICAL DATA:  Dyspnea EXAM: PORTABLE CHEST 1 VIEW COMPARISON:  08/22/2021 FINDINGS: Interval increase in heterogeneous airspace opacity and volume loss of the right hemithorax. Underlying treated right lung mass, better appreciated by CT. The heart and mediastinum are unremarkable. The left lung is normally aerated. IMPRESSION: 1. Interval increase in heterogeneous airspace opacity and volume loss of the right hemithorax, concerning for atelectasis and infection/aspiration. 2.  Underlying treated right lung mass, better appreciated by CT. Electronically Signed   By: Eddie Candle M.D.   On: 08/25/2021 08:08   Portable chest 1 View  Result Date: 08/22/2021 CLINICAL DATA:  Shortness of breath over the last few months.  COPD. EXAM: PORTABLE CHEST 1 VIEW COMPARISON:  CTA chest 08/14/2021 FINDINGS: Emphysema. Progressive right basilar airspace opacity favoring pneumonia, worsened from 08/14/2021. Right hilar/infrahilar mass is  somewhat obscured by the rightward rotation on the frontal projection. Right mediastinal prominence compatible with known paratracheal adenopathy. The patient has known lytic bone lesions which are poorly appreciated on today's conventional radiographs compared to the prior MRI. IMPRESSION: 1. Progressive airspace opacity at the right lung base compatible with pneumonia. 2. Known right infrahilar mass and right mediastinal prominence compatible with paratracheal adenopathy. 3.  Emphysema (ICD10-J43.9). Electronically Signed   By: Van Clines M.D.   On: 08/22/2021 14:10   DG Chest Port 1 View  Result Date: 08/14/2021 CLINICAL DATA:  Hypoxia, shortness of breath, recent COVID. Known lung cancer EXAM: PORTABLE CHEST 1 VIEW COMPARISON:  Chest radiograph 08/09/2021 FINDINGS: The cardiomediastinal silhouette is stable, allowing for significant rightward patient rotation. Increased interstitial markings seen throughout both lungs are grossly similar. Opacity in the right lower lobe has improved. There is no new focal airspace disease. There is no pleural effusion. There is no pneumothorax. The known right hilar mass and mediastinal lymphadenopathy are not well evaluated partially due to patient rotation. There is no acute osseous abnormality. IMPRESSION: 1. Improved opacities in the right middle lobe. No new focal airspace disease. 2. The known right hilar mass and associated lymphadenopathy are not well evaluated. Electronically Signed   By: Valetta Mole M.D.   On: 08/14/2021 17:30   DG Swallowing Func-Speech Pathology  Result Date: 08/23/2021 Table formatting from the original result was not included. Objective Swallowing Evaluation: Type of Study: Bedside Swallow Evaluation  Patient Details Name: DAYMOND CORDTS MRN: [id number] Date of Birth: 1951-08-29 Today's Date: 08/23/2021 Time: SLP Start Time (ACUTE ONLY): 0847 -SLP Stop Time (ACUTE ONLY): 0912 SLP Time Calculation (min) (ACUTE ONLY): 25 min Past Medical  History: Past Medical History: Diagnosis Date  Anxiety disorder   Aortic atherosclerosis ([place]) 02/26/2021  Seen on CT scan 2020  Cancer [facility])   Chronic insomnia   Chronic low back pain   COPD (chronic obstructive pulmonary disease) (HCC)   GERD (gastroesophageal reflux disease) 08/29/2017  Hx of right BKA ([place])   Secondary to trauma  Hyperlipidemia  Past Surgical History: No past surgical history on file. HPI: 70 year old man presenting from SNF with acute on chronic failure to thrive, anorexia, weight loss, hemoptysis found to have acute on chronic hypoxemic respiratory failure.  Pt with advanced lung cancer. He was scheduled for bronchoscopy today but was too unstable with high HR and worsening O2 need.  Pt with recent loss of voice and "inability to tolerate PO". CT of neck 9/13 noted: "Hyperenhancing mass adjacent to the left transverse process of C1 with expansion of the adjacent left obliquus capitis inferior muscle, most consistent with metastatic disease". Pt was to have fiberoptic bronchoscopy on 08/22/2021 but was too weak for interventions.  Subjective: Pt was reluctant to accept trials towards the end of the study and cited fatigue and the reason for this. With encourgement, pt accepted a bolus of advanced solids, but trials were still limited. Assessment / Plan / Recommendation CHL IP CLINICAL IMPRESSIONS 08/23/2021 Clinical Impression Pt presented with oropharyngeal dysphgia characterized by prolonged mastication, reduced lingual retraction, reduced pharyngeal constriction, and reduced anterior laryngeal movement. He demonstrated vallecular residue, posterior pharyngeal wall residue, and pyriform sinus residue. Residue was improved with a chin tuck combined with a left head position. Penetration (PAS 5) was noted with thin liquids and increased in frequency as the study progressed. Penetration was partly secondary to a mild pharyngeal delay and from pyriform sinus residue. No definitive aspiration was  noted during the study while fluoro was on, but aspiration is suspected due to weak coughing noted after episodes of aspiration. This was improved with a chin tuck with left head turn, but pt demonstrated difficulty consistently demonstrating this. A dysphagia 2 diet with nectar thick liquids is recommended at this time, with potential for liberalization with consistency is demonstrated with strategies/GOC change. SLP will follow for dysphagia treatment. SLP Visit Diagnosis Dysphagia, oropharyngeal phase (R13.12) Attention and concentration deficit following -- Frontal lobe and executive function deficit following -- Impact on safety and function Moderate aspiration risk;Mild aspiration risk   CHL IP TREATMENT RECOMMENDATION 08/23/2021 Treatment Recommendations Therapy as outlined in treatment plan below   Prognosis 08/23/2021 Prognosis for Safe Diet Advancement Fair Barriers to Reach Goals Severity of deficits;Time post onset Barriers/Prognosis Comment -- CHL IP DIET RECOMMENDATION 08/23/2021 SLP Diet Recommendations Dysphagia 2 (Fine chop) solids;Nectar thick liquid Liquid Administration via Cup;No straw Medication Administration Crushed with puree Compensations Slow rate;Small sips/bites;Follow solids with liquid;Chin tuck Postural Changes Seated upright at 90 degrees   CHL IP OTHER RECOMMENDATIONS 08/23/2021 Recommended Consults -- Oral Care Recommendations Oral care BID Other Recommendations --   CH[facility] IP FOLLOW UP RECOMMENDATIONS 08/23/2021 Follow up Recommendations Skilled Nursing facility   [facility] IP FREQUENCY AND DURATION 08/23/2021 Speech Therapy Frequency (ACUTE ONLY) min 2x/week Treatment Duration 2 weeks      CHL IP ORAL PHASE 08/23/2021 Oral Phase Impaired Oral - Pudding Teaspoon -- Oral - Pudding Cup -- Oral - Honey Teaspoon -- Oral - Honey Cup -- Oral - Nectar Teaspoon -- Oral - Nectar Cup -- Oral - Nectar Straw -- Oral - Thin Teaspoon -- Oral - Thin Cup -- Oral - Thin Straw -- Oral - Puree -- Oral - Mech Soft  Impaired mastication Oral - Regular -- Oral - Multi-Consistency -- Oral - Pill -- Oral Phase - Comment --  CH[facility] IP PHARYNGEAL PHASE 08/23/2021 Pharyngeal Phase Impaired Pharyngeal- Pudding Teaspoon -- Pharyngeal -- Pharyngeal- Pudding Cup -- Pharyngeal -- Pharyngeal-  Honey Teaspoon -- Pharyngeal -- Pharyngeal- Honey Cup -- Pharyngeal -- Pharyngeal- Nectar Teaspoon -- Pharyngeal -- Pharyngeal- Nectar Cup Reduced tongue base retraction;Pharyngeal residue - valleculae;Pharyngeal residue - pyriform;Pharyngeal residue - posterior pharnyx;Reduced laryngeal elevation;Reduced pharyngeal peristalsis Pharyngeal -- Pharyngeal- Nectar Straw Reduced tongue base retraction;Pharyngeal residue - valleculae;Pharyngeal residue - pyriform;Pharyngeal residue - posterior pharnyx;Reduced laryngeal elevation;Reduced pharyngeal peristalsis Pharyngeal -- Pharyngeal- Thin Teaspoon -- Pharyngeal -- Pharyngeal- Thin Cup Reduced tongue base retraction;Pharyngeal residue - valleculae;Pharyngeal residue - pyriform;Pharyngeal residue - posterior pharnyx;Reduced laryngeal elevation;Reduced pharyngeal peristalsis;Penetration/Aspiration during swallow;Penetration/Apiration after swallow Pharyngeal Material enters airway, CONTACTS cords and not ejected out Pharyngeal- Thin Straw Reduced tongue base retraction;Pharyngeal residue - valleculae;Pharyngeal residue - pyriform;Pharyngeal residue - posterior pharnyx;Reduced laryngeal elevation;Reduced pharyngeal peristalsis;Penetration/Aspiration during swallow Pharyngeal Material enters airway, CONTACTS cords and not ejected out Pharyngeal- Puree Reduced tongue base retraction;Pharyngeal residue - valleculae;Pharyngeal residue - pyriform;Pharyngeal residue - posterior pharnyx;Reduced laryngeal elevation;Reduced pharyngeal peristalsis Pharyngeal -- Pharyngeal- Mechanical Soft Reduced tongue base retraction;Pharyngeal residue - valleculae;Pharyngeal residue - pyriform;Pharyngeal residue - posterior  pharnyx;Reduced laryngeal elevation;Reduced pharyngeal peristalsis Pharyngeal -- Pharyngeal- Regular -- Pharyngeal -- Pharyngeal- Multi-consistency -- Pharyngeal -- Pharyngeal- Pill -- Pharyngeal -- Pharyngeal Comment --  CHL IP CERVICAL ESOPHAGEAL PHASE 08/23/2021 Cervical Esophageal Phase (No Data) Pudding Teaspoon -- Pudding Cup -- Honey Teaspoon -- Honey Cup -- Nectar Teaspoon -- Nectar Cup -- Nectar Straw -- Thin Teaspoon -- Thin Cup -- Thin Straw -- Puree -- Mechanical Soft -- Regular -- Multi-consistency -- Pill -- Cervical Esophageal Comment -- Shanika I. Hardin Negus, [place], [place] Office number [phone] Pager [phone] Horton Marshall 08/23/2021, 9:51 AM              US Abdomen Limited RUQ (LIVER/GB)  Result Date: 08/15/2021 CLINICAL DATA:  Generalized abdominal pain x3 weeks EXAM: ULTRASOUND ABDOMEN LIMITED RIGHT UPPER QUADRANT COMPARISON:  None. FINDINGS: Gallbladder: Sludge and mobile gallbladder stones are seen within the gallbladder fundus. The gallbladder, however, is not distended, there is no gallbladder wall thickening, and no pericholecystic fluid is identified. The sonographic Percell Miller sign is reportedly negative. Common bile duct: Diameter: 4 mm in proximal diameter Liver: No focal lesion identified. Within normal limits in parenchymal echogenicity. Portal vein is patent on color Doppler imaging with normal direction of blood flow towards the liver. Other: None. IMPRESSION: Cholelithiasis without sonographic evidence of acute cholecystitis. Electronically Signed   By: Fidela Salisbury M.D.   On: 08/15/2021 12:05     Procedures: palliative and pulmonary   Subjective: Patient continue to have dyspnea and chest pain, no nausea or vomiting, continue to be very weak and deconditioned   Discharge Exam: Vitals:   08/29/21 0753 08/29/21 0800  BP:  106/62  Pulse:  (!) 119  Resp: (!) 21 20  Temp:    SpO2:  93%   Vitals:   08/28/21 2010 08/28/21 2012  08/29/21 0753 08/29/21 0800  BP:    106/62  Pulse:    (!) 119  Resp:   (!) 21 20  Temp:      TempSrc:      SpO2: (!) 75% 94%  93%  Weight:      Height:        General: positive dyspnea at rest.  Neurology: Awake and alert, non focal  E ENT: mild pallor, no icterus, oral mucosa moist Cardiovascular: No JVD. S1-S2 present, rhythmic, no gallops, rubs, or murmurs. No lower extremity edema. Pulmonary: vesicular breath sounds bilaterally, adequate air movement, no wheezing, rhonchi or rales. Gastrointestinal. Abdomen soft  and non tender Skin. No rashes Musculoskeletal: right BKA  The results of significant diagnostics from this hospitalization (including imaging, microbiology, ancillary and laboratory) are listed below for reference.     Microbiology: Recent Results (from the past 240 hour(s))  SARS Coronavirus 2 by RT PCR (hospital order, performed in [facility] hospital lab) Nasopharyngeal Nasopharyngeal Swab     Status: None   Collection Time: 08/22/21 12:05 PM   Specimen: Nasopharyngeal Swab  Result Value Ref Range Status   SARS Coronavirus 2 NEGATIVE NEGATIVE Final    Comment: (NOTE) SARS-CoV-2 target nucleic acids are NOT DETECTED.  The SARS-CoV-2 RNA is generally detectable in upper and lower respiratory specimens during the acute phase of infection. The lowest concentration of SARS-CoV-2 viral copies this assay can detect is 250 copies / mL. A negative result does not preclude SARS-CoV-2 infection and should not be used as the sole basis for treatment or other patient management decisions.  A negative result may occur with improper specimen collection / handling, submission of specimen other than nasopharyngeal swab, presence of viral mutation(s) within the areas targeted by this assay, and inadequate number of viral copies (<250 copies / mL). A negative result must be combined with clinical observations, patient history, and epidemiological information.  Fact Sheet  for Patients:   StrictlyIdeas.no  Fact Sheet for Healthcare Providers: BankingDealers.co.za  This test is not yet approved or  cleared by the [place] FDA and has been authorized for detection and/or diagnosis of SARS-CoV-2 by FDA under an Emergency Use Authorization (EUA).  This EUA will remain in effect (meaning this test can be used) for the duration of the COVID-19 declaration under Section 564(b)(1) of the Act, 21 U.S.C. section 360bbb-3(b)(1), unless the authorization is terminated or revoked sooner.  Performed at [facility], [place] [address] [place], [place] [address]      Labs: BNP (last 3 results) No results for input(s): BNP in the last 8760 hours. Basic Metabolic Panel: Recent Labs  Lab 08/22/21 1534 08/23/21 0234 08/24/21 0219 08/26/21 0319  NA 135 136 137 135  K 4.1 3.7 4.0 4.4  CL 89* 91* 95* 90*  CO2 32 35* 33* 37*  GLUCOSE 160* 131* 183* 110*  BUN 19 22 21 15   CREATININE 0.98 0.87 0.66 0.58*  CALCIUM 9.8 9.4 9.0 9.3  MG 1.8 1.9  --   --    Liver Function Tests: Recent Labs  Lab 08/22/21 1534  AST 98*  ALT 50*  ALKPHOS 181*  BILITOT 1.1  PROT 6.3*  ALBUMIN 2.3*   No results for input(s): LIPASE, AMYLASE in the last 168 hours. No results for input(s): AMMONIA in the last 168 hours. CBC: Recent Labs  Lab 08/22/21 1534 08/23/21 0234 08/24/21 0219 08/26/21 0319  WBC 4.8 4.1 3.0* 3.2*  NEUTROABS 1.8  --   --   --   HGB 12.7* 11.6* 11.1* 10.8*  HCT 36.3* 34.2* 32.3* 31.2*  MCV 85.8 87.5 86.4 86.4  PLT 39* 33* 27* 23*   Cardiac Enzymes: No results for input(s): CKTOTAL, CKMB, CKMBINDEX, TROPONINI in the last 168 hours. BNP: Invalid input(s): POCBNP CBG: No results for input(s): GLUCAP in the last 168 hours. D-Dimer No results for input(s): DDIMER in the last 72 hours. Hgb A1c No results for input(s): HGBA1C in the last 72 hours. Lipid Profile No results for input(s): CHOL,  HDL, LDLCALC, TRIG, CHOLHDL, LDLDIRECT in the last 72 hours. Thyroid function studies No results for input(s): TSH, T4TOTAL, T3FREE,  THYROIDAB in the last 72 hours.  Invalid input(s): FREET3 Anemia work up No results for input(s): VITAMINB12, FOLATE, FERRITIN, TIBC, IRON, RETICCTPCT in the last 72 hours. Urinalysis    Component Value Date/Time   COLORURINE YELLOW 04/29/2015 2123   APPEARANCEUR CLEAR 04/29/2015 2123   LABSPEC 1.010 04/29/2015 2123   PHURINE 6.0 04/29/2015 2123   GLUCOSEU NEGATIVE 04/29/2015 2123   HGBUR NEGATIVE 04/29/2015 2123   BILIRUBINUR NEGATIVE 04/29/2015 2123   KETONESUR TRACE (A) 04/29/2015 2123   PROTEINUR NEGATIVE 04/29/2015 2123   UROBILINOGEN 0.2 04/29/2015 2123   NITRITE NEGATIVE 04/29/2015 2123   LEUKOCYTESUR NEGATIVE 04/29/2015 2123   Sepsis Labs Invalid input(s): PROCALCITONIN,  WBC,  LACTICIDVEN Microbiology Recent Results (from the past 240 hour(s))  SARS Coronavirus 2 by RT PCR (hospital order, performed in [name] hospital lab) Nasopharyngeal Nasopharyngeal Swab     Status: None   Collection Time: 08/22/21 12:05 PM   Specimen: Nasopharyngeal Swab  Result Value Ref Range Status   SARS Coronavirus 2 NEGATIVE NEGATIVE Final    Comment: (NOTE) SARS-CoV-2 target nucleic acids are NOT DETECTED.  The SARS-CoV-2 RNA is generally detectable in upper and lower respiratory specimens during the acute phase of infection. The lowest concentration of SARS-CoV-2 viral copies this assay can detect is 250 copies / mL. A negative result does not preclude SARS-CoV-2 infection and should not be used as the sole basis for treatment or other patient management decisions.  A negative result may occur with improper specimen collection / handling, submission of specimen other than nasopharyngeal swab, presence of viral mutation(s) within the areas targeted by this assay, and inadequate number of viral copies (<250 copies / mL). A negative result must be  combined with clinical observations, patient history, and epidemiological information.  Fact Sheet for Patients:   StrictlyIdeas.no  Fact Sheet for Healthcare Providers: BankingDealers.co.za  This test is not yet approved or  cleared by the [place] FDA and has been authorized for detection and/or diagnosis of SARS-CoV-2 by FDA under an Emergency Use Authorization (EUA).  This EUA will remain in effect (meaning this test can be used) for the duration of the COVID-19 declaration under Section 564(b)(1) of the Act, 21 U.S.C. section 360bbb-3(b)(1), unless the authorization is terminated or revoked sooner.  Performed at [facility], [place] [address] [place], [place] [address]      Time coordinating discharge: 45 minutes  SIGNED:   Tawni Millers, MD  [facility] 08/29/2021, 11:21 AM

## 2021-08-30 ENCOUNTER — Ambulatory Visit (HOSPITAL_COMMUNITY): Payer: Medicare Other | Admitting: Hematology

## 2021-08-30 DIAGNOSIS — R918 Other nonspecific abnormal finding of lung field: Secondary | ICD-10-CM | POA: Diagnosis not present

## 2021-08-30 DIAGNOSIS — Z515 Encounter for palliative care: Secondary | ICD-10-CM | POA: Diagnosis not present

## 2021-08-30 DIAGNOSIS — R590 Localized enlarged lymph nodes: Secondary | ICD-10-CM

## 2021-08-30 DIAGNOSIS — J9621 Acute and chronic respiratory failure with hypoxia: Secondary | ICD-10-CM | POA: Diagnosis not present

## 2021-08-30 DIAGNOSIS — Z7189 Other specified counseling: Secondary | ICD-10-CM | POA: Diagnosis not present

## 2021-08-30 LAB — RESP PANEL BY RT-PCR (FLU A&B, COVID) ARPGX2
Influenza A by PCR: NEGATIVE
Influenza B by PCR: NEGATIVE
SARS Coronavirus 2 by RT PCR: NEGATIVE

## 2021-08-30 MED ORDER — IPRATROPIUM-ALBUTEROL 0.5-2.5 (3) MG/3ML IN SOLN
3.0000 mL | Freq: Two times a day (BID) | RESPIRATORY_TRACT | Status: DC
  Administered 2021-08-31: 3 mL via RESPIRATORY_TRACT
  Filled 2021-08-30 (×2): qty 3

## 2021-08-30 NOTE — TOC Progression Note (Signed)
Transition of Care [facility]) - Progression Note    Patient Details  Name: [name] MRN: [id number] Date of Birth: June 18, 1951  [facility] CM/SW [place], RN Phone Number:[phone]  08/30/2021, 12:23 PM  Clinical Narrative:    Bed is available at [facility] [place]. Patient has been approved and bed is available. Facility is requesting that transport not be arranged until tomorrow morning 9/29. MD has been made aware. Patient and family at bedside made aware. CM will set up transport in the am.     Expected Discharge Plan: [place] Barriers to Discharge: No Barriers Identified  Expected Discharge Plan and Services Expected Discharge Plan: [place] In-house Referral: NA Discharge Planning Services: CM Consult, NA Post Acute Care Choice: NA Living arrangements for the past 2 months: [place] Expected Discharge Date: 08/30/21               DME Arranged: N/A DME Agency: NA       HH Arranged: NA HH Agency: NA         Social Determinants of Health (SDOH) Interventions    Readmission Risk Interventions Readmission Risk Prevention Plan 08/24/2021  Transportation Screening Complete  PCP or Specialist Appt within 5-7 Days Complete  Home Care Screening Complete  Medication Review (RN CM) Referral to Pharmacy  Some recent data might be hidden

## 2021-08-30 NOTE — Discharge Summary (Signed)
Physician Discharge Summary  [name] WFU:[id number] DOB: 09/24/1951 DOA: 08/22/2021  PCP: Gerald Drown, MD  Admit date: 08/22/2021 Discharge date: 08/30/2021  Admitted From: SNF  Disposition: residential hospice   Recommendations for Outpatient Follow-up and new medication changes:  Follow up with Dr. Nicki Kim as scheduled.  Follow up with hospice team.   Home Health: na   Equipment/Devices: na    Discharge Condition: Stable CODE STATUS: DNR   Diet recommendation: regular.   Brief/Interim Summary: 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  Transitioned to comfort care pending transfer to residential hospice.    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  He has anorexia, weight loss and hemoptysis, diagnosed with likely lung cancer pending tissue biopsy. Positive COVID 19 on 07/2021. On his initial physical examination his blood pressure 139/92, heart rate 128, temperature 98.2, respiratory rate 25, oxygen saturation 88% on supplemental oxygen, he had decreased breath sounds bilaterally, heart S1-S2, present, tachycardic, irregular, abdomen soft, lower extremities with right BKA.   Sodium 135, potassium 4.1, chloride 89, bicarb 32, glucose 160, BUN 19, creatinine 0.98, magnesium 1.8, white count 4.8, hemoglobin 12.7, hematocrit 36.3, platelets 39. SARS COVID-19 negative.   Chest radiograph with hyperinflation, right lower lobe opacity, left base atelectasis.   EKG 123 bpm, normal axis, normal intervals, multifocal atrial tachycardia, no significant ST segment or T wave changes.     Patient placed on aggressive bronchodilator therapy and systemic steroids. Developed worsening thrombocytopenia.    Telemetry with multifocal atrial tachycardia and started on metoprolol   Patient very weak and deconditioned,continue to have high oxygen requirements.  Poor prognosis.  Palliative care was consulted and patient was transition to comfort care, now pending transfer to residential hospice.   Right lower lobe bronchogenic carcinoma, pending tissue diagnosis (C1 metastatic disease), COPD exacerbation with acute on chronic hypoxic respiratory failure.  Postobstructive pneumonia. Patient received aggressive medical therapy including systemic corticosteroids, bronchodilators, inhaled steroids and airway clearing techniques. Speech evaluation recommended dysphagia diet with aspiration precautions. Unfortunately he continued to have persistent dyspnea and high oxygen requirements.  Using high flow nasal cannula 11 to 10 L/min to keep oxygen saturation greater than 90%.  Because of persistent symptoms and poor prognosis palliative care was consulted, patient decided to change care to comfort measures.  Patient is being transferred/discharged to residential hospice today. No diagnostic bronchoscopy was performed.  2.  Multifocal atrial tachycardia.  Patient has been placed on metoprolol 50 g twice daily with good toleration. His heart rate has remained in the low 100s.  3.  Swallow dysfunction.  Dysphagia diet with aspiration precautions. At discharge on regular diet for comfort care.   4.  Anxiety and depression.  On mirtazapine, nortriptyline and lorazepam.  5.  Thrombocytopenia, anemia and leukopenia.  Likely related to malignancy, he did not received blood products transfusion. Positive ecchymosis but no active bleeding.  6.  Hypomagnesemia, hypokalemia, chronic metabolic alkalosis.  His electrolytes were corrected, no further blood work was performed considering comfort  care.  7.  Severe protein calorie malnutrition.  Patient received nutritional supplements.  Discharge Diagnoses:  Principal Problem:   Acute on chronic respiratory failure with hypoxia (HCC) Active Problems:  Other chronic pain   Chronic obstructive pulmonary disease (HCC)   Mediastinal lymphadenopathy   Lung mass   Adenopathy   Protein-calorie malnutrition, severe   DNR (do not resuscitate)   Palliative care by specialist   Anxiety   Lung cancer ([place])   Pancytopenia ([place])    Discharge Instructions   Allergies as of 08/30/2021       Reactions   Asa [aspirin] Other (See Comments)   Intolerance to aspirin due to ulcer   Benadryl [diphenhydramine Hcl] Other (See Comments)   Nervousness, insomnia        Medication List     STOP taking these medications    ALPRAZolam 0.5 MG tablet Commonly known as: XANAX       TAKE these medications    acetaminophen 500 MG tablet Commonly known as: TYLENOL Take 500 mg by mouth every 6 (six) hours as needed for mild pain, fever or headache.   feeding supplement Liqd Take 237 mLs by mouth 3 (three) times daily between meals.   hyoscyamine 0.125 MG SL tablet Commonly known as: LEVSIN SL PLACE 1 TABLET UNDER THE TONGUE EVERY 6 HOURS AS NEEDED FOR CRAMPS   ipratropium-albuterol 0.5-2.5 (3) MG/3ML Soln Commonly known as: DUONEB Take 3 mLs by nebulization 3 (three) times daily.   megestrol 400 MG/10ML suspension Commonly known as: MEGACE Take 10 mLs (400 mg total) by mouth 2 (two) times daily.   metoprolol tartrate 50 MG tablet Commonly known as: LOPRESSOR Take 1 tablet (50 mg total) by mouth 2 (two) times daily.   mirtazapine 15 MG disintegrating tablet Commonly known as: REMERON SOL-TAB Take 0.5 tablets (7.5 mg total) by mouth at bedtime.   mometasone-formoterol 200-5 MCG/ACT Aero Commonly known as: DULERA Inhale 2 puffs into the lungs 2 (two) times daily.   multivitamin with minerals Tabs tablet Take 1 tablet  by mouth daily.   nortriptyline 50 MG capsule Commonly known as: PAMELOR Take 2 capsules (100 mg total) by mouth at bedtime.   omeprazole 40 MG capsule Commonly known as: PRILOSEC Take 1 capsule (40 mg total) by mouth daily.   polyethylene glycol 17 g packet Commonly known as: MIRALAX / GLYCOLAX Take 17 g by mouth daily.   predniSONE 5 MG tablet Commonly known as: DELTASONE Take 15 mg by mouth daily with breakfast.   senna 8.6 MG Tabs tablet Commonly known as: SENOKOT Take 1 tablet (8.6 mg total) by mouth daily as needed for mild constipation.   Tussin DM 10-100 MG/5ML liquid Generic drug: dextromethorphan-guaiFENesin Take 5 mLs by mouth every 4 (four) hours as needed for cough.        Allergies  Allergen Reactions   Asa [Aspirin] Other (See Comments)    Intolerance to aspirin due to ulcer   Benadryl [Diphenhydramine Hcl] Other (See Comments)    Nervousness, insomnia    Consultations: Pulmonary  Palliative Care.    Procedures/Studies: DG Chest 2 View  Result Date: 08/09/2021 CLINICAL DATA:  69 year old male with history of shortness of breath. History of non-small cell lung cancer. EXAM: CHEST - 2 VIEW COMPARISON:  Chest x-ray 07/04/2021. FINDINGS: Ill-defined opacity in the right lower lobe compatible with residual airspace consolidation. Left lung is clear. No pneumothorax. No evidence of pulmonary edema. Extensive emphysematous changes are noted. Masslike fullness in the right hilar region and extensive thickening of the right paratracheal soft tissues again noted. IMPRESSION: 1. Right hilar and right paratracheal lymphadenopathy with some residual postobstructive pneumonia in the right lower lobe, similar to  prior examinations. 2. Emphysema. Electronically Signed   By: Vinnie Langton M.D.   On: 08/09/2021 21:40   CT Soft Tissue Neck W Contrast  Result Date: 08/15/2021 CLINICAL DATA:  Soft tissue mass of the neck EXAM: CT NECK WITH CONTRAST TECHNIQUE:  Multidetector CT imaging of the neck was performed using the standard protocol following the bolus administration of intravenous contrast. CONTRAST:  158mL OMNIPAQUE IOHEXOL 350 MG/ML SOLN COMPARISON:  None. FINDINGS: PHARYNX AND LARYNX: The nasopharynx, oropharynx and larynx are normal. Visible portions of the oral cavity, tongue base and floor of mouth are normal. Normal epiglottis, vallecula and pyriform sinuses. The larynx is normal. No retropharyngeal abscess, effusion or lymphadenopathy. SALIVARY GLANDS: Normal parotid, submandibular and sublingual glands. THYROID: Normal. LYMPH NODES: Hyperenhancing mass adjacent to the left transverse process of C1 measuring 3.0 x 1.5 cm. There is expansion of the adjacent left obliquus capitis inferior muscle (3:31). The mass likely extends into the left C1 transverse foramen. VASCULAR: Major cervical vessels are patent. LIMITED INTRACRANIAL: Normal. VISUALIZED ORBITS: Normal. MASTOIDS AND VISUALIZED PARANASAL SINUSES: No fluid levels or advanced mucosal thickening. No mastoid effusion. SKELETON: No bony spinal canal stenosis. No lytic or blastic lesions. UPPER CHEST: Partially necrotic mass of the upper mediastinum measures approximately 3.4 x 3.5 cm and is better characterized on concomitant CTA chest. There is emphysema. OTHER: None. IMPRESSION: 1. Hyperenhancing mass adjacent to the left transverse process of C1 with expansion of the adjacent left obliquus capitis inferior muscle, most consistent with metastatic disease. The mass likely extends into the left C1 transverse foramen but the left vertebral artery remains patent. 2. Partially necrotic mass of the upper mediastinum, better characterized on concomitant CTA chest. Emphysema (ICD10-J43.9). Electronically Signed   By: Ulyses Jarred M.D.   On: 08/15/2021 00:46   CT Angio Chest PE W and/or Wo Contrast  Result Date: 08/15/2021 CLINICAL DATA:  Tachycardia, weakness, dehydration.  Lung cancer. EXAM: CT ANGIOGRAPHY  CHEST WITH CONTRAST TECHNIQUE: Multidetector CT imaging of the chest was performed using the standard protocol during bolus administration of intravenous contrast. Multiplanar CT image reconstructions and MIPs were obtained to evaluate the vascular anatomy. CONTRAST:  167mL OMNIPAQUE IOHEXOL 350 MG/ML SOLN COMPARISON:  PET-CT dated 08/03/2021 FINDINGS: Cardiovascular: Satisfactory opacification the bilateral pulmonary arteries to the segmental level. No evidence of pulmonary embolism. Although not tailored for evaluation of the thoracic aorta, there is no evidence of thoracic aortic aneurysm or dissection. Heart is normal in size.  No pericardial effusion. Coronary atherosclerosis of the LAD and right coronary artery. Mediastinum/Nodes: Bulky mediastinal lymphadenopathy, unchanged from recent PET. Dominant right paratracheal node measures 3.1 cm short axis (series 5/image 28). Visualized thyroid is unremarkable. Lungs/Pleura: Bulky right infrahilar/right lower lobe mass narrowing the right lower lobe bronchus, measuring 4.1 x 6.4 cm (series 5/image 46), unchanged from recent PET. Mild postobstructive opacity in the right lower lobe (series 7/image 102). Moderate centrilobular and paraseptal emphysematous changes, upper lung predominant. No pleural effusion or pneumothorax. Upper Abdomen: Visualized upper abdomen is notable for a small hiatal hernia. Musculoskeletal: Multifocal lytic metastases, including the right sternum (series 5/image 12) and the left T7 transverse process (series 5/image 40). Additional vertebral body lesions are better evaluated on recent PET. Review of the MIP images confirms the above findings. IMPRESSION: No evidence of pulmonary embolism. Bulky right infrahilar/right lower lobe mass, corresponding to the patient's known primary bronchogenic neoplasm, unchanged from recent PET. Bulky mediastinal lymphadenopathy. Multifocal osseous metastases, better evaluated on recent PET. Aortic  Atherosclerosis (ICD10-I70.0) and  Emphysema (ICD10-J43.9). Electronically Signed   By: Julian Hy M.D.   On: 08/15/2021 00:36   MR Brain W Wo Contrast  Result Date: 08/05/2021 CLINICAL DATA:  Malignant neoplasm of unspecified part of unspecified bronchus or lung. Non-small-cell lung cancer, staging. EXAM: MRI HEAD WITHOUT AND WITH CONTRAST TECHNIQUE: Multiplanar, multiecho pulse sequences of the brain and surrounding structures were obtained without and with intravenous contrast. CONTRAST:  7.55mL GADAVIST GADOBUTROL 1 MMOL/ML IV SOLN COMPARISON:  PET-CT 08/03/2021. brain MRI 04/30/2015. FINDINGS: Brain: Mild intermittent motion degradation. Mild generalized cerebral and cerebellar atrophy. Mild multifocal T2/FLAIR hyperintensity within the cerebral white matter, nonspecific but compatible with chronic small vessel ischemic disease. There is no acute infarct. No evidence of an intracranial mass. No chronic intracranial blood products. No extra-axial fluid collection. No midline shift. No pathologic intracranial enhancement to suggest intracranial metastatic disease. Vascular: Maintained flow voids within the proximal large arterial vessels. Skull and upper cervical spine: Osseous and/or soft tissue mass in the region of the left C1 lateral mass (series 7, image 16). Hypermetabolism was present at this site on the prior PET-CT of 08/03/2021. Ill-defined focus of enhancement within the left parietal calvarium, which could reflect a focus of osseous metastatic disease (for instance as seen on series 18, image 127). Sinuses/Orbits: Visualized orbits show no acute finding. Frothy secretions and mild mucosal thickening within the left sphenoid sinus. Mild mucosal thickening within the right ethmoid and maxillary sinuses. IMPRESSION: Motion degraded exam. No evidence of intracranial metastatic disease. Osseous and/or soft tissue mass in the region of the left C1 lateral mass. Hypermetabolism was present at this  site on the prior PET-CT of 08/03/2021. A contrast-enhanced cervical spine MRI should be considered for further evaluation. Ill-defined focus of enhancement within the left parietal calvarium, nonspecific but possibly reflecting a site of osseous metastatic disease. Mild chronic small-vessel ischemic changes within the cerebral white matter. Mild generalized parenchymal atrophy. Paranasal sinus disease, as described. Correlate for acute sinusitis Electronically Signed   By: Kellie Simmering D.O.   On: 08/05/2021 14:37   NM PET Image Initial (PI) Skull Base To Thigh (F-18 FDG)  Result Date: 08/05/2021 CLINICAL DATA:  Initial treatment strategy for non-small cell lung cancer. EXAM: NUCLEAR MEDICINE PET SKULL BASE TO THIGH TECHNIQUE: 8.58 mCi F-18 FDG was injected intravenously. Full-ring PET imaging was performed from the skull base to thigh after the radiotracer. CT data was obtained and used for attenuation correction and anatomic localization. Fasting blood glucose: 151 mg/dl COMPARISON:  CT scan 07/21/2021 FINDINGS: Mediastinal blood pool activity: SUV max 2.14 Liver activity: SUV max NA NECK: No hypermetabolic lymph nodes in the neck. Incidental CT findings: none CHEST: The right hilar/central lung mass is hypermetabolic with SUV max of 4.40. Associated right hilar, right paratracheal and subcarinal lymphadenopathy. The partially necrotic right paratracheal node has an SUV max of 7.99. No supraclavicular or axillary adenopathy. Mild hypermetabolism in the atelectatic right lower lobe is likely postobstructive pneumonitis. No findings metastatic pulmonary nodules. Incidental CT findings: Stable underlying emphysematous changes and pulmonary scarring. Stable scattered atherosclerotic calcifications involving the aorta and coronary arteries. ABDOMEN/PELVIS: No findings to suggest abdominal/pelvic metastatic disease. No hepatic adrenal gland lesions or. Incidental CT findings: Numerous small layering gallstones are  noted in the gallbladder. Moderate scattered arterial calcifications. SKELETON: Diffuse osseous metastatic disease. There is a lytic lesion involving the right proximal humeral shaft which has an SUV max 8.35. Right sternal manubrial lesion is destroying the posterior cortex. SUV max is 7.76. There is also  a lytic left-sided sternal manubrial lesion with SUV max of 28.7. Destructive lytic lesion involving the transverse process of T7 vertebral body has an SUV max of 6.80 Several vertebral body lesions are noted but no spinal canal compromise. Right hip and proximal femoral lesions have an SUV max 7.62 Incidental CT findings: none IMPRESSION: 1. Hypermetabolic right hilar mass consistent with known primary neoplasm. Associated right paratracheal and subcarinal lymphadenopathy. 2. No findings for abdominal/pelvic metastatic disease. 3. Diffuse osseous metastatic disease as detailed above. Electronically Signed   By: Marijo Sanes M.D.   On: 08/05/2021 11:15   DG Chest Port 1 View  Result Date: 08/25/2021 CLINICAL DATA:  Dyspnea EXAM: PORTABLE CHEST 1 VIEW COMPARISON:  08/22/2021 FINDINGS: Interval increase in heterogeneous airspace opacity and volume loss of the right hemithorax. Underlying treated right lung mass, better appreciated by CT. The heart and mediastinum are unremarkable. The left lung is normally aerated. IMPRESSION: 1. Interval increase in heterogeneous airspace opacity and volume loss of the right hemithorax, concerning for atelectasis and infection/aspiration. 2.  Underlying treated right lung mass, better appreciated by CT. Electronically Signed   By: Eddie Candle M.D.   On: 08/25/2021 08:08   Portable chest 1 View  Result Date: 08/22/2021 CLINICAL DATA:  Shortness of breath over the last few months.  COPD. EXAM: PORTABLE CHEST 1 VIEW COMPARISON:  CTA chest 08/14/2021 FINDINGS: Emphysema. Progressive right basilar airspace opacity favoring pneumonia, worsened from 08/14/2021. Right  hilar/infrahilar mass is somewhat obscured by the rightward rotation on the frontal projection. Right mediastinal prominence compatible with known paratracheal adenopathy. The patient has known lytic bone lesions which are poorly appreciated on today's conventional radiographs compared to the prior MRI. IMPRESSION: 1. Progressive airspace opacity at the right lung base compatible with pneumonia. 2. Known right infrahilar mass and right mediastinal prominence compatible with paratracheal adenopathy. 3.  Emphysema (ICD10-J43.9). Electronically Signed   By: Van Clines M.D.   On: 08/22/2021 14:10   DG Chest Port 1 View  Result Date: 08/14/2021 CLINICAL DATA:  Hypoxia, shortness of breath, recent COVID. Known lung cancer EXAM: PORTABLE CHEST 1 VIEW COMPARISON:  Chest radiograph 08/09/2021 FINDINGS: The cardiomediastinal silhouette is stable, allowing for significant rightward patient rotation. Increased interstitial markings seen throughout both lungs are grossly similar. Opacity in the right lower lobe has improved. There is no new focal airspace disease. There is no pleural effusion. There is no pneumothorax. The known right hilar mass and mediastinal lymphadenopathy are not well evaluated partially due to patient rotation. There is no acute osseous abnormality. IMPRESSION: 1. Improved opacities in the right middle lobe. No new focal airspace disease. 2. The known right hilar mass and associated lymphadenopathy are not well evaluated. Electronically Signed   By: Valetta Mole M.D.   On: 08/14/2021 17:30   DG Swallowing Func-Speech Pathology  Result Date: 08/23/2021 Table formatting from the original result was not included. Objective Swallowing Evaluation: Type of Study: Bedside Swallow Evaluation  Patient Details Name: [name] MRN: [id number] Date of Birth: [birth year] Today's Date: 08/23/2021 Time: SLP Start Time (ACUTE ONLY): 0847 -SLP Stop Time (ACUTE ONLY): 0912 SLP Time Calculation (min) (ACUTE  ONLY): 25 min Past Medical History: Past Medical History: Diagnosis Date  Anxiety disorder   Aortic atherosclerosis ([place]) 02/26/2021  Seen on CT scan 2020  Cancer [facility])   Chronic insomnia   Chronic low back pain   COPD (chronic obstructive pulmonary disease) (HCC)   GERD (gastroesophageal reflux disease) 08/29/2017  Hx of right BKA (  [place])   Secondary to trauma  Hyperlipidemia  Past Surgical History: No past surgical history on file. HPI: 70 year old man presenting from SNF with acute on chronic failure to thrive, anorexia, weight loss, hemoptysis found to have acute on chronic hypoxemic respiratory failure.  Pt with advanced lung cancer. He was scheduled for bronchoscopy today but was too unstable with high HR and worsening O2 need.  Pt with recent loss of voice and "inability to tolerate PO". CT of neck 9/13 noted: "Hyperenhancing mass adjacent to the left transverse process of C1 with expansion of the adjacent left obliquus capitis inferior muscle, most consistent with metastatic disease". Pt was to have fiberoptic bronchoscopy on 08/22/2021 but was too weak for interventions.  Subjective: Pt was reluctant to accept trials towards the end of the study and cited fatigue and the reason for this. With encourgement, pt accepted a bolus of advanced solids, but trials were still limited. Assessment / Plan / Recommendation CHL IP CLINICAL IMPRESSIONS 08/23/2021 Clinical Impression Pt presented with oropharyngeal dysphgia characterized by prolonged mastication, reduced lingual retraction, reduced pharyngeal constriction, and reduced anterior laryngeal movement. He demonstrated vallecular residue, posterior pharyngeal wall residue, and pyriform sinus residue. Residue was improved with a chin tuck combined with a left head position. Penetration (PAS 5) was noted with thin liquids and increased in frequency as the study progressed. Penetration was partly secondary to a mild pharyngeal delay and from pyriform sinus residue. No  definitive aspiration was noted during the study while fluoro was on, but aspiration is suspected due to weak coughing noted after episodes of aspiration. This was improved with a chin tuck with left head turn, but pt demonstrated difficulty consistently demonstrating this. A dysphagia 2 diet with nectar thick liquids is recommended at this time, with potential for liberalization with consistency is demonstrated with strategies/GOC change. SLP will follow for dysphagia treatment. SLP Visit Diagnosis Dysphagia, oropharyngeal phase (R13.12) Attention and concentration deficit following -- Frontal lobe and executive function deficit following -- Impact on safety and function Moderate aspiration risk;Mild aspiration risk   CHL IP TREATMENT RECOMMENDATION 08/23/2021 Treatment Recommendations Therapy as outlined in treatment plan below   Prognosis 08/23/2021 Prognosis for Safe Diet Advancement Fair Barriers to Reach Goals Severity of deficits;Time post onset Barriers/Prognosis Comment -- CHL IP DIET RECOMMENDATION 08/23/2021 SLP Diet Recommendations Dysphagia 2 (Fine chop) solids;Nectar thick liquid Liquid Administration via Cup;No straw Medication Administration Crushed with puree Compensations Slow rate;Small sips/bites;Follow solids with liquid;Chin tuck Postural Changes Seated upright at 90 degrees   CHL IP OTHER RECOMMENDATIONS 08/23/2021 Recommended Consults -- Oral Care Recommendations Oral care BID Other Recommendations --   CHL IP FOLLOW UP RECOMMENDATIONS 08/23/2021 Follow up Recommendations Skilled Nursing facility   [facility] IP FREQUENCY AND DURATION 08/23/2021 Speech Therapy Frequency (ACUTE ONLY) min 2x/week Treatment Duration 2 weeks      CHL IP ORAL PHASE 08/23/2021 Oral Phase Impaired Oral - Pudding Teaspoon -- Oral - Pudding Cup -- Oral - Honey Teaspoon -- Oral - Honey Cup -- Oral - Nectar Teaspoon -- Oral - Nectar Cup -- Oral - Nectar Straw -- Oral - Thin Teaspoon -- Oral - Thin Cup -- Oral - Thin Straw -- Oral -  Puree -- Oral - Mech Soft Impaired mastication Oral - Regular -- Oral - Multi-Consistency -- Oral - Pill -- Oral Phase - Comment --  CHL IP PHARYNGEAL PHASE 08/23/2021 Pharyngeal Phase Impaired Pharyngeal- Pudding Teaspoon -- Pharyngeal -- Pharyngeal- Pudding Cup -- Pharyngeal -- Pharyngeal- Honey Teaspoon -- Pharyngeal --  Pharyngeal- Honey Cup -- Pharyngeal -- Pharyngeal- Nectar Teaspoon -- Pharyngeal -- Pharyngeal- Nectar Cup Reduced tongue base retraction;Pharyngeal residue - valleculae;Pharyngeal residue - pyriform;Pharyngeal residue - posterior pharnyx;Reduced laryngeal elevation;Reduced pharyngeal peristalsis Pharyngeal -- Pharyngeal- Nectar Straw Reduced tongue base retraction;Pharyngeal residue - valleculae;Pharyngeal residue - pyriform;Pharyngeal residue - posterior pharnyx;Reduced laryngeal elevation;Reduced pharyngeal peristalsis Pharyngeal -- Pharyngeal- Thin Teaspoon -- Pharyngeal -- Pharyngeal- Thin Cup Reduced tongue base retraction;Pharyngeal residue - valleculae;Pharyngeal residue - pyriform;Pharyngeal residue - posterior pharnyx;Reduced laryngeal elevation;Reduced pharyngeal peristalsis;Penetration/Aspiration during swallow;Penetration/Apiration after swallow Pharyngeal Material enters airway, CONTACTS cords and not ejected out Pharyngeal- Thin Straw Reduced tongue base retraction;Pharyngeal residue - valleculae;Pharyngeal residue - pyriform;Pharyngeal residue - posterior pharnyx;Reduced laryngeal elevation;Reduced pharyngeal peristalsis;Penetration/Aspiration during swallow Pharyngeal Material enters airway, CONTACTS cords and not ejected out Pharyngeal- Puree Reduced tongue base retraction;Pharyngeal residue - valleculae;Pharyngeal residue - pyriform;Pharyngeal residue - posterior pharnyx;Reduced laryngeal elevation;Reduced pharyngeal peristalsis Pharyngeal -- Pharyngeal- Mechanical Soft Reduced tongue base retraction;Pharyngeal residue - valleculae;Pharyngeal residue - pyriform;Pharyngeal residue  - posterior pharnyx;Reduced laryngeal elevation;Reduced pharyngeal peristalsis Pharyngeal -- Pharyngeal- Regular -- Pharyngeal -- Pharyngeal- Multi-consistency -- Pharyngeal -- Pharyngeal- Pill -- Pharyngeal -- Pharyngeal Comment --  CHL IP CERVICAL ESOPHAGEAL PHASE 08/23/2021 Cervical Esophageal Phase (No Data) Pudding Teaspoon -- Pudding Cup -- Honey Teaspoon -- Honey Cup -- Nectar Teaspoon -- Nectar Cup -- Nectar Straw -- Thin Teaspoon -- Thin Cup -- Thin Straw -- Puree -- Mechanical Soft -- Regular -- Multi-consistency -- Pill -- Cervical Esophageal Comment -- Shanika I. Hardin Negus, [place], [place] Office number [phone] Pager [phone] Horton Marshall 08/23/2021, 9:51 AM              US Abdomen Limited RUQ (LIVER/GB)  Result Date: 08/15/2021 CLINICAL DATA:  Generalized abdominal pain x3 weeks EXAM: ULTRASOUND ABDOMEN LIMITED RIGHT UPPER QUADRANT COMPARISON:  None. FINDINGS: Gallbladder: Sludge and mobile gallbladder stones are seen within the gallbladder fundus. The gallbladder, however, is not distended, there is no gallbladder wall thickening, and no pericholecystic fluid is identified. The sonographic Percell Miller sign is reportedly negative. Common bile duct: Diameter: 4 mm in proximal diameter Liver: No focal lesion identified. Within normal limits in parenchymal echogenicity. Portal vein is patent on color Doppler imaging with normal direction of blood flow towards the liver. Other: None. IMPRESSION: Cholelithiasis without sonographic evidence of acute cholecystitis. Electronically Signed   By: Fidela Salisbury M.D.   On: 08/15/2021 12:05     Procedures: palliative and pulmonary   Subjective: Seen and examined.  He has no complaints.  He is comfortable.  Discharge Exam: Vitals:   08/29/21 1924 08/29/21 1957  BP: 109/75   Pulse: (!) 113   Resp: 20   Temp: 98.2 F (36.8 C)   SpO2: 95% 94%   Vitals:   08/29/21 0800 08/29/21 1141 08/29/21 1924 08/29/21 1957  BP:  106/62 108/71 109/75   Pulse: (!) 119 (!) 101 (!) 113   Resp: 20 18 20    Temp:  98.2 F (36.8 C) 98.2 F (36.8 C)   TempSrc:      SpO2: 93% (!) 83% 95% 94%  Weight:      Height:        General exam: Appears calm and comfortable  Respiratory system: Clear to auscultation. Respiratory effort normal. Cardiovascular system: S1 & S2 heard, RRR. No JVD, murmurs, rubs, gallops or clicks. No pedal edema. Gastrointestinal system: Abdomen is nondistended, soft and nontender. No organomegaly or masses felt. Normal bowel sounds heard. Central nervous system: Alert and oriented. No focal  neurological deficits. Extremities: Right BKA   The results of significant diagnostics from this hospitalization (including imaging, microbiology, ancillary and laboratory) are listed below for reference.     Microbiology: Recent Results (from the past 240 hour(s))  SARS Coronavirus 2 by RT PCR (hospital order, performed in [facility] hospital lab) Nasopharyngeal Nasopharyngeal Swab     Status: None   Collection Time: 08/22/21 12:05 PM   Specimen: Nasopharyngeal Swab  Result Value Ref Range Status   SARS Coronavirus 2 NEGATIVE NEGATIVE Final    Comment: (NOTE) SARS-CoV-2 target nucleic acids are NOT DETECTED.  The SARS-CoV-2 RNA is generally detectable in upper and lower respiratory specimens during the acute phase of infection. The lowest concentration of SARS-CoV-2 viral copies this assay can detect is 250 copies / mL. A negative result does not preclude SARS-CoV-2 infection and should not be used as the sole basis for treatment or other patient management decisions.  A negative result may occur with improper specimen collection / handling, submission of specimen other than nasopharyngeal swab, presence of viral mutation(s) within the areas targeted by this assay, and inadequate number of viral copies (<250 copies / mL). A negative result must be combined with clinical observations, patient history,  and epidemiological information.  Fact Sheet for Patients:   StrictlyIdeas.no  Fact Sheet for Healthcare Providers: BankingDealers.co.za  This test is not yet approved or  cleared by the [place] FDA and has been authorized for detection and/or diagnosis of SARS-CoV-2 by FDA under an Emergency Use Authorization (EUA).  This EUA will remain in effect (meaning this test can be used) for the duration of the COVID-19 declaration under Section 564(b)(1) of the Act, 21 U.S.C. section 360bbb-3(b)(1), unless the authorization is terminated or revoked sooner.  Performed at [facility], [place] [address], [place], [place] [address]      Labs: BNP (last 3 results) No results for input(s): BNP in the last 8760 hours. Basic Metabolic Panel: Recent Labs  Lab 08/24/21 0219 08/26/21 0319  NA 137 135  K 4.0 4.4  CL 95* 90*  CO2 33* 37*  GLUCOSE 183* 110*  BUN 21 15  CREATININE 0.66 0.58*  CALCIUM 9.0 9.3   Liver Function Tests: No results for input(s): AST, ALT, ALKPHOS, BILITOT, PROT, ALBUMIN in the last 168 hours.  No results for input(s): LIPASE, AMYLASE in the last 168 hours. No results for input(s): AMMONIA in the last 168 hours. CBC: Recent Labs  Lab 08/24/21 0219 08/26/21 0319  WBC 3.0* 3.2*  HGB 11.1* 10.8*  HCT 32.3* 31.2*  MCV 86.4 86.4  PLT 27* 23*   Cardiac Enzymes: No results for input(s): CKTOTAL, CKMB, CKMBINDEX, TROPONINI in the last 168 hours. BNP: Invalid input(s): POCBNP CBG: No results for input(s): GLUCAP in the last 168 hours. D-Dimer No results for input(s): DDIMER in the last 72 hours. Hgb A1c No results for input(s): HGBA1C in the last 72 hours. Lipid Profile No results for input(s): CHOL, HDL, LDLCALC, TRIG, CHOLHDL, LDLDIRECT in the last 72 hours. Thyroid function studies No results for input(s): TSH, T4TOTAL, T3FREE, THYROIDAB in the last 72 hours.  Invalid input(s): FREET3 Anemia  work up No results for input(s): VITAMINB12, FOLATE, FERRITIN, TIBC, IRON, RETICCTPCT in the last 72 hours. Urinalysis    Component Value Date/Time   COLORURINE YELLOW 04/29/2015 2123   APPEARANCEUR CLEAR 04/29/2015 2123   LABSPEC 1.010 04/29/2015 2123   PHURINE 6.0 04/29/2015 2123   GLUCOSEU NEGATIVE 04/29/2015 2123   HGBUR NEGATIVE  04/29/2015 2123   BILIRUBINUR NEGATIVE 04/29/2015 2123   KETONESUR TRACE (A) 04/29/2015 2123   PROTEINUR NEGATIVE 04/29/2015 2123   UROBILINOGEN 0.2 04/29/2015 2123   NITRITE NEGATIVE 04/29/2015 2123   LEUKOCYTESUR NEGATIVE 04/29/2015 2123   Sepsis Labs Invalid input(s): PROCALCITONIN,  WBC,  LACTICIDVEN Microbiology Recent Results (from the past 240 hour(s))  SARS Coronavirus 2 by RT PCR (hospital order, performed in [facility] hospital lab) Nasopharyngeal Nasopharyngeal Swab     Status: None   Collection Time: 08/22/21 12:05 PM   Specimen: Nasopharyngeal Swab  Result Value Ref Range Status   SARS Coronavirus 2 NEGATIVE NEGATIVE Final    Comment: (NOTE) SARS-CoV-2 target nucleic acids are NOT DETECTED.  The SARS-CoV-2 RNA is generally detectable in upper and lower respiratory specimens during the acute phase of infection. The lowest concentration of SARS-CoV-2 viral copies this assay can detect is 250 copies / mL. A negative result does not preclude SARS-CoV-2 infection and should not be used as the sole basis for treatment or other patient management decisions.  A negative result may occur with improper specimen collection / handling, submission of specimen other than nasopharyngeal swab, presence of viral mutation(s) within the areas targeted by this assay, and inadequate number of viral copies (<250 copies / mL). A negative result must be combined with clinical observations, patient history, and epidemiological information.  Fact Sheet for Patients:   StrictlyIdeas.no  Fact Sheet for Healthcare  Providers: BankingDealers.co.za  This test is not yet approved or  cleared by the [place] FDA and has been authorized for detection and/or diagnosis of SARS-CoV-2 by FDA under an Emergency Use Authorization (EUA).  This EUA will remain in effect (meaning this test can be used) for the duration of the COVID-19 declaration under Section 564(b)(1) of the Act, 21 U.S.C. section 360bbb-3(b)(1), unless the authorization is terminated or revoked sooner.  Performed at [facility], [place] [address] [place], [place] [address]      Time coordinating discharge: 45 minutes  SIGNED:   Darliss Cheney, MD  [facility] 08/30/2021, 11:29 AM

## 2021-08-30 NOTE — Care Management Important Message (Signed)
Important Message  Patient Details  Name: [name] MRN: [id number] Date of Birth: 1951/07/10   Medicare Important Message Given:  Yes     Brahim Dolman Montine Circle 08/30/2021, 9:39 AM

## 2021-08-30 NOTE — Progress Notes (Signed)
After completing all the discharge paperwork, discharge summary, I was notified by [facility] that the hospice facility has called back and has requested to discharge patient tomorrow since they are unable to accept him today.  Discharge canceled.

## 2021-08-30 NOTE — Plan of Care (Signed)
  Problem: Clinical Measurements: Goal: Ability to maintain clinical measurements within normal limits will improve Outcome: Not Progressing Goal: Respiratory complications will improve Outcome: Not Progressing   Problem: Activity: Goal: Risk for activity intolerance will decrease Outcome: Not Progressing   Problem: Nutrition: Goal: Adequate nutrition will be maintained Outcome: Not Progressing   Problem: Education: Goal: Knowledge of General Education information will improve Description: Including pain rating scale, medication(s)/side effects and non-pharmacologic comfort measures Outcome: Progressing   Problem: Clinical Measurements: Goal: Will remain free from infection Outcome: Progressing   Problem: Coping: Goal: Level of anxiety will decrease Outcome: Progressing
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
## 2021-08-31 ENCOUNTER — Telehealth: Payer: Self-pay | Admitting: Family Medicine

## 2021-08-31 DIAGNOSIS — J9621 Acute and chronic respiratory failure with hypoxia: Secondary | ICD-10-CM | POA: Diagnosis not present

## 2021-08-31 NOTE — Discharge Summary (Signed)
Physician Discharge Summary  VALENTIN BENNEY ZOX:[id number] DOB: 1951/11/21 DOA: 08/22/2021  PCP: [name], MD  Admit date: 08/22/2021 Discharge date: 08/31/2021  Admitted From: SNF  Disposition: residential hospice   Recommendations for Outpatient Follow-up and new medication changes:  Follow up with Dr. Nicki Reaper as scheduled.  Follow up with hospice team.   Home Health: na   Equipment/Devices: na    Discharge Condition: Stable CODE STATUS: DNR   Diet recommendation: regular.   Brief/Interim Summary: Mr. 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  Transitioned to comfort care pending transfer to residential hospice.    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  He has anorexia, weight loss and hemoptysis, diagnosed with likely lung cancer pending tissue biopsy. Positive COVID 19 on 07/2021. On his initial physical examination his blood pressure 139/92, heart rate 128, temperature 98.2, respiratory rate 25, oxygen saturation 88% on supplemental oxygen, he had decreased breath sounds bilaterally, heart S1-S2, present, tachycardic, irregular, abdomen soft, lower extremities with right BKA.   Sodium 135, potassium 4.1, chloride 89, bicarb 32, glucose 160, BUN 19, creatinine 0.98, magnesium 1.8, white count 4.8, hemoglobin 12.7, hematocrit 36.3, platelets 39. SARS COVID-19 negative.   Chest radiograph with hyperinflation, right lower lobe opacity, left base atelectasis.   EKG 123 bpm, normal axis, normal intervals, multifocal atrial tachycardia, no significant ST segment or T wave changes.     Patient placed on aggressive bronchodilator therapy and systemic steroids. Developed worsening thrombocytopenia.    Telemetry with multifocal atrial tachycardia and started on metoprolol   Patient very weak and deconditioned,continue to have high oxygen requirements.  Poor prognosis.  Palliative care was consulted and patient was transition to comfort care, now pending transfer to residential hospice.   Right lower lobe bronchogenic carcinoma, pending tissue diagnosis (C1 metastatic disease), COPD exacerbation with acute on chronic hypoxic respiratory failure.  Postobstructive pneumonia. Patient received aggressive medical therapy including systemic corticosteroids, bronchodilators, inhaled steroids and airway clearing techniques. Speech evaluation recommended dysphagia diet with aspiration precautions. Unfortunately he continued to have persistent dyspnea and high oxygen requirements.  Using high flow nasal cannula 11 to 10 L/min to keep oxygen saturation greater than 90%.  Because of persistent symptoms and poor prognosis palliative care was consulted, patient decided to change care to comfort measures.  Patient is being transferred/discharged to residential hospice today. No diagnostic bronchoscopy was performed.  2.  Multifocal atrial tachycardia.  Patient has been placed on metoprolol 50 g twice daily with good toleration. His heart rate has remained in the low 100s.  3.  Swallow dysfunction.  Dysphagia diet with aspiration precautions. At discharge on regular diet for comfort care.   4.  Anxiety and depression.  On mirtazapine, nortriptyline and lorazepam.  5.  Thrombocytopenia, anemia and leukopenia.  Likely related to malignancy, he did not received blood products transfusion. Positive ecchymosis but no active bleeding.  6.  Hypomagnesemia, hypokalemia, chronic metabolic alkalosis.  His electrolytes were corrected, no further blood work was performed considering comfort  care.  7.  Severe protein calorie malnutrition.  Patient received nutritional supplements.  PS: Patient was discharged yesterday however after all the paperwork was completed, I was notified that the  facility is unable to take the patient on 08/30/2021 and has requested to discharge today so patient stayed overnight.  He has remained stable as he could be.  He is being discharged today to hospice facility.  Discharge Diagnoses:  Principal Problem:   Acute on chronic respiratory failure with hypoxia (HCC) Active Problems:   Other chronic pain   Chronic obstructive pulmonary disease (HCC)   Mediastinal lymphadenopathy   Lung mass   Adenopathy   Protein-calorie malnutrition, severe   DNR (do not resuscitate)   Palliative care by specialist   Anxiety   Lung cancer ([place])   Pancytopenia ([place])    Discharge Instructions   Allergies as of 08/31/2021       Reactions   Asa [aspirin] Other (See Comments)   Intolerance to aspirin due to ulcer   Benadryl [diphenhydramine Hcl] Other (See Comments)   Nervousness, insomnia        Medication List     STOP taking these medications    ALPRAZolam 0.5 MG tablet Commonly known as: XANAX       TAKE these medications    acetaminophen 500 MG tablet Commonly known as: TYLENOL Take 500 mg by mouth every 6 (six) hours as needed for mild pain, fever or headache.   feeding supplement Liqd Take 237 mLs by mouth 3 (three) times daily between meals.   hyoscyamine 0.125 MG SL tablet Commonly known as: LEVSIN SL PLACE 1 TABLET UNDER THE TONGUE EVERY 6 HOURS AS NEEDED FOR CRAMPS   ipratropium-albuterol 0.5-2.5 (3) MG/3ML Soln Commonly known as: DUONEB Take 3 mLs by nebulization 3 (three) times daily.   megestrol 400 MG/10ML suspension Commonly known as: MEGACE Take 10 mLs (400 mg total) by mouth 2 (two) times daily.   metoprolol tartrate 50 MG tablet Commonly known as: LOPRESSOR Take 1 tablet (50 mg total) by mouth 2 (two) times  daily.   mirtazapine 15 MG disintegrating tablet Commonly known as: REMERON SOL-TAB Take 0.5 tablets (7.5 mg total) by mouth at bedtime.   mometasone-formoterol 200-5 MCG/ACT Aero Commonly known as: DULERA Inhale 2 puffs into the lungs 2 (two) times daily.   multivitamin with minerals Tabs tablet Take 1 tablet by mouth daily.   nortriptyline 50 MG capsule Commonly known as: PAMELOR Take 2 capsules (100 mg total) by mouth at bedtime.   omeprazole 40 MG capsule Commonly known as: PRILOSEC Take 1 capsule (40 mg total) by mouth daily.   polyethylene glycol 17 g packet Commonly known as: MIRALAX / GLYCOLAX Take 17 g by mouth daily.   predniSONE 5 MG tablet Commonly known as: DELTASONE Take 15 mg by mouth daily with breakfast.   senna 8.6 MG Tabs tablet Commonly known as: SENOKOT Take 1 tablet (8.6 mg total) by mouth daily as needed for mild constipation.   Tussin DM 10-100 MG/5ML liquid Generic drug: dextromethorphan-guaiFENesin Take 5 mLs by mouth every 4 (four) hours as needed for cough.        Allergies  Allergen Reactions   Asa [Aspirin] Other (See Comments)    Intolerance to aspirin due to ulcer   Benadryl [Diphenhydramine Hcl] Other (See Comments)    Nervousness, insomnia    Consultations: Pulmonary  Palliative Care.    Procedures/Studies: DG Chest 2 View  Result Date: 08/09/2021 CLINICAL DATA:  70 year old male with history of shortness of breath. History of non-small cell lung cancer. EXAM: CHEST - 2 VIEW COMPARISON:  Chest x-ray 07/04/2021. FINDINGS: Ill-defined opacity in the right lower lobe compatible with  residual airspace consolidation. Left lung is clear. No pneumothorax. No evidence of pulmonary edema. Extensive emphysematous changes are noted. Masslike fullness in the right hilar region and extensive thickening of the right paratracheal soft tissues again noted. IMPRESSION: 1. Right hilar and right paratracheal lymphadenopathy with some residual  postobstructive pneumonia in the right lower lobe, similar to prior examinations. 2. Emphysema. Electronically Signed   By: Vinnie Langton M.D.   On: 08/09/2021 21:40   CT Soft Tissue Neck W Contrast  Result Date: 08/15/2021 CLINICAL DATA:  Soft tissue mass of the neck EXAM: CT NECK WITH CONTRAST TECHNIQUE: Multidetector CT imaging of the neck was performed using the standard protocol following the bolus administration of intravenous contrast. CONTRAST:  16mL OMNIPAQUE IOHEXOL 350 MG/ML SOLN COMPARISON:  None. FINDINGS: PHARYNX AND LARYNX: The nasopharynx, oropharynx and larynx are normal. Visible portions of the oral cavity, tongue base and floor of mouth are normal. Normal epiglottis, vallecula and pyriform sinuses. The larynx is normal. No retropharyngeal abscess, effusion or lymphadenopathy. SALIVARY GLANDS: Normal parotid, submandibular and sublingual glands. THYROID: Normal. LYMPH NODES: Hyperenhancing mass adjacent to the left transverse process of C1 measuring 3.0 x 1.5 cm. There is expansion of the adjacent left obliquus capitis inferior muscle (3:31). The mass likely extends into the left C1 transverse foramen. VASCULAR: Major cervical vessels are patent. LIMITED INTRACRANIAL: Normal. VISUALIZED ORBITS: Normal. MASTOIDS AND VISUALIZED PARANASAL SINUSES: No fluid levels or advanced mucosal thickening. No mastoid effusion. SKELETON: No bony spinal canal stenosis. No lytic or blastic lesions. UPPER CHEST: Partially necrotic mass of the upper mediastinum measures approximately 3.4 x 3.5 cm and is better characterized on concomitant CTA chest. There is emphysema. OTHER: None. IMPRESSION: 1. Hyperenhancing mass adjacent to the left transverse process of C1 with expansion of the adjacent left obliquus capitis inferior muscle, most consistent with metastatic disease. The mass likely extends into the left C1 transverse foramen but the left vertebral artery remains patent. 2. Partially necrotic mass of the  upper mediastinum, better characterized on concomitant CTA chest. Emphysema (ICD10-J43.9). Electronically Signed   By: Ulyses Jarred M.D.   On: 08/15/2021 00:46   CT Angio Chest PE W and/or Wo Contrast  Result Date: 08/15/2021 CLINICAL DATA:  Tachycardia, weakness, dehydration.  Lung cancer. EXAM: CT ANGIOGRAPHY CHEST WITH CONTRAST TECHNIQUE: Multidetector CT imaging of the chest was performed using the standard protocol during bolus administration of intravenous contrast. Multiplanar CT image reconstructions and MIPs were obtained to evaluate the vascular anatomy. CONTRAST:  168mL OMNIPAQUE IOHEXOL 350 MG/ML SOLN COMPARISON:  PET-CT dated 08/03/2021 FINDINGS: Cardiovascular: Satisfactory opacification the bilateral pulmonary arteries to the segmental level. No evidence of pulmonary embolism. Although not tailored for evaluation of the thoracic aorta, there is no evidence of thoracic aortic aneurysm or dissection. Heart is normal in size.  No pericardial effusion. Coronary atherosclerosis of the LAD and right coronary artery. Mediastinum/Nodes: Bulky mediastinal lymphadenopathy, unchanged from recent PET. Dominant right paratracheal node measures 3.1 cm short axis (series 5/image 28). Visualized thyroid is unremarkable. Lungs/Pleura: Bulky right infrahilar/right lower lobe mass narrowing the right lower lobe bronchus, measuring 4.1 x 6.4 cm (series 5/image 46), unchanged from recent PET. Mild postobstructive opacity in the right lower lobe (series 7/image 102). Moderate centrilobular and paraseptal emphysematous changes, upper lung predominant. No pleural effusion or pneumothorax. Upper Abdomen: Visualized upper abdomen is notable for a small hiatal hernia. Musculoskeletal: Multifocal lytic metastases, including the right sternum (series 5/image 12) and the left T7 transverse process (series 5/image 40). Additional vertebral  body lesions are better evaluated on recent PET. Review of the MIP images confirms the  above findings. IMPRESSION: No evidence of pulmonary embolism. Bulky right infrahilar/right lower lobe mass, corresponding to the patient's known primary bronchogenic neoplasm, unchanged from recent PET. Bulky mediastinal lymphadenopathy. Multifocal osseous metastases, better evaluated on recent PET. Aortic Atherosclerosis (ICD10-I70.0) and Emphysema (ICD10-J43.9). Electronically Signed   By: Julian Hy M.D.   On: 08/15/2021 00:36   MR Brain W Wo Contrast  Result Date: 08/05/2021 CLINICAL DATA:  Malignant neoplasm of unspecified part of unspecified bronchus or lung. Non-small-cell lung cancer, staging. EXAM: MRI HEAD WITHOUT AND WITH CONTRAST TECHNIQUE: Multiplanar, multiecho pulse sequences of the brain and surrounding structures were obtained without and with intravenous contrast. CONTRAST:  7.63mL GADAVIST GADOBUTROL 1 MMOL/ML IV SOLN COMPARISON:  PET-CT 08/03/2021. brain MRI 04/30/2015. FINDINGS: Brain: Mild intermittent motion degradation. Mild generalized cerebral and cerebellar atrophy. Mild multifocal T2/FLAIR hyperintensity within the cerebral white matter, nonspecific but compatible with chronic small vessel ischemic disease. There is no acute infarct. No evidence of an intracranial mass. No chronic intracranial blood products. No extra-axial fluid collection. No midline shift. No pathologic intracranial enhancement to suggest intracranial metastatic disease. Vascular: Maintained flow voids within the proximal large arterial vessels. Skull and upper cervical spine: Osseous and/or soft tissue mass in the region of the left C1 lateral mass (series 7, image 16). Hypermetabolism was present at this site on the prior PET-CT of 08/03/2021. Ill-defined focus of enhancement within the left parietal calvarium, which could reflect a focus of osseous metastatic disease (for instance as seen on series 18, image 127). Sinuses/Orbits: Visualized orbits show no acute finding. Frothy secretions and mild mucosal  thickening within the left sphenoid sinus. Mild mucosal thickening within the right ethmoid and maxillary sinuses. IMPRESSION: Motion degraded exam. No evidence of intracranial metastatic disease. Osseous and/or soft tissue mass in the region of the left C1 lateral mass. Hypermetabolism was present at this site on the prior PET-CT of 08/03/2021. A contrast-enhanced cervical spine MRI should be considered for further evaluation. Ill-defined focus of enhancement within the left parietal calvarium, nonspecific but possibly reflecting a site of osseous metastatic disease. Mild chronic small-vessel ischemic changes within the cerebral white matter. Mild generalized parenchymal atrophy. Paranasal sinus disease, as described. Correlate for acute sinusitis Electronically Signed   By: Kellie Simmering D.O.   On: 08/05/2021 14:37   NM PET Image Initial (PI) Skull Base To Thigh (F-18 FDG)  Result Date: 08/05/2021 CLINICAL DATA:  Initial treatment strategy for non-small cell lung cancer. EXAM: NUCLEAR MEDICINE PET SKULL BASE TO THIGH TECHNIQUE: 8.58 mCi F-18 FDG was injected intravenously. Full-ring PET imaging was performed from the skull base to thigh after the radiotracer. CT data was obtained and used for attenuation correction and anatomic localization. Fasting blood glucose: 151 mg/dl COMPARISON:  CT scan 07/21/2021 FINDINGS: Mediastinal blood pool activity: SUV max 2.14 Liver activity: SUV max NA NECK: No hypermetabolic lymph nodes in the neck. Incidental CT findings: none CHEST: The right hilar/central lung mass is hypermetabolic with SUV max of 9.37. Associated right hilar, right paratracheal and subcarinal lymphadenopathy. The partially necrotic right paratracheal node has an SUV max of 7.99. No supraclavicular or axillary adenopathy. Mild hypermetabolism in the atelectatic right lower lobe is likely postobstructive pneumonitis. No findings metastatic pulmonary nodules. Incidental CT findings: Stable underlying  emphysematous changes and pulmonary scarring. Stable scattered atherosclerotic calcifications involving the aorta and coronary arteries. ABDOMEN/PELVIS: No findings to suggest abdominal/pelvic metastatic disease. No hepatic adrenal  gland lesions or. Incidental CT findings: Numerous small layering gallstones are noted in the gallbladder. Moderate scattered arterial calcifications. SKELETON: Diffuse osseous metastatic disease. There is a lytic lesion involving the right proximal humeral shaft which has an SUV max 8.35. Right sternal manubrial lesion is destroying the posterior cortex. SUV max is 7.76. There is also a lytic left-sided sternal manubrial lesion with SUV max of 28.7. Destructive lytic lesion involving the transverse process of T7 vertebral body has an SUV max of 6.80 Several vertebral body lesions are noted but no spinal canal compromise. Right hip and proximal femoral lesions have an SUV max 7.62 Incidental CT findings: none IMPRESSION: 1. Hypermetabolic right hilar mass consistent with known primary neoplasm. Associated right paratracheal and subcarinal lymphadenopathy. 2. No findings for abdominal/pelvic metastatic disease. 3. Diffuse osseous metastatic disease as detailed above. Electronically Signed   By: Marijo Sanes M.D.   On: 08/05/2021 11:15   DG Chest Port 1 View  Result Date: 08/25/2021 CLINICAL DATA:  Dyspnea EXAM: PORTABLE CHEST 1 VIEW COMPARISON:  08/22/2021 FINDINGS: Interval increase in heterogeneous airspace opacity and volume loss of the right hemithorax. Underlying treated right lung mass, better appreciated by CT. The heart and mediastinum are unremarkable. The left lung is normally aerated. IMPRESSION: 1. Interval increase in heterogeneous airspace opacity and volume loss of the right hemithorax, concerning for atelectasis and infection/aspiration. 2.  Underlying treated right lung mass, better appreciated by CT. Electronically Signed   By: Eddie Candle M.D.   On: 08/25/2021  08:08   Portable chest 1 View  Result Date: 08/22/2021 CLINICAL DATA:  Shortness of breath over the last few months.  COPD. EXAM: PORTABLE CHEST 1 VIEW COMPARISON:  CTA chest 08/14/2021 FINDINGS: Emphysema. Progressive right basilar airspace opacity favoring pneumonia, worsened from 08/14/2021. Right hilar/infrahilar mass is somewhat obscured by the rightward rotation on the frontal projection. Right mediastinal prominence compatible with known paratracheal adenopathy. The patient has known lytic bone lesions which are poorly appreciated on today's conventional radiographs compared to the prior MRI. IMPRESSION: 1. Progressive airspace opacity at the right lung base compatible with pneumonia. 2. Known right infrahilar mass and right mediastinal prominence compatible with paratracheal adenopathy. 3.  Emphysema (ICD10-J43.9). Electronically Signed   By: Van Clines M.D.   On: 08/22/2021 14:10   DG Chest Port 1 View  Result Date: 08/14/2021 CLINICAL DATA:  Hypoxia, shortness of breath, recent COVID. Known lung cancer EXAM: PORTABLE CHEST 1 VIEW COMPARISON:  Chest radiograph 08/09/2021 FINDINGS: The cardiomediastinal silhouette is stable, allowing for significant rightward patient rotation. Increased interstitial markings seen throughout both lungs are grossly similar. Opacity in the right lower lobe has improved. There is no new focal airspace disease. There is no pleural effusion. There is no pneumothorax. The known right hilar mass and mediastinal lymphadenopathy are not well evaluated partially due to patient rotation. There is no acute osseous abnormality. IMPRESSION: 1. Improved opacities in the right middle lobe. No new focal airspace disease. 2. The known right hilar mass and associated lymphadenopathy are not well evaluated. Electronically Signed   By: Valetta Mole M.D.   On: 08/14/2021 17:30   DG Swallowing Func-Speech Pathology  Result Date: 08/23/2021 Table formatting from the original  result was not included. Objective Swallowing Evaluation: Type of Study: Bedside Swallow Evaluation  Patient Details Name: ACEA YAGI MRN: [id number] Date of Birth: 1951-05-19 Today's Date: 08/23/2021 Time: SLP Start Time (ACUTE ONLY): 0847 -SLP Stop Time (ACUTE ONLY): 0912 SLP Time Calculation (min) (ACUTE ONLY): 25  min Past Medical History: Past Medical History: Diagnosis Date  Anxiety disorder   Aortic atherosclerosis ([place]) 02/26/2021  Seen on CT scan 2020  Cancer [facility])   Chronic insomnia   Chronic low back pain   COPD (chronic obstructive pulmonary disease) (HCC)   GERD (gastroesophageal reflux disease) 08/29/2017  Hx of right BKA (HCC)   Secondary to trauma  Hyperlipidemia  Past Surgical History: No past surgical history on file. HPI: 70 year old man presenting from SNF with acute on chronic failure to thrive, anorexia, weight loss, hemoptysis found to have acute on chronic hypoxemic respiratory failure.  Pt with advanced lung cancer. He was scheduled for bronchoscopy today but was too unstable with high HR and worsening O2 need.  Pt with recent loss of voice and "inability to tolerate PO". CT of neck 9/13 noted: "Hyperenhancing mass adjacent to the left transverse process of C1 with expansion of the adjacent left obliquus capitis inferior muscle, most consistent with metastatic disease". Pt was to have fiberoptic bronchoscopy on 08/22/2021 but was too weak for interventions.  Subjective: Pt was reluctant to accept trials towards the end of the study and cited fatigue and the reason for this. With encourgement, pt accepted a bolus of advanced solids, but trials were still limited. Assessment / Plan / Recommendation CHL IP CLINICAL IMPRESSIONS 08/23/2021 Clinical Impression Pt presented with oropharyngeal dysphgia characterized by prolonged mastication, reduced lingual retraction, reduced pharyngeal constriction, and reduced anterior laryngeal movement. He demonstrated vallecular residue, posterior pharyngeal wall  residue, and pyriform sinus residue. Residue was improved with a chin tuck combined with a left head position. Penetration (PAS 5) was noted with thin liquids and increased in frequency as the study progressed. Penetration was partly secondary to a mild pharyngeal delay and from pyriform sinus residue. No definitive aspiration was noted during the study while fluoro was on, but aspiration is suspected due to weak coughing noted after episodes of aspiration. This was improved with a chin tuck with left head turn, but pt demonstrated difficulty consistently demonstrating this. A dysphagia 2 diet with nectar thick liquids is recommended at this time, with potential for liberalization with consistency is demonstrated with strategies/GOC change. SLP will follow for dysphagia treatment. SLP Visit Diagnosis Dysphagia, oropharyngeal phase (R13.12) Attention and concentration deficit following -- Frontal lobe and executive function deficit following -- Impact on safety and function Moderate aspiration risk;Mild aspiration risk   CHL IP TREATMENT RECOMMENDATION 08/23/2021 Treatment Recommendations Therapy as outlined in treatment plan below   Prognosis 08/23/2021 Prognosis for Safe Diet Advancement Fair Barriers to Reach Goals Severity of deficits;Time post onset Barriers/Prognosis Comment -- CHL IP DIET RECOMMENDATION 08/23/2021 SLP Diet Recommendations Dysphagia 2 (Fine chop) solids;Nectar thick liquid Liquid Administration via Cup;No straw Medication Administration Crushed with puree Compensations Slow rate;Small sips/bites;Follow solids with liquid;Chin tuck Postural Changes Seated upright at 90 degrees   CHL IP OTHER RECOMMENDATIONS 08/23/2021 Recommended Consults -- Oral Care Recommendations Oral care BID Other Recommendations --   CH[facility] IP FOLLOW UP RECOMMENDATIONS 08/23/2021 Follow up Recommendations Skilled Nursing facility   [facility] IP FREQUENCY AND DURATION 08/23/2021 Speech Therapy Frequency (ACUTE ONLY) min 2x/week Treatment  Duration 2 weeks      CHL IP ORAL PHASE 08/23/2021 Oral Phase Impaired Oral - Pudding Teaspoon -- Oral - Pudding Cup -- Oral - Honey Teaspoon -- Oral - Honey Cup -- Oral - Nectar Teaspoon -- Oral - Nectar Cup -- Oral - Nectar Straw -- Oral - Thin Teaspoon -- Oral - Thin Cup -- Oral -  Thin Straw -- Oral - Puree -- Oral - Mech Soft Impaired mastication Oral - Regular -- Oral - Multi-Consistency -- Oral - Pill -- Oral Phase - Comment --  CH[facility] IP PHARYNGEAL PHASE 08/23/2021 Pharyngeal Phase Impaired Pharyngeal- Pudding Teaspoon -- Pharyngeal -- Pharyngeal- Pudding Cup -- Pharyngeal -- Pharyngeal- Honey Teaspoon -- Pharyngeal -- Pharyngeal- Honey Cup -- Pharyngeal -- Pharyngeal- Nectar Teaspoon -- Pharyngeal -- Pharyngeal- Nectar Cup Reduced tongue base retraction;Pharyngeal residue - valleculae;Pharyngeal residue - pyriform;Pharyngeal residue - posterior pharnyx;Reduced laryngeal elevation;Reduced pharyngeal peristalsis Pharyngeal -- Pharyngeal- Nectar Straw Reduced tongue base retraction;Pharyngeal residue - valleculae;Pharyngeal residue - pyriform;Pharyngeal residue - posterior pharnyx;Reduced laryngeal elevation;Reduced pharyngeal peristalsis Pharyngeal -- Pharyngeal- Thin Teaspoon -- Pharyngeal -- Pharyngeal- Thin Cup Reduced tongue base retraction;Pharyngeal residue - valleculae;Pharyngeal residue - pyriform;Pharyngeal residue - posterior pharnyx;Reduced laryngeal elevation;Reduced pharyngeal peristalsis;Penetration/Aspiration during swallow;Penetration/Apiration after swallow Pharyngeal Material enters airway, CONTACTS cords and not ejected out Pharyngeal- Thin Straw Reduced tongue base retraction;Pharyngeal residue - valleculae;Pharyngeal residue - pyriform;Pharyngeal residue - posterior pharnyx;Reduced laryngeal elevation;Reduced pharyngeal peristalsis;Penetration/Aspiration during swallow Pharyngeal Material enters airway, CONTACTS cords and not ejected out Pharyngeal- Puree Reduced tongue base  retraction;Pharyngeal residue - valleculae;Pharyngeal residue - pyriform;Pharyngeal residue - posterior pharnyx;Reduced laryngeal elevation;Reduced pharyngeal peristalsis Pharyngeal -- Pharyngeal- Mechanical Soft Reduced tongue base retraction;Pharyngeal residue - valleculae;Pharyngeal residue - pyriform;Pharyngeal residue - posterior pharnyx;Reduced laryngeal elevation;Reduced pharyngeal peristalsis Pharyngeal -- Pharyngeal- Regular -- Pharyngeal -- Pharyngeal- Multi-consistency -- Pharyngeal -- Pharyngeal- Pill -- Pharyngeal -- Pharyngeal Comment --  CHL IP CERVICAL ESOPHAGEAL PHASE 08/23/2021 Cervical Esophageal Phase (No Data) Pudding Teaspoon -- Pudding Cup -- Honey Teaspoon -- Honey Cup -- Nectar Teaspoon -- Nectar Cup -- Nectar Straw -- Thin Teaspoon -- Thin Cup -- Thin Straw -- Puree -- Mechanical Soft -- Regular -- Multi-consistency -- Pill -- Cervical Esophageal Comment -- Shanika I. Hardin Negus, [place], [place] Office number [phone] Pager [phone] Horton Marshall 08/23/2021, 9:51 AM              US Abdomen Limited RUQ (LIVER/GB)  Result Date: 08/15/2021 CLINICAL DATA:  Generalized abdominal pain x3 weeks EXAM: ULTRASOUND ABDOMEN LIMITED RIGHT UPPER QUADRANT COMPARISON:  None. FINDINGS: Gallbladder: Sludge and mobile gallbladder stones are seen within the gallbladder fundus. The gallbladder, however, is not distended, there is no gallbladder wall thickening, and no pericholecystic fluid is identified. The sonographic Percell Miller sign is reportedly negative. Common bile duct: Diameter: 4 mm in proximal diameter Liver: No focal lesion identified. Within normal limits in parenchymal echogenicity. Portal vein is patent on color Doppler imaging with normal direction of blood flow towards the liver. Other: None. IMPRESSION: Cholelithiasis without sonographic evidence of acute cholecystitis. Electronically Signed   By: Fidela Salisbury M.D.   On: 08/15/2021 12:05     Procedures:  palliative and pulmonary   Subjective: Seen and examined.  He has no complaints.  He is comfortable.  Discharge Exam: Vitals:   08/30/21 2008 08/31/21 0726  BP: 121/80   Pulse: (!) 119 (!) 113  Resp: 20 (!) 21  Temp: 98.1 F (36.7 C)   SpO2: 95%    Vitals:   08/29/21 1957 08/30/21 1215 08/30/21 2008 08/31/21 0726  BP:  104/67 121/80   Pulse:  (!) 114 (!) 119 (!) 113  Resp:  20 20 (!) 21  Temp:  98.2 F (36.8 C) 98.1 F (36.7 C)   TempSrc:  Oral    SpO2: 94%  95%   Weight:      Height:  General exam: Appears calm and comfortable  Respiratory system: Clear to auscultation. Respiratory effort normal. Cardiovascular system: S1 & S2 heard, RRR. No JVD, murmurs, rubs, gallops or clicks. No pedal edema. Gastrointestinal system: Abdomen is nondistended, soft and nontender. No organomegaly or masses felt. Normal bowel sounds heard. Central nervous system: Alert and oriented. No focal neurological deficits. Extremities: Right BKA   The results of significant diagnostics from this hospitalization (including imaging, microbiology, ancillary and laboratory) are listed below for reference.     Microbiology: Recent Results (from the past 240 hour(s))  SARS Coronavirus 2 by RT PCR (hospital order, performed in [facility] hospital lab) Nasopharyngeal Nasopharyngeal Swab     Status: None   Collection Time: 08/22/21 12:05 PM   Specimen: Nasopharyngeal Swab  Result Value Ref Range Status   SARS Coronavirus 2 NEGATIVE NEGATIVE Final    Comment: (NOTE) SARS-CoV-2 target nucleic acids are NOT DETECTED.  The SARS-CoV-2 RNA is generally detectable in upper and lower respiratory specimens during the acute phase of infection. The lowest concentration of SARS-CoV-2 viral copies this assay can detect is 250 copies / mL. A negative result does not preclude SARS-CoV-2 infection and should not be used as the sole basis for treatment or other patient management decisions.  A negative  result may occur with improper specimen collection / handling, submission of specimen other than nasopharyngeal swab, presence of viral mutation(s) within the areas targeted by this assay, and inadequate number of viral copies (<250 copies / mL). A negative result must be combined with clinical observations, patient history, and epidemiological information.  Fact Sheet for Patients:   StrictlyIdeas.no  Fact Sheet for Healthcare Providers: BankingDealers.co.za  This test is not yet approved or  cleared by the [place] FDA and has been authorized for detection and/or diagnosis of SARS-CoV-2 by FDA under an Emergency Use Authorization (EUA).  This EUA will remain in effect (meaning this test can be used) for the duration of the COVID-19 declaration under Section 564(b)(1) of the Act, 21 U.S.C. section 360bbb-3(b)(1), unless the authorization is terminated or revoked sooner.  Performed at [facility], [place] [address] [place], [place] [address]   Resp Panel by RT-PCR (Flu A&B, Covid) Nasopharyngeal Swab     Status: None   Collection Time: 08/30/21 11:55 AM   Specimen: Nasopharyngeal Swab; Nasopharyngeal(NP) swabs in vial transport medium  Result Value Ref Range Status   SARS Coronavirus 2 by RT PCR NEGATIVE NEGATIVE Final    Comment: (NOTE) SARS-CoV-2 target nucleic acids are NOT DETECTED.  The SARS-CoV-2 RNA is generally detectable in upper respiratory specimens during the acute phase of infection. The lowest concentration of SARS-CoV-2 viral copies this assay can detect is 138 copies/mL. A negative result does not preclude SARS-Cov-2 infection and should not be used as the sole basis for treatment or other patient management decisions. A negative result may occur with  improper specimen collection/handling, submission of specimen other than nasopharyngeal swab, presence of viral mutation(s) within the areas targeted by  this assay, and inadequate number of viral copies(<138 copies/mL). A negative result must be combined with clinical observations, patient history, and epidemiological information. The expected result is Negative.  Fact Sheet for Patients:  EntrepreneurPulse.com.au  Fact Sheet for Healthcare Providers:  IncredibleEmployment.be  This test is no t yet approved or cleared by the [place] FDA and  has been authorized for detection and/or diagnosis of SARS-CoV-2 by FDA under an Emergency Use Authorization (EUA). This EUA will remain  in  effect (meaning this test can be used) for the duration of the COVID-19 declaration under Section 564(b)(1) of the Act, 21 U.S.C.section 360bbb-3(b)(1), unless the authorization is terminated  or revoked sooner.       Influenza A by PCR NEGATIVE NEGATIVE Final   Influenza B by PCR NEGATIVE NEGATIVE Final    Comment: (NOTE) The Xpert Xpress SARS-CoV-2/FLU/RSV plus assay is intended as an aid in the diagnosis of influenza from Nasopharyngeal swab specimens and should not be used as a sole basis for treatment. Nasal washings and aspirates are unacceptable for Xpert Xpress SARS-CoV-2/FLU/RSV testing.  Fact Sheet for Patients: EntrepreneurPulse.com.au  Fact Sheet for Healthcare Providers: IncredibleEmployment.be  This test is not yet approved or cleared by the [place] FDA and has been authorized for detection and/or diagnosis of SARS-CoV-2 by FDA under an Emergency Use Authorization (EUA). This EUA will remain in effect (meaning this test can be used) for the duration of the COVID-19 declaration under Section 564(b)(1) of the Act, 21 U.S.C. section 360bbb-3(b)(1), unless the authorization is terminated or revoked.  Performed at [facility], [place] [address] [place], [place] [address]      Labs: BNP (last 3 results) No results for input(s): BNP in the last  8760 hours. Basic Metabolic Panel: Recent Labs  Lab 08/26/21 0319  NA 135  K 4.4  CL 90*  CO2 37*  GLUCOSE 110*  BUN 15  CREATININE 0.58*  CALCIUM 9.3   Liver Function Tests: No results for input(s): AST, ALT, ALKPHOS, BILITOT, PROT, ALBUMIN in the last 168 hours.  No results for input(s): LIPASE, AMYLASE in the last 168 hours. No results for input(s): AMMONIA in the last 168 hours. CBC: Recent Labs  Lab 08/26/21 0319  WBC 3.2*  HGB 10.8*  HCT 31.2*  MCV 86.4  PLT 23*   Cardiac Enzymes: No results for input(s): CKTOTAL, CKMB, CKMBINDEX, TROPONINI in the last 168 hours. BNP: Invalid input(s): POCBNP CBG: No results for input(s): GLUCAP in the last 168 hours. D-Dimer No results for input(s): DDIMER in the last 72 hours. Hgb A1c No results for input(s): HGBA1C in the last 72 hours. Lipid Profile No results for input(s): CHOL, HDL, LDLCALC, TRIG, CHOLHDL, LDLDIRECT in the last 72 hours. Thyroid function studies No results for input(s): TSH, T4TOTAL, T3FREE, THYROIDAB in the last 72 hours.  Invalid input(s): FREET3 Anemia work up No results for input(s): VITAMINB12, FOLATE, FERRITIN, TIBC, IRON, RETICCTPCT in the last 72 hours. Urinalysis    Component Value Date/Time   COLORURINE YELLOW 04/29/2015 2123   APPEARANCEUR CLEAR 04/29/2015 2123   LABSPEC 1.010 04/29/2015 2123   PHURINE 6.0 04/29/2015 2123   GLUCOSEU NEGATIVE 04/29/2015 2123   HGBUR NEGATIVE 04/29/2015 2123   BILIRUBINUR NEGATIVE 04/29/2015 2123   KETONESUR TRACE (A) 04/29/2015 2123   PROTEINUR NEGATIVE 04/29/2015 2123   UROBILINOGEN 0.2 04/29/2015 2123   NITRITE NEGATIVE 04/29/2015 2123   LEUKOCYTESUR NEGATIVE 04/29/2015 2123   Sepsis Labs Invalid input(s): PROCALCITONIN,  WBC,  LACTICIDVEN Microbiology Recent Results (from the past 240 hour(s))  SARS Coronavirus 2 by RT PCR (hospital order, performed in [place] hospital lab) Nasopharyngeal Nasopharyngeal Swab     Status: None   Collection  Time: 08/22/21 12:05 PM   Specimen: Nasopharyngeal Swab  Result Value Ref Range Status   SARS Coronavirus 2 NEGATIVE NEGATIVE Final    Comment: (NOTE) SARS-CoV-2 target nucleic acids are NOT DETECTED.  The SARS-CoV-2 RNA is generally detectable in upper and lower respiratory specimens during  the acute phase of infection. The lowest concentration of SARS-CoV-2 viral copies this assay can detect is 250 copies / mL. A negative result does not preclude SARS-CoV-2 infection and should not be used as the sole basis for treatment or other patient management decisions.  A negative result may occur with improper specimen collection / handling, submission of specimen other than nasopharyngeal swab, presence of viral mutation(s) within the areas targeted by this assay, and inadequate number of viral copies (<250 copies / mL). A negative result must be combined with clinical observations, patient history, and epidemiological information.  Fact Sheet for Patients:   StrictlyIdeas.no  Fact Sheet for Healthcare Providers: BankingDealers.co.za  This test is not yet approved or  cleared by the [place] FDA and has been authorized for detection and/or diagnosis of SARS-CoV-2 by FDA under an Emergency Use Authorization (EUA).  This EUA will remain in effect (meaning this test can be used) for the duration of the COVID-19 declaration under Section 564(b)(1) of the Act, 21 U.S.C. section 360bbb-3(b)(1), unless the authorization is terminated or revoked sooner.  Performed at [facility], [place] [address], [place], [place] [address]   Resp Panel by RT-PCR (Flu A&B, Covid) Nasopharyngeal Swab     Status: None   Collection Time: 08/30/21 11:55 AM   Specimen: Nasopharyngeal Swab; Nasopharyngeal(NP) swabs in vial transport medium  Result Value Ref Range Status   SARS Coronavirus 2 by RT PCR NEGATIVE NEGATIVE Final    Comment: (NOTE) SARS-CoV-2  target nucleic acids are NOT DETECTED.  The SARS-CoV-2 RNA is generally detectable in upper respiratory specimens during the acute phase of infection. The lowest concentration of SARS-CoV-2 viral copies this assay can detect is 138 copies/mL. A negative result does not preclude SARS-Cov-2 infection and should not be used as the sole basis for treatment or other patient management decisions. A negative result may occur with  improper specimen collection/handling, submission of specimen other than nasopharyngeal swab, presence of viral mutation(s) within the areas targeted by this assay, and inadequate number of viral copies(<138 copies/mL). A negative result must be combined with clinical observations, patient history, and epidemiological information. The expected result is Negative.  Fact Sheet for Patients:  EntrepreneurPulse.com.au  Fact Sheet for Healthcare Providers:  IncredibleEmployment.be  This test is no t yet approved or cleared by the [place] FDA and  has been authorized for detection and/or diagnosis of SARS-CoV-2 by FDA under an Emergency Use Authorization (EUA). This EUA will remain  in effect (meaning this test can be used) for the duration of the COVID-19 declaration under Section 564(b)(1) of the Act, 21 U.S.C.section 360bbb-3(b)(1), unless the authorization is terminated  or revoked sooner.       Influenza A by PCR NEGATIVE NEGATIVE Final   Influenza B by PCR NEGATIVE NEGATIVE Final    Comment: (NOTE) The Xpert Xpress SARS-CoV-2/FLU/RSV plus assay is intended as an aid in the diagnosis of influenza from Nasopharyngeal swab specimens and should not be used as a sole basis for treatment. Nasal washings and aspirates are unacceptable for Xpert Xpress SARS-CoV-2/FLU/RSV testing.  Fact Sheet for Patients: EntrepreneurPulse.com.au  Fact Sheet for Healthcare  Providers: IncredibleEmployment.be  This test is not yet approved or cleared by the [place] FDA and has been authorized for detection and/or diagnosis of SARS-CoV-2 by FDA under an Emergency Use Authorization (EUA). This EUA will remain in effect (meaning this test can be used) for the duration of the COVID-19 declaration under Section 564(b)(1) of the Act,  21 U.S.C. section 360bbb-3(b)(1), unless the authorization is terminated or revoked.  Performed at [facility], [place] [address] [place], [place] [address]      Time coordinating discharge: 45 minutes  SIGNED:   Darliss Cheney, MD  [facility] 08/31/2021, 7:34 AM

## 2021-08-31 NOTE — Progress Notes (Signed)
Patient ordered to discharge to [facility]. Report called to [facility]. Patient leaving with EMS. 6L HFNC and condom cath in place. PIVs remained per receiving nurse's request.

## 2021-08-31 NOTE — Care Management Important Message (Signed)
Important Message  Patient Details  Name: [name] MRN: [id number] Date of Birth: 10/17/1951   Medicare Important Message Given:  Yes     [name] 08/31/2021, 3:05 PM

## 2021-08-31 NOTE — TOC Transition Note (Signed)
Transition of Care [facility]) - CM/SW Discharge Note   Patient Details  Name: [name] MRN: [id number] Date of Birth: 13-Jan-1951  Transition of Care [facility]) CM/SW Contact:  Angelita Ingles, RN Phone Number:[phone]  08/31/2021, 9:03 AM   Clinical Narrative:    Patient to discharge to [facility] [place]. Transportation has been arranged per PTAR. Sister [name] has been updated. No other needs noted. D/c packet is at nurses station.TOC will sign off.  Please call report to [facility] [phone]      Final next level of care: [place] Barriers to Discharge: No Barriers Identified   Patient Goals and CMS Choice Patient states their goals for this hospitalization and ongoing recovery are:: Patient has no response sister states goal is to get strong enough to go to assisted living CMS Medicare.gov Compare Post Acute Care list provided to:: Patient Represenative (must comment) (sisters at bedside [place])    Discharge Placement              Patient chooses bed at:  [facility]) Patient to be transferred to facility by: [facility] Name of family member notified: [name] sister Patient and family notified of of transfer: 08/31/21  Discharge Plan and Services In-house Referral: NA Discharge Planning Services: CM Consult, NA Post Acute Care Choice: NA          DME Arranged: N/A DME Agency: NA       HH Arranged: NA HH Agency: NA        Social Determinants of Health (SDOH) Interventions     Readmission Risk Interventions Readmission Risk Prevention Plan 08/24/2021  Transportation Screening Complete  PCP or Specialist Appt within 5-7 Days Complete  Home Care Screening Complete  Medication Review (RN CM) Referral to Pharmacy  Some recent data might be hidden

## 2021-08-31 NOTE — Care Management Important Message (Signed)
Important Message  Patient Details  Name: [name] MRN: [id number] Date of Birth: [birth year]   Medicare Important Message Given:  Yes  Patient left prior to IM delivery document will be mailed to the patient home address.    Seferina Brokaw 08/31/2021, 3:06 PM

## 2021-08-31 NOTE — Telephone Encounter (Signed)
FYI - patient's sister [name] wanted you to know he has been moved to [facility]

## 2021-08-31 NOTE — Progress Notes (Signed)
Pt was on RA when RT entered room and was saturating 71%. Increased 02 to 9 L to maintain 88% sat per order.

## 2021-09-07 ENCOUNTER — Telehealth: Payer: Self-pay | Admitting: Family Medicine

## 2021-09-08 NOTE — Telephone Encounter (Signed)
I did speak with the family, please send back a sympathy card for me to fill in thank you

## 2021-09-14 ENCOUNTER — Ambulatory Visit: Payer: Medicare Other | Admitting: Pulmonary Disease

## 2021-10-03 NOTE — Telephone Encounter (Signed)
FYI

## 2021-10-03 NOTE — Telephone Encounter (Signed)
[name] sister of patient called to let us know that patient passed away this morning around 9:40.  CB# [phone]

## 2021-10-03 DEATH — deceased

## 2022-09-16 IMAGING — DX DG CHEST 2V
2 series · 2 of 2 positions shown · non-contrast
Comparison: December 29, 2019

CLINICAL DATA: Cough and congestion

EXAM:
CHEST - 2 VIEW

[chest pa]
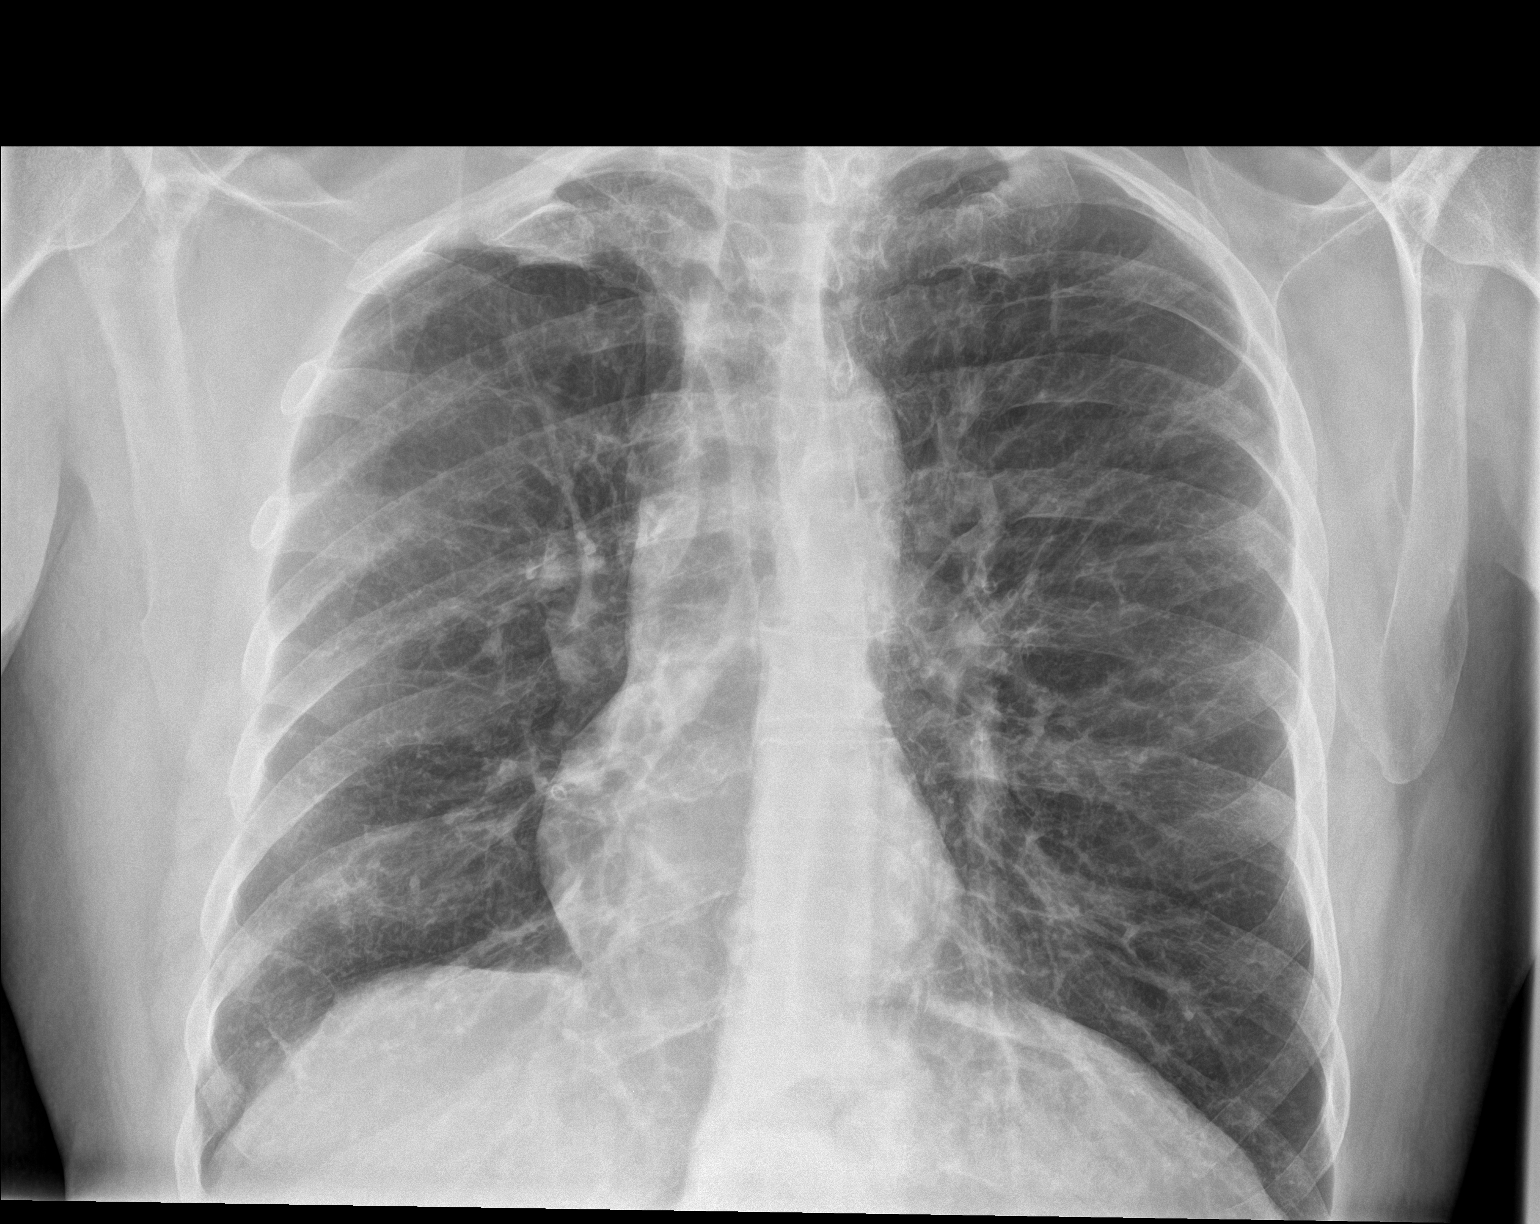

[chest lat]
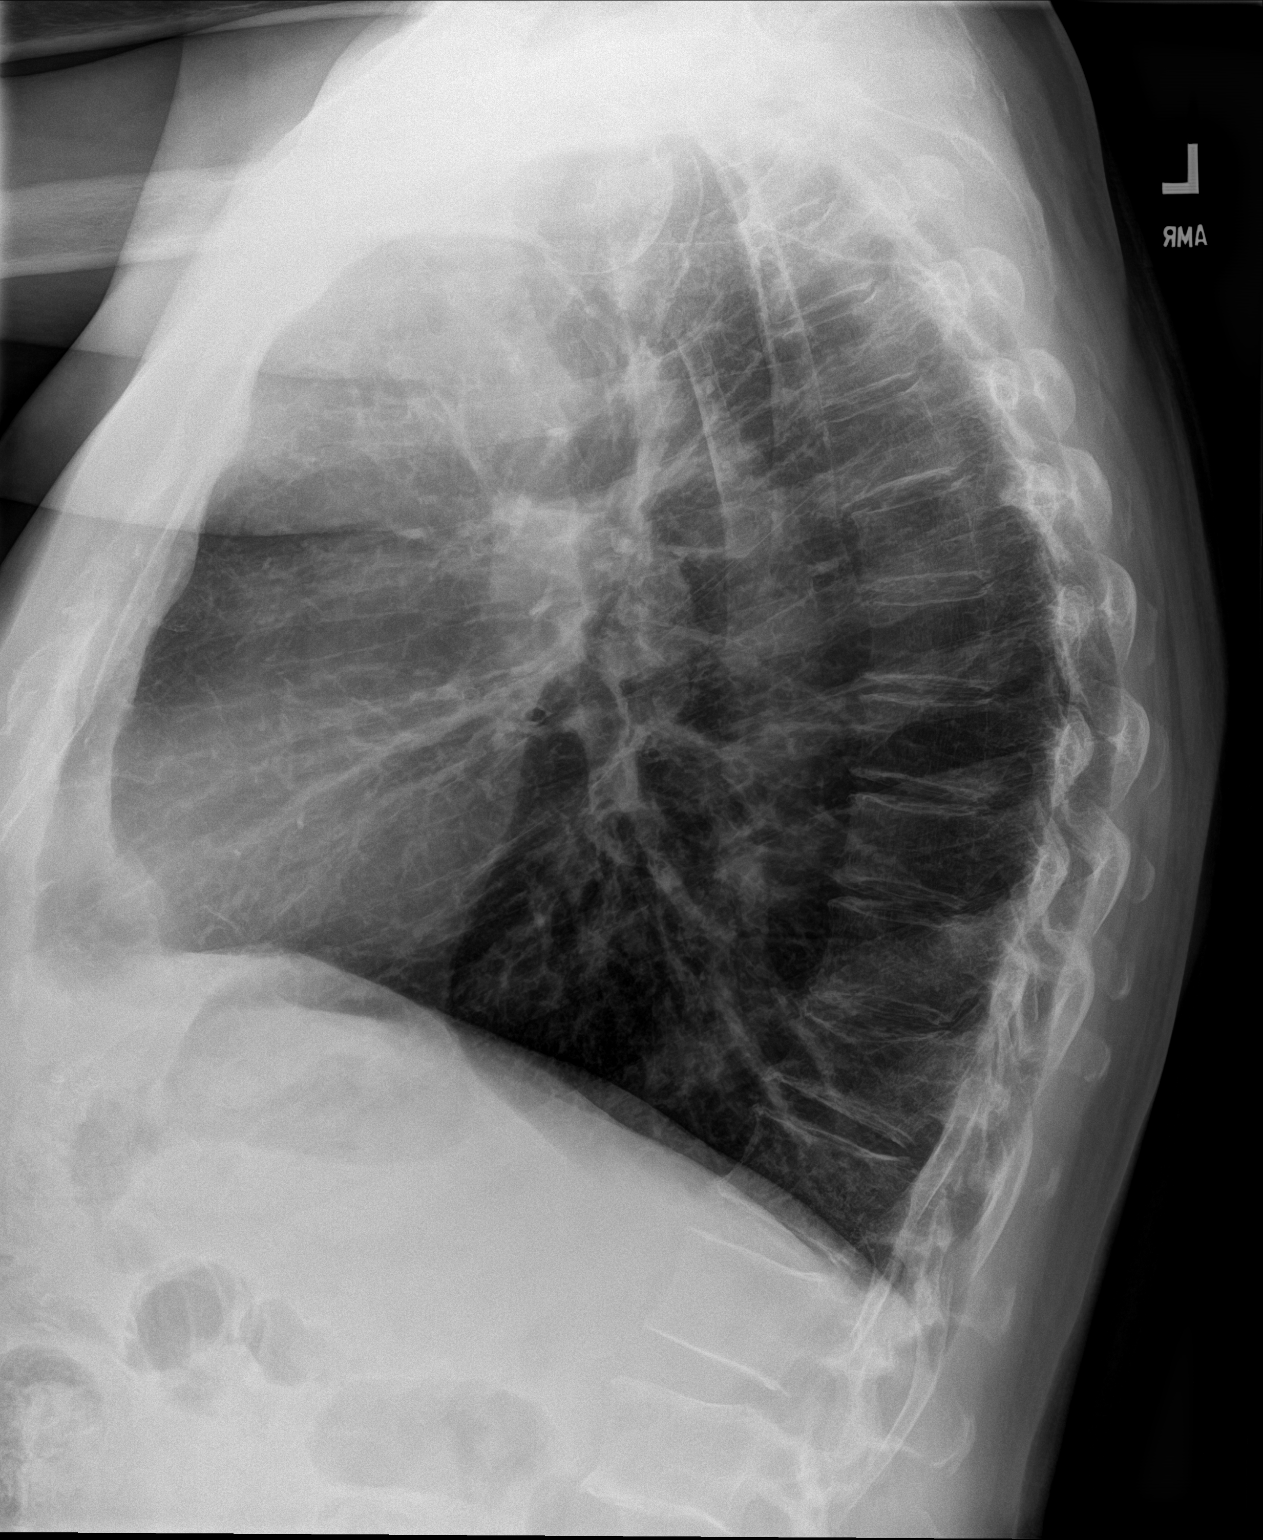

[2 of 2 positions shown; findings below may reference images not displayed]

FINDINGS: There is interstitial thickening throughout the lungs bilaterally.
There is no edema or consolidation appreciable. Heart size and
pulmonary vascularity are normal. No adenopathy. No bone lesions.
IMPRESSION: Somewhat diffuse interstitial thickening, likely due to underlying
chronic bronchitis. No edema or airspace opacity. Heart size normal.

## 2023-01-12 IMAGING — PT NM PET TUM IMG INITIAL (PI) SKULL BASE T - THIGH
1 of 7 series · 1 of 25 positions shown · non-contrast
Comparison: CT scan 07/21/2021

CLINICAL DATA: Initial treatment strategy for non-small cell lung
cancer.

EXAM:
NUCLEAR MEDICINE PET SKULL BASE TO THIGH
TECHNIQUE: 8.58 mCi F-18 FDG was injected intravenously. Full-ring PET imaging
was performed from the skull base to thigh after the radiotracer. CT
data was obtained and used for attenuation correction and anatomic
localization.
Fasting blood glucose: 151 mg/dl

[Series 3: ctac · axial · 5.0mm · 0.98mm/px · 1 of 323 slices shown]
[im 323/323  brain]
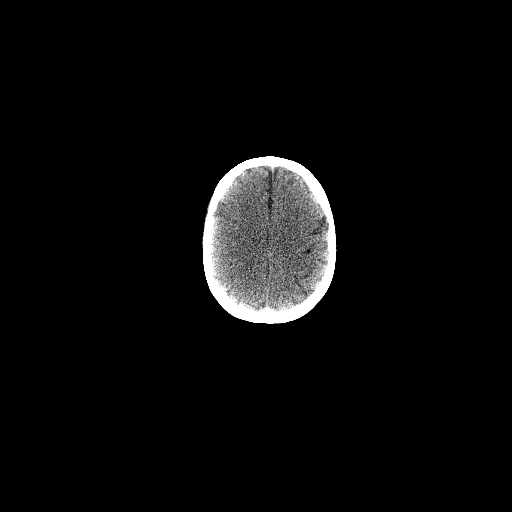

[1 of 25 positions shown; findings below may reference images not displayed]

FINDINGS: Mediastinal blood pool activity: SUV max

Liver activity: SUV max NA

NECK: No hypermetabolic lymph nodes in the neck.

Incidental CT findings: none

CHEST: The right hilar/central lung mass is hypermetabolic with SUV
max of 9.76. Associated right hilar, right paratracheal and
subcarinal lymphadenopathy. The partially necrotic right
paratracheal node has an SUV max of 7.99.

No supraclavicular or axillary adenopathy.

Mild hypermetabolism in the atelectatic right lower lobe is likely
postobstructive pneumonitis.

No findings metastatic pulmonary nodules.

Incidental CT findings: Stable underlying emphysematous changes and
pulmonary scarring. Stable scattered atherosclerotic calcifications
involving the aorta and coronary arteries.

ABDOMEN/PELVIS: No findings to suggest abdominal/pelvic metastatic
disease. No hepatic adrenal gland lesions or.

Incidental CT findings: Numerous small layering gallstones are noted
in the gallbladder. Moderate scattered arterial calcifications.

SKELETON: Diffuse osseous metastatic disease. There is a lytic
lesion involving the right proximal humeral shaft which has an SUV
max 8.35. Right sternal manubrial lesion is destroying the posterior
cortex. SUV max is 7.76. There is also a lytic left-sided sternal
manubrial lesion with SUV max of 28.7.

Destructive lytic lesion involving the transverse process of T7
vertebral body has an SUV max of

Several vertebral body lesions are noted but no spinal canal
compromise.

Right hip and proximal femoral lesions have an SUV max

Incidental CT findings: none
IMPRESSION: 1. Hypermetabolic right hilar mass consistent with known primary
neoplasm. Associated right paratracheal and subcarinal
lymphadenopathy.
2. No findings for abdominal/pelvic metastatic disease.
3. Diffuse osseous metastatic disease as detailed above.
